# Patient Record
Sex: Male | Born: 1965 | Race: White | Hispanic: No | Marital: Single | Smoking: Current some day smoker
Health system: Southern US, Community
[De-identification: ages and names within clinical notes are randomized; demographics above are authoritative.]

## PROBLEM LIST (undated history)

## (undated) DIAGNOSIS — E785 Hyperlipidemia, unspecified: Secondary | ICD-10-CM

## (undated) DIAGNOSIS — C801 Malignant (primary) neoplasm, unspecified: Secondary | ICD-10-CM

## (undated) DIAGNOSIS — F259 Schizoaffective disorder, unspecified: Secondary | ICD-10-CM

## (undated) DIAGNOSIS — C44321 Squamous cell carcinoma of skin of nose: Secondary | ICD-10-CM

## (undated) HISTORY — PX: SKIN BIOPSY: SHX1

## (undated) HISTORY — DX: Squamous cell carcinoma of skin of nose: C44.321

## (undated) HISTORY — DX: Malignant (primary) neoplasm, unspecified: C80.1

## (undated) HISTORY — DX: Schizoaffective disorder, unspecified: F25.9

## (undated) HISTORY — DX: Hyperlipidemia, unspecified: E78.5

---

## 2008-07-14 ENCOUNTER — Ambulatory Visit: Payer: Self-pay | Admitting: Family Medicine

## 2008-07-14 DIAGNOSIS — F259 Schizoaffective disorder, unspecified: Secondary | ICD-10-CM

## 2008-07-14 HISTORY — DX: Schizoaffective disorder, unspecified: F25.9

## 2008-07-15 LAB — CONVERTED CEMR LAB
CO2: 20 meq/L (ref 19–32)
Calcium: 9.6 mg/dL (ref 8.4–10.5)
Chloride: 107 meq/L (ref 96–112)
Eosinophils Relative: 1 % (ref 0–5)
Glucose, Bld: 84 mg/dL (ref 70–99)
HCT: 44.4 % (ref 39.0–52.0)
Hemoglobin: 15.4 g/dL (ref 13.0–17.0)
Lymphocytes Relative: 19 % (ref 12–46)
Lymphs Abs: 2 10*3/uL (ref 0.7–4.0)
Monocytes Absolute: 0.7 10*3/uL (ref 0.1–1.0)
Neutro Abs: 7.3 10*3/uL (ref 1.7–7.7)
Platelets: 161 10*3/uL (ref 150–400)
Sodium: 141 meq/L (ref 135–145)
TSH: 0.835 microintl units/mL (ref 0.350–4.50)
Total Bilirubin: 0.7 mg/dL (ref 0.3–1.2)
Total Protein: 6.6 g/dL (ref 6.0–8.3)
WBC: 10.1 10*3/uL (ref 4.0–10.5)

## 2008-07-22 ENCOUNTER — Ambulatory Visit: Payer: Self-pay | Admitting: Family Medicine

## 2008-07-24 ENCOUNTER — Ambulatory Visit (HOSPITAL_COMMUNITY): Payer: Self-pay | Admitting: Psychiatry

## 2008-08-13 ENCOUNTER — Telehealth: Payer: Self-pay | Admitting: Family Medicine

## 2008-08-18 ENCOUNTER — Ambulatory Visit (HOSPITAL_COMMUNITY): Payer: Self-pay | Admitting: Psychiatry

## 2008-09-02 ENCOUNTER — Encounter: Payer: Self-pay | Admitting: Family Medicine

## 2008-09-02 ENCOUNTER — Telehealth (INDEPENDENT_AMBULATORY_CARE_PROVIDER_SITE_OTHER): Payer: Self-pay | Admitting: *Deleted

## 2008-10-20 ENCOUNTER — Ambulatory Visit (HOSPITAL_COMMUNITY): Payer: Self-pay | Admitting: Psychiatry

## 2008-10-27 ENCOUNTER — Ambulatory Visit: Payer: Self-pay | Admitting: Family Medicine

## 2008-10-27 DIAGNOSIS — R61 Generalized hyperhidrosis: Secondary | ICD-10-CM

## 2008-10-27 LAB — CONVERTED CEMR LAB: Glucose, Bld: 106 mg/dL

## 2008-10-28 ENCOUNTER — Encounter: Payer: Self-pay | Admitting: Family Medicine

## 2008-12-01 ENCOUNTER — Ambulatory Visit (HOSPITAL_COMMUNITY): Payer: Self-pay | Admitting: Psychology

## 2008-12-15 ENCOUNTER — Ambulatory Visit (HOSPITAL_COMMUNITY): Payer: Self-pay | Admitting: Psychiatry

## 2008-12-22 ENCOUNTER — Ambulatory Visit: Payer: Self-pay | Admitting: Family Medicine

## 2008-12-22 DIAGNOSIS — M546 Pain in thoracic spine: Secondary | ICD-10-CM | POA: Insufficient documentation

## 2008-12-22 DIAGNOSIS — R5381 Other malaise: Secondary | ICD-10-CM

## 2008-12-22 DIAGNOSIS — R5383 Other fatigue: Secondary | ICD-10-CM

## 2008-12-23 ENCOUNTER — Encounter: Payer: Self-pay | Admitting: Family Medicine

## 2008-12-24 LAB — CONVERTED CEMR LAB
Folate: 10.7 ng/mL
Glucose, Bld: 95 mg/dL (ref 70–99)
UIBC: 255 ug/dL
Vitamin B-12: 401 pg/mL (ref 211–911)

## 2008-12-30 ENCOUNTER — Ambulatory Visit (HOSPITAL_COMMUNITY): Payer: Self-pay | Admitting: Psychology

## 2009-02-09 ENCOUNTER — Ambulatory Visit (HOSPITAL_COMMUNITY): Payer: Self-pay | Admitting: Psychiatry

## 2009-02-25 ENCOUNTER — Ambulatory Visit (HOSPITAL_COMMUNITY): Payer: Self-pay | Admitting: Psychology

## 2009-03-22 ENCOUNTER — Ambulatory Visit (HOSPITAL_COMMUNITY): Payer: Self-pay | Admitting: Psychology

## 2009-03-25 ENCOUNTER — Ambulatory Visit (HOSPITAL_COMMUNITY): Payer: Self-pay | Admitting: Psychiatry

## 2009-04-12 ENCOUNTER — Ambulatory Visit (HOSPITAL_COMMUNITY): Payer: Self-pay | Admitting: Psychology

## 2009-04-27 ENCOUNTER — Ambulatory Visit (HOSPITAL_COMMUNITY): Payer: Self-pay | Admitting: Psychology

## 2009-05-26 ENCOUNTER — Ambulatory Visit (HOSPITAL_COMMUNITY): Payer: Self-pay | Admitting: Psychiatry

## 2009-08-19 ENCOUNTER — Ambulatory Visit (HOSPITAL_COMMUNITY): Payer: Self-pay | Admitting: Psychiatry

## 2009-11-18 ENCOUNTER — Ambulatory Visit (HOSPITAL_COMMUNITY): Payer: Self-pay | Admitting: Psychiatry

## 2010-01-20 ENCOUNTER — Ambulatory Visit (HOSPITAL_COMMUNITY): Payer: Self-pay | Admitting: Licensed Clinical Social Worker

## 2010-02-17 ENCOUNTER — Ambulatory Visit (HOSPITAL_COMMUNITY): Payer: Self-pay | Admitting: Psychiatry

## 2010-03-31 ENCOUNTER — Ambulatory Visit (HOSPITAL_COMMUNITY): Payer: Self-pay | Admitting: Psychiatry

## 2010-06-03 ENCOUNTER — Ambulatory Visit (HOSPITAL_COMMUNITY): Payer: Self-pay | Admitting: Psychiatry

## 2010-07-14 ENCOUNTER — Ambulatory Visit: Payer: Self-pay | Admitting: Family Medicine

## 2010-07-14 DIAGNOSIS — T148XXA Other injury of unspecified body region, initial encounter: Secondary | ICD-10-CM | POA: Insufficient documentation

## 2010-08-01 ENCOUNTER — Ambulatory Visit (HOSPITAL_COMMUNITY): Payer: Self-pay | Admitting: Psychiatry

## 2010-10-03 ENCOUNTER — Ambulatory Visit (HOSPITAL_COMMUNITY)
Admission: RE | Admit: 2010-10-03 | Discharge: 2010-10-03 | Payer: Self-pay | Source: Home / Self Care | Attending: Psychiatry | Admitting: Psychiatry

## 2010-10-11 NOTE — Assessment & Plan Note (Signed)
Summary: laceration, minor   Vital Signs:  Patient profile:   45 year old male Height:      70 inches Weight:      200 pounds BMI:     28.80 O2 Sat:      96 % on Room air Temp:     98.7 degrees F oral Pulse rate:   90 / minute BP sitting:   140 / 80  (left arm) Cuff size:   large  Vitals Entered By: Payton Spark CMA (July 14, 2010 11:04 AM)  O2 Flow:  Room air CC: Small laceration from hedge trimmers on ear. Update Tdap.   Primary Care Provider:  [name]  CC:  Small laceration from hedge trimmers on ear. Update Tdap.Ronald [name]  History of Present Illness: 45 yo WM presents for a small laceration on the R ear that occured yesterday.  He was at home and he moved a ladder and his electric hedge trimmer fell on his R ear.  He had bleeding for about 20 min.  Used compression and the bleeding subsided.  He denies any pain, pus or bleeding.  It has been > 10 yrs since his last tetanus shot.  Current Medications (verified): 1)  Saphris 5 Mg Subl (Asenapine Maleate) .Ronald [name].. 1 By Mouth Bid 2)  Fluoxetine Hcl 20 Mg Caps (Fluoxetine Hcl) .... Take 3 Caps By Mouth Once Daily  Allergies (verified): No Known Drug Allergies  Past History:  Past Medical History: Reviewed history from 07/14/2008 and no changes required. None  Social History: Reviewed history from 07/14/2008 and no changes required. Ronald [name] for about 1.5 years. MBA degree. Single.  Lives with his mother.  Current Smoker Alcohol use-yes Drug use-no Regular exercise-yes  Review of Systems      See HPI  Physical Exam  General:  alert, well-developed, well-nourished, and well-hydrated.   Head:  normocephalic and atraumatic.   Ears:  small well approximated laceration over the R superior anti helix.  No bleeding, edema, purulent drainage.   Impression & Recommendations:  Problem # 1:  LACERATION (ICD-879.8) R anti helix (ear) laceration, small, occured yesterday without need for suturre.  Appears  clean today. Tdap given today. Wound care instructions given (see pt h/o info) and advised to call if any sign of infection.  Complete Medication List: 1)  Saphris 5 Mg Subl (Asenapine maleate) .Ronald [name].. 1 by mouth bid 2)  Fluoxetine Hcl 20 Mg Caps (Fluoxetine hcl) .... Take 3 caps by mouth once daily  Other Orders: Tdap => 47yrs IM (47829) Admin 1st Vaccine (56213)  Patient Instructions: 1)  Tdap updated today. 2)  Keep wound clean - antibacterial soap and water 1-2 x a day. 3)  Use topical neosporin ointment 2 x a day. 4)  Call if any fevers, redness, pus, pain, swelling occurs.   Orders Added: 1)  Est. Patient Level II [08657] 2)  Tdap => 10yrs IM [90715] 3)  Admin 1st Vaccine [84696]   Immunizations Administered:  Tetanus Vaccine:    Vaccine Type: Tdap    Site: left deltoid    Dose: 0.5 ml    Route: IM    Given by: Payton Spark CMA    Exp. Date: 06/30/2012    Lot #: [id number]    VIS given: 07/29/08 version given July 14, 2010.   Immunizations Administered:  Tetanus Vaccine:    Vaccine Type: Tdap    Site: left deltoid    Dose: 0.5 ml    Route: IM  Given by: Payton Spark CMA    Exp. Date: 06/30/2012    Lot #: [id number]    VIS given: 07/29/08 version given July 14, 2010.

## 2011-01-02 ENCOUNTER — Encounter (INDEPENDENT_AMBULATORY_CARE_PROVIDER_SITE_OTHER): Payer: Self-pay | Admitting: Psychiatry

## 2011-01-02 DIAGNOSIS — F259 Schizoaffective disorder, unspecified: Secondary | ICD-10-CM

## 2011-01-10 ENCOUNTER — Ambulatory Visit (INDEPENDENT_AMBULATORY_CARE_PROVIDER_SITE_OTHER): Payer: Self-pay | Admitting: Psychology

## 2011-01-10 DIAGNOSIS — F259 Schizoaffective disorder, unspecified: Secondary | ICD-10-CM

## 2011-01-25 ENCOUNTER — Encounter (HOSPITAL_COMMUNITY): Payer: Self-pay | Admitting: Psychology

## 2011-02-01 ENCOUNTER — Encounter (HOSPITAL_COMMUNITY): Payer: Self-pay | Admitting: Psychology

## 2011-02-13 ENCOUNTER — Encounter (HOSPITAL_COMMUNITY): Payer: Self-pay | Admitting: Psychology

## 2011-04-03 ENCOUNTER — Encounter (HOSPITAL_COMMUNITY): Payer: Self-pay | Admitting: Psychiatry

## 2011-04-20 ENCOUNTER — Encounter (HOSPITAL_COMMUNITY): Payer: Self-pay | Admitting: Psychiatry

## 2011-05-03 ENCOUNTER — Encounter (INDEPENDENT_AMBULATORY_CARE_PROVIDER_SITE_OTHER): Payer: Self-pay | Admitting: Psychiatry

## 2011-05-03 DIAGNOSIS — F259 Schizoaffective disorder, unspecified: Secondary | ICD-10-CM

## 2011-06-28 ENCOUNTER — Encounter (HOSPITAL_COMMUNITY): Payer: Self-pay

## 2011-08-02 ENCOUNTER — Encounter (HOSPITAL_COMMUNITY): Payer: Self-pay | Admitting: Psychiatry

## 2011-08-02 ENCOUNTER — Ambulatory Visit (HOSPITAL_COMMUNITY): Payer: Self-pay | Admitting: Psychiatry

## 2011-08-02 DIAGNOSIS — F259 Schizoaffective disorder, unspecified: Secondary | ICD-10-CM

## 2011-08-02 MED ORDER — ASENAPINE MALEATE 5 MG SL SUBL: 5.0000 mg | SUBLINGUAL_TABLET | Freq: Two times a day (BID) | SUBLINGUAL | Status: DC

## 2011-08-02 NOTE — Progress Notes (Signed)
   [facility] Behavioral Health Follow-up Outpatient Visit  Ronald [name] 09-09-1966   Subjective: The patient is a 45 year old male who has been followed by [facility] since November of 2009. He is currently diagnosed with schizoaffective disorder. Current medications include Saphris and Prozac. The patient still continues to look for work. He applied it one bank but was rejected the next day. The patient wonders if this relates to a DUI that he got approximately 20 years ago. The patient has been working on a paper related to the [employer]. He is hoping to get this publish at some point. 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 And would like to school to do this. The patient denies any anxiety. He says that his thoughts are making sense. He is up 10 pounds since last visit. He feels like his crept up on him very quickly. He endorses good sleep and appetite. He is asking about the vaporizer cigarette substitution to get for his mother for Christmas.  Filed Vitals:   08/02/11 1026  BP: 138/84    Mental Status Examination  Appearance: Casually dressed Alert: Yes Attention: fair  Cooperative: Yes Eye Contact: Good Speech: Rather monotone Psychomotor Activity: Normal Memory/Concentration: Intact Oriented: person, place, time/date and situation Mood: Euthymic Affect: Restricted Thought Processes and Associations: Logical Fund of Knowledge: Fair Thought Content: No suicidal or homicidal thoughts Insight: Fair Judgement: Fair  Diagnosis: Schizoaffective disorder  Treatment Plan: At this point we will continue the patient on his Saphris and Prozac. I have given him a voucher for a one-month supply free. Pharmacy is to fax me when he needs more Prozac. I will see him back in 3 months.  Jamse Mead, MD

## 2011-11-02 ENCOUNTER — Encounter (HOSPITAL_COMMUNITY): Payer: Self-pay | Admitting: Psychiatry

## 2011-11-02 ENCOUNTER — Ambulatory Visit (INDEPENDENT_AMBULATORY_CARE_PROVIDER_SITE_OTHER): Payer: Self-pay | Admitting: Psychiatry

## 2011-11-02 DIAGNOSIS — F411 Generalized anxiety disorder: Secondary | ICD-10-CM

## 2011-11-02 DIAGNOSIS — F259 Schizoaffective disorder, unspecified: Secondary | ICD-10-CM

## 2011-11-02 NOTE — Progress Notes (Signed)
   [facility] Behavioral Health Follow-up Outpatient Visit  Ronald [name] 02-01-1966   Subjective: The patient is a 46 year old male who has been followed by [facility] since November of 2009. He is currently diagnosed with schizoaffective disorder. Current medications include Saphris and Prozac. The patient still continues to look for work. He feels like he is developing skills by playing the investment game that he is using online. 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 He checks on a daily. The patient reports he uses a special formula but only takes one to 2 minutes. He spends a lot of time reading online. He said he met during the gym. He is up another 2 pounds today. The patient continues to have night sweats. He will wake up in a cold sweat. He denies any bad dreams. He feels that his anxiety is doing well. He is more positive. His thoughts are making sense. He has not done much more with vocational rehabilitation, although they are still in touch. He is complaining of occasional bouts of indigestion.  Filed Vitals:   11/02/11 1047  BP: 140/90    Mental Status Examination  Appearance: Casually dressed Alert: Yes Attention: fair  Cooperative: Yes Eye Contact: Good Speech: Rather monotone Psychomotor Activity: Normal Memory/Concentration: Intact Oriented: person, place, time/date and situation Mood: Euthymic Affect: Restricted Thought Processes and Associations: Logical Fund of Knowledge: Fair Thought Content: No suicidal or homicidal thoughts Insight: Fair Judgement: Fair  Diagnosis: Schizoaffective disorder  Treatment Plan:  We will decrease his Prozac to 40 mg a day to see if this helps with the night sweats. We will continue his Saphris. I will see him back in 2 months.  Jamse Mead, MD

## 2011-12-06 ENCOUNTER — Other Ambulatory Visit (HOSPITAL_COMMUNITY): Payer: Self-pay | Admitting: Psychiatry

## 2011-12-06 MED ORDER — ASENAPINE MALEATE 5 MG SL SUBL: 5.0000 mg | SUBLINGUAL_TABLET | Freq: Two times a day (BID) | SUBLINGUAL | Status: DC

## 2011-12-28 ENCOUNTER — Telehealth (HOSPITAL_COMMUNITY): Payer: Self-pay

## 2012-01-02 ENCOUNTER — Ambulatory Visit (INDEPENDENT_AMBULATORY_CARE_PROVIDER_SITE_OTHER): Payer: Self-pay | Admitting: Psychiatry

## 2012-01-02 ENCOUNTER — Encounter (HOSPITAL_COMMUNITY): Payer: Self-pay | Admitting: Psychiatry

## 2012-01-02 VITALS — BP 121/66 | Ht 70.0 in | Wt 213.0 lb

## 2012-01-02 NOTE — Progress Notes (Signed)
   [facility] Behavioral Health Follow-up Outpatient Visit  Ronald [name] 03-12-1966   Subjective: The patient is a 46 year old male who has been followed by [facility] since November of 2009. He is currently diagnosed with schizoaffective disorder. Current medications include Saphris and Prozac. The patient still continues to look for work. 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 He is down 1 pound today. He reports his been cutting back on the calories, but doesn't seem to be losing weight. He is social with his family, but has no friends. He does walk the dog at night. He is thinking about adding morning walks to see if that helps. He was in a position he was going to apply for, but states that he is too heavy to fit in his seat. I suggested that he go to good will to look for suits. He is doing well with the decrease in Prozac. He feels that his thoughts are making sense. We further discussed his called for jury duty. I advised the patient that if this occued prior to medication, I would have definite concerns. If he starts to have doubts before May second, he is to call me. At that point I will write him a letter excusing him from jury duty. Filed Vitals:   01/02/12 1120  BP: 121/66    Mental Status Examination  Appearance: Casually dressed Alert: Yes Attention: fair  Cooperative: Yes Eye Contact: Good Speech: Rather monotone Psychomotor Activity: Normal Memory/Concentration: Intact Oriented: person, place, time/date and situation Mood: Euthymic Affect: Restricted Thought Processes and Associations: Logical Fund of Knowledge: Fair Thought Content: No suicidal or homicidal thoughts Insight: Fair Judgement: Fair  Diagnosis: Schizoaffective disorder  Treatment Plan: We'll not make any changes. I will see him back in 3 months.  Jamse Mead, MD

## 2012-01-02 NOTE — Telephone Encounter (Signed)
Patient is interested in pursuing jury duty. I believe it would get him out of the house. I don't have any concerns.

## 2012-01-25 ENCOUNTER — Telehealth (HOSPITAL_COMMUNITY): Payer: Self-pay

## 2012-01-26 ENCOUNTER — Other Ambulatory Visit (HOSPITAL_COMMUNITY): Payer: Self-pay | Admitting: Psychiatry

## 2012-01-26 MED ORDER — ASENAPINE MALEATE 5 MG SL SUBL: 5.0000 mg | SUBLINGUAL_TABLET | Freq: Two times a day (BID) | SUBLINGUAL | Status: DC

## 2012-01-26 NOTE — Telephone Encounter (Signed)
Spoke to [place] in pharmacy. She is obtaining medication. I will call patient limits available.

## 2012-04-02 ENCOUNTER — Ambulatory Visit (INDEPENDENT_AMBULATORY_CARE_PROVIDER_SITE_OTHER): Payer: Self-pay | Admitting: Psychiatry

## 2012-04-02 ENCOUNTER — Encounter (HOSPITAL_COMMUNITY): Payer: Self-pay | Admitting: Psychiatry

## 2012-04-02 VITALS — BP 138/90 | Ht 70.0 in | Wt 214.0 lb

## 2012-04-02 DIAGNOSIS — F259 Schizoaffective disorder, unspecified: Secondary | ICD-10-CM

## 2012-04-02 NOTE — Progress Notes (Signed)
   [facility] Behavioral Health Follow-up Outpatient Visit  Ronald [name] 06-Jun-1966    Subjective: The patient is a 46 year old male who has been followed by [facility] since November of 2009. He is currently diagnosed with schizoaffective disorder. Current medications include Saphris and Prozac. At last appointment, I did not make any changes. The patient presents today. He ended up being excused from jury duty, so he did not have to worry. 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 He is thinking that when it is published, he will be able to get a job in [employer]. He sees his sister on weekends. He has not had any more night sweats. He first reports he thinks his thoughts are doing well, but then reports some of his thoughts. He states that he is not sure whether or not they're fact or fiction. He feels that someone informed him that he got a substantial number of votes in the 1992 presidential election. He states he has no way to verify this. 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 He reports that his mind plays tricks and will link together into a conspiracy theory. He to fill the holes in his thoughts. He states that he has no one to talk to, which makes things more difficult. He is asking for a therapist.        Ceasar Mons Vitals:   04/02/12 1118  BP: 138/90    Mental Status Examination  Appearance: Casually dressed Alert: Yes Attention: fair  Cooperative: Yes Eye Contact: Good Speech: Rather monotone Psychomotor Activity: Normal Memory/Concentration: Intact Oriented: person, place, time/date and situation Mood: Euthymic Affect: Restricted Thought Processes and Associations: Logical Fund of Knowledge: Fair Thought Content: No suicidal or homicidal thoughts Insight: Fair Judgement: Fair  Diagnosis: Schizoaffective disorder  Treatment Plan: We'll not make any  changes. I will see him back in 3 months. Patient is reapplying for financial assistance. I will schedule him with Tasia Catchings once this was completed.  Jamse Mead, MD

## 2012-04-16 ENCOUNTER — Other Ambulatory Visit (HOSPITAL_COMMUNITY): Payer: Self-pay

## 2012-04-16 NOTE — Telephone Encounter (Signed)
Pharmacy will be sending 100 count. Will call patient when they're ready.

## 2012-04-23 ENCOUNTER — Other Ambulatory Visit (HOSPITAL_COMMUNITY): Payer: Self-pay | Admitting: Psychiatry

## 2012-04-23 MED ORDER — ASENAPINE MALEATE 5 MG SL SUBL: 5.0000 mg | SUBLINGUAL_TABLET | Freq: Two times a day (BID) | SUBLINGUAL | Status: DC

## 2012-06-06 ENCOUNTER — Other Ambulatory Visit (HOSPITAL_COMMUNITY): Payer: Self-pay

## 2012-06-07 ENCOUNTER — Telehealth (HOSPITAL_COMMUNITY): Payer: Self-pay | Admitting: Psychiatry

## 2012-06-07 NOTE — Telephone Encounter (Signed)
The patient reports he is doing well on his current combination of medications. He denies any SI/HI/AVH or medication side effects. He states he has enough medications to go past the weekend.  PLAN: Will call patient when samples of Saphris arrive.

## 2012-06-10 MED ORDER — ASENAPINE MALEATE 5 MG SL SUBL: 5.0000 mg | SUBLINGUAL_TABLET | Freq: Two times a day (BID) | SUBLINGUAL | Status: DC

## 2012-06-10 NOTE — Telephone Encounter (Signed)
Medication obtained. We'll notify patient.

## 2012-07-03 ENCOUNTER — Encounter (HOSPITAL_COMMUNITY): Payer: Self-pay | Admitting: Psychiatry

## 2012-07-03 ENCOUNTER — Ambulatory Visit (INDEPENDENT_AMBULATORY_CARE_PROVIDER_SITE_OTHER): Payer: Self-pay | Admitting: Psychiatry

## 2012-07-03 VITALS — BP 136/85 | Ht 70.0 in | Wt 215.0 lb

## 2012-07-03 DIAGNOSIS — F259 Schizoaffective disorder, unspecified: Secondary | ICD-10-CM

## 2012-07-03 MED ORDER — FLUOXETINE HCL 20 MG PO CAPS: 40.0000 mg | ORAL_CAPSULE | Freq: Every day | ORAL | Status: DC

## 2012-07-03 NOTE — Progress Notes (Signed)
   [facility] Behavioral Health Follow-up Outpatient Visit  Ronald [name] 1966/07/20    Subjective: The patient is a 46 year old male who has been followed by [facility] since November of 2009. He is currently diagnosed with schizoaffective disorder. Current medications include Saphris and Prozac. At last appointment, I did not make any changes. He presents today. He still has not completed his application for financial assistance. He continues not to work. 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 He doesn't feel he can do this, because he obtained the information he used from a website. He doesn't think he can financially profit from information provided by outside sources. 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 He wants to look at jobs. He is found a couple of positions at [employer]. 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 He denies having any bizarre thoughts. He started smoking again, but quit again soon after. He is concerned about his weight being up. He is up 1 pound today. He feels that his anxiety is under control. He's thought about going off his Prozac secondary to cost. I have discouraged this. I continue to provide samples of his antipsychotic.       Filed Vitals:   07/03/12 1204  BP: 136/85    Mental Status Examination  Appearance: Casually dressed Alert: Yes Attention: fair  Cooperative: Yes Eye Contact: Good Speech: Rather monotone Psychomotor Activity: Normal Memory/Concentration: Intact Oriented: person, place, time/date and situation Mood: Euthymic Affect: Restricted Thought Processes and Associations: Logical Fund of Knowledge: Fair Thought Content: No suicidal or homicidal thoughts Insight: Fair Judgement: Fair  Diagnosis: Schizoaffective disorder  Treatment Plan: We'll not make any  changes. I will see him back in 3 months. Patient may call with concerns.  Jamse Mead, MD

## 2012-07-30 ENCOUNTER — Other Ambulatory Visit (HOSPITAL_COMMUNITY): Payer: Self-pay | Admitting: Psychiatry

## 2012-07-30 MED ORDER — ASENAPINE MALEATE 5 MG SL SUBL: 5.0000 mg | SUBLINGUAL_TABLET | Freq: Two times a day (BID) | SUBLINGUAL | Status: DC

## 2012-09-09 ENCOUNTER — Telehealth (HOSPITAL_COMMUNITY): Payer: Self-pay

## 2012-09-10 NOTE — Telephone Encounter (Signed)
Have contacted pharmacy. Will pickup in the morning.

## 2012-09-13 ENCOUNTER — Other Ambulatory Visit (HOSPITAL_COMMUNITY): Payer: Self-pay | Admitting: Psychiatry

## 2012-09-13 MED ORDER — ASENAPINE MALEATE 5 MG SL SUBL: 5.0000 mg | SUBLINGUAL_TABLET | Freq: Two times a day (BID) | SUBLINGUAL | Status: DC

## 2012-10-03 ENCOUNTER — Ambulatory Visit (INDEPENDENT_AMBULATORY_CARE_PROVIDER_SITE_OTHER): Payer: Self-pay | Admitting: Psychiatry

## 2012-10-03 ENCOUNTER — Encounter (HOSPITAL_COMMUNITY): Payer: Self-pay | Admitting: Psychiatry

## 2012-10-03 VITALS — BP 128/82 | Ht 70.0 in | Wt 209.0 lb

## 2012-10-03 DIAGNOSIS — F259 Schizoaffective disorder, unspecified: Secondary | ICD-10-CM

## 2012-10-03 NOTE — Progress Notes (Signed)
   [facility] Behavioral Health Follow-up Outpatient Visit  ORY ELTING 1966-02-08  Subjective: The patient is a 47 year old male who has been followed by [facility] since November of 2009. He is diagnosed with cues of affective disorder. He has been stable on Saphris and Prozac. At his last appointment, I did not make any changes. He presents today. He is smoking again. He is smoking his mother cigarettes, which makes her very unhappy. He is down 6 pounds. He is up to 16 cigarettes a day. He reports his paper regarding his financial formula is almost completed. He plans on sending it to [employer]. He called someone and got Elissa Hefty e-mail address. The patient has tried for a few financial positions. He was screened over the phone and not given an interview. 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 He endorses good sleep. There has been some mild anxiety. He still has not filled out his financial paperwork. He has an area on the left side of his nose that he has been a look at. 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 It has been there since the summer. He reports that he has not had it looked at by his primary care physician. He has not seen his primary care physician in over 2 years. He still has not filled out his financial paperwork for financial assistance. The patient believes his thoughts are making sense to him. Appetite is down secondary to smoking.    Filed Vitals:   10/03/12 1122  BP: 128/82    Mental Status Examination  Appearance: Casually dressed Alert: Yes Attention: fair  Cooperative: Yes Eye Contact: Good Speech: Rather monotone Psychomotor Activity: Normal Memory/Concentration: Intact Oriented: person, place, time/date and situation Mood: Euthymic Affect: Restricted Thought Processes and Associations: Logical Fund of Knowledge: Fair Thought Content: No suicidal or homicidal thoughts Insight:  Fair Judgement: Fair  Diagnosis: Schizoaffective disorder  Treatment Plan:I will not make any changes. I will see him back in 2 months. Patient may call with concerns. Patient is to fill out paperwork for assistance. I have advised that he needs to have the area on his nose examed.  Jamse Mead, MD

## 2012-10-21 ENCOUNTER — Telehealth (HOSPITAL_COMMUNITY): Payer: Self-pay

## 2012-10-21 NOTE — Telephone Encounter (Signed)
NEEDS SAPHRIS

## 2012-10-21 NOTE — Telephone Encounter (Signed)
I have spoken to [facility] in pharmacy who will send samples. We'll notify patient when they are available.

## 2012-10-30 ENCOUNTER — Telehealth (HOSPITAL_COMMUNITY): Payer: Self-pay

## 2012-10-30 NOTE — Telephone Encounter (Signed)
Called patient. Patient will pick up tomorrow.

## 2012-10-30 NOTE — Telephone Encounter (Signed)
Spoke to pharmacy. Sydell Axon will deliver it today.

## 2012-10-31 ENCOUNTER — Other Ambulatory Visit (HOSPITAL_COMMUNITY): Payer: Self-pay | Admitting: Psychiatry

## 2012-10-31 MED ORDER — ASENAPINE MALEATE 5 MG SL SUBL: 5.0000 mg | SUBLINGUAL_TABLET | Freq: Two times a day (BID) | SUBLINGUAL | Status: DC

## 2012-12-02 ENCOUNTER — Encounter (HOSPITAL_COMMUNITY): Payer: Self-pay | Admitting: Psychiatry

## 2012-12-02 ENCOUNTER — Ambulatory Visit (INDEPENDENT_AMBULATORY_CARE_PROVIDER_SITE_OTHER): Payer: Self-pay | Admitting: Psychiatry

## 2012-12-02 VITALS — BP 125/85 | Ht 70.0 in | Wt 206.0 lb

## 2012-12-02 DIAGNOSIS — F411 Generalized anxiety disorder: Secondary | ICD-10-CM

## 2012-12-02 DIAGNOSIS — F259 Schizoaffective disorder, unspecified: Secondary | ICD-10-CM

## 2012-12-02 MED ORDER — FLUOXETINE HCL 20 MG PO CAPS: 40.0000 mg | ORAL_CAPSULE | Freq: Every day | ORAL | Status: DC

## 2012-12-02 NOTE — Progress Notes (Signed)
   [facility] Behavioral Health Follow-up Outpatient Visit  JERIC SLAGEL Jan 18, 1966   Subjective: The patient is a 47 year old male who has been followed by [facility] since November of 2009. He is currently diagnosed with schizoaffective disorder. He has been stable on Saphris and Prozac. He presents today. He continues to smoke tobacco. He is down 3 pounds. He is smoking his mother cigarettes. She is eager for him to find something to do. She is putting pressure on him. The patient never heard back regarding the position at [employer] in the [employer]. He plans on reapplying. The patient has been staying to himself. He is very concerned about vocational rehabilitation. He has not been there for over 2 years. At his last appointment with them, they wanted him to fill out paperwork so he could have a computer at his house. This would enable him to work from home. He became concerned and paranoid that charge him if he did not get a job. He is paranoid now about going back. He is afraid that they will take out charges against him because he has not returned. The majority of the appointment to spend and reassurance. The patient never heard back about his financial plan. He is still trying to contact musicians to look for prior money that is a owed. Filed Vitals:   12/02/12 1111  BP: 125/85    Mental Status Examination  Appearance: Casually dressed Alert: Yes Attention: good  Cooperative: Yes Eye Contact: Good Speech: Regular rate rhythm and volume Psychomotor Activity: Normal Memory/Concentration: Intact Oriented: person, place, time/date and situation Mood: Euthymic Affect: Constricted Thought Processes and Associations: Linear Fund of Knowledge: Fair Thought Content: No suicidal or homicidal thoughts Insight: Fair Judgement: Fair  Diagnosis: Schizoaffective disorder  Treatment Plan: I will continue his Prozac and Saphris. I will see him back in 2  months. Patient is to be in touch with vocational rehabilitation and have his nose examined prior to next appointment.  Jamse Mead, MD

## 2012-12-23 ENCOUNTER — Other Ambulatory Visit (HOSPITAL_COMMUNITY): Payer: Self-pay | Admitting: Psychiatry

## 2012-12-23 MED ORDER — ASENAPINE MALEATE 5 MG SL SUBL: 5.0000 mg | SUBLINGUAL_TABLET | Freq: Two times a day (BID) | SUBLINGUAL | Status: DC

## 2013-02-04 ENCOUNTER — Ambulatory Visit (INDEPENDENT_AMBULATORY_CARE_PROVIDER_SITE_OTHER): Payer: Self-pay | Admitting: Psychiatry

## 2013-02-04 ENCOUNTER — Encounter (HOSPITAL_COMMUNITY): Payer: Self-pay | Admitting: Psychiatry

## 2013-02-04 VITALS — BP 124/85 | Ht 70.0 in | Wt 204.0 lb

## 2013-02-04 DIAGNOSIS — F411 Generalized anxiety disorder: Secondary | ICD-10-CM

## 2013-02-04 DIAGNOSIS — F259 Schizoaffective disorder, unspecified: Secondary | ICD-10-CM

## 2013-02-04 NOTE — Progress Notes (Signed)
   [facility] Behavioral Health Follow-up Outpatient Visit  Ronald [name] 1965/10/21   Subjective: The patient is a 47 year old male who has been followed by [facility] since November of 2009. He is currently diagnosed with schizoaffective disorder. He has been stable on Saphris and Prozac. He presents today. He continues to smoke tobacco. He is down 2 more pounds. He is smoking his mother cigarettes. She is eager for him to find something to do. The patient has not been back in touch with vocational rehabilitation. 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 He may try to make $10 at each paper. He is planning on giving away copies to get people talking about it. The patient feels that he's more organized. The patient's normal he sleeping well. His had poor sleep the last few nights. His anxiety is doing well. He feels that he has better control of his peers. He has applied for assistance for his co-pay, but has not heard anything. He would like a primary care physician. I have suggested that once he qualifies, he speak to cone about primary care physician. He still needs his nose examined. Filed Vitals:   02/04/13 1052  BP: 124/85   Active Ambulatory Problems    Diagnosis Date Noted  . Schizoaffective disorder 07/14/2008  . BACK PAIN, THORACIC REGION 12/22/2008  . FATIGUE 12/22/2008  . NIGHT SWEATS 10/27/2008  . LACERATION 07/14/2010   Resolved Ambulatory Problems    Diagnosis Date Noted  . No Resolved Ambulatory Problems   No Additional Past Medical History   Current Outpatient Prescriptions on File Prior to Visit  Medication Sig Dispense Refill  . asenapine (SAPHRIS) 5 MG SUBL Place 1 tablet (5 mg total) under the tongue 2 (two) times daily. Samples provided  100 tablet  0  . FLUoxetine (PROZAC) 20 MG capsule Take 2 capsules (40 mg total) by mouth daily.  60 capsule  5   No current facility-administered medications on file  prior to visit.   Review of Systems - General ROS: negative for - sleep disturbance or weight gain Psychological ROS: negative for - anxiety, depression or hallucinations Cardiovascular ROS: no chest pain or dyspnea on exertion Musculoskeletal ROS: negative for - gait disturbance or muscular weakness Neurological ROS: negative for - dizziness, headaches or seizures   Mental Status Examination  Appearance: Casually dressed Alert: Yes Attention: good  Cooperative: Yes Eye Contact: Good Speech: Regular rate rhythm and volume Psychomotor Activity: Normal Memory/Concentration: Intact Oriented: person, place, time/date and situation Mood: Euthymic Affect: Constricted Thought Processes and Associations: Linear Fund of Knowledge: Fair Thought Content: No suicidal or homicidal thoughts Insight: Fair Judgement: Fair  Diagnosis: Schizoaffective disorder  Treatment Plan: I will continue his Prozac and Saphris. I will see him back in 3 months. Patient may call with concerns. Jamse Mead, MD

## 2013-05-07 ENCOUNTER — Encounter (HOSPITAL_COMMUNITY): Payer: Self-pay | Admitting: Psychiatry

## 2013-05-07 ENCOUNTER — Ambulatory Visit (INDEPENDENT_AMBULATORY_CARE_PROVIDER_SITE_OTHER): Payer: Self-pay | Admitting: Psychiatry

## 2013-05-07 VITALS — BP 122/80 | Ht 70.0 in | Wt 204.0 lb

## 2013-05-07 DIAGNOSIS — F259 Schizoaffective disorder, unspecified: Secondary | ICD-10-CM

## 2013-05-07 NOTE — Progress Notes (Signed)
   [facility] Follow-up Outpatient Visit  Ronald [name] 06-28-1966   Subjective: The patient is a 47 year old male who has been followed by [facility] since November of 2009. He is currently diagnosed with schizoaffective disorder. At his last appointment, I did not make any changes. He presents today. 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 Once is completed, he may recontact [employer] and see if they will reconsider his application. The patient continues to smoke his mother cigarettes. He has a dental appointment next week and plans to quit afterwards. He reports a dental office send him a gift certificate for cleaning. He still does not have a primary care physician. He continues to monitor the area on the side of his nose. He feels it may be slightly bigger. The patient reports his thoughts are making sense. He confides in me that he has been off his Prozac for approximately one year. He was feeling better and have less anxiety with the distraction of working on the paper. He feels that he's doing well and his anxiety is under control. He does continue to take the Saphris.  Weight is the same. He is sleeping well. Filed Vitals:   05/07/13 1031  BP: 122/80   Active Ambulatory Problems    Diagnosis Date Noted  . Schizoaffective disorder 07/14/2008  . BACK PAIN, THORACIC REGION 12/22/2008  . FATIGUE 12/22/2008  . NIGHT SWEATS 10/27/2008  . LACERATION 07/14/2010   Resolved Ambulatory Problems    Diagnosis Date Noted  . No Resolved Ambulatory Problems   No Additional Past Medical History   Current Outpatient Prescriptions on File Prior to Visit  Medication Sig Dispense Refill  . asenapine (SAPHRIS) 5 MG SUBL Place 1 tablet (5 mg total) under the tongue 2 (two) times daily. Samples provided  100 tablet  0  . FLUoxetine (PROZAC) 20 MG capsule Take 2 capsules (40 mg total) by mouth daily.  60 capsule  5   No current  facility-administered medications on file prior to visit.   Review of Systems - General ROS: negative for - sleep disturbance or weight gain Psychological ROS: negative for - anxiety, depression or hallucinations Cardiovascular ROS: no chest pain or dyspnea on exertion Musculoskeletal ROS: negative for - gait disturbance or muscular weakness Neurological ROS: negative for - dizziness, headaches or seizures   Mental Status Examination  Appearance: Casually dressed Alert: Yes Attention: good  Cooperative: Yes Eye Contact: Good Speech: Regular rate rhythm and volume Psychomotor Activity: Normal Memory/Concentration: Intact Oriented: person, place, time/date and situation Mood: Euthymic Affect: Constricted Thought Processes and Associations: Linear Fund of Knowledge: Fair Thought Content: No suicidal or homicidal thoughts Insight: Fair Judgement: Fair  Diagnosis: Schizoaffective disorder  Treatment Plan:  I will discontinue the Prozac since patient is been noncompliant. I will continue the Saphris. I will see the patient back in 3 months. Patient may call with concerns. Jamse Mead, MD

## 2013-06-24 ENCOUNTER — Telehealth (HOSPITAL_COMMUNITY): Payer: Self-pay

## 2013-06-24 NOTE — Telephone Encounter (Signed)
Called the pharmacy to request Saphris samples for this patient.  Spoke with the [facility] pharmacist she will send samples to the clinic.

## 2013-07-11 ENCOUNTER — Other Ambulatory Visit (HOSPITAL_COMMUNITY): Payer: Self-pay | Admitting: Psychiatry

## 2013-07-11 MED ORDER — ASENAPINE MALEATE 5 MG SL SUBL: 5.0000 mg | SUBLINGUAL_TABLET | Freq: Two times a day (BID) | SUBLINGUAL | Status: DC

## 2013-08-11 ENCOUNTER — Ambulatory Visit (HOSPITAL_COMMUNITY): Payer: Self-pay | Admitting: Psychiatry

## 2013-08-12 ENCOUNTER — Ambulatory Visit (HOSPITAL_COMMUNITY): Payer: Self-pay | Admitting: Psychiatry

## 2013-08-15 ENCOUNTER — Ambulatory Visit (INDEPENDENT_AMBULATORY_CARE_PROVIDER_SITE_OTHER): Payer: Self-pay | Admitting: Family Medicine

## 2013-08-15 VITALS — BP 159/83 | HR 124 | Temp 98.4°F | Ht 70.0 in

## 2013-08-15 DIAGNOSIS — L989 Disorder of the skin and subcutaneous tissue, unspecified: Secondary | ICD-10-CM

## 2013-08-15 NOTE — Progress Notes (Signed)
   Subjective:    Patient ID: Ronald [name], male    DOB: Dec 31, 1965, 47 y.o.   MRN: [id number]  HPI Sore on the left side of his nose since July of 2013.  Initially started as a small nodule. Eventually became raised and crusty by October of 2013. He says that otherwise doesn't really bother him. It's not painful or itchy. He says it really doesn't bleed. He has not been using any treatments on it.   Review of Systems     Objective:   Physical Exam  Constitutional: He appears well-developed and well-nourished.  HENT:  Head:    Skin:  approx  1 cm crusting lesion on the left lateral side of nose with a crater in the middle.            Assessment & Plan:  Skin lesion on the left side of the nose-very suspicious for squamous cell or basal cell. Because of the size and depth of the lesion I really think he needs to see a dermatologist and maybe even a Mohs surgeon for removal to have the best cosmetic outcome.

## 2013-08-18 ENCOUNTER — Encounter (HOSPITAL_COMMUNITY): Payer: Self-pay | Admitting: Psychiatry

## 2013-08-18 ENCOUNTER — Ambulatory Visit (INDEPENDENT_AMBULATORY_CARE_PROVIDER_SITE_OTHER): Payer: Self-pay | Admitting: Psychiatry

## 2013-08-18 VITALS — BP 129/91 | HR 98 | Ht 70.0 in | Wt 204.0 lb

## 2013-08-18 DIAGNOSIS — F259 Schizoaffective disorder, unspecified: Secondary | ICD-10-CM

## 2013-08-18 NOTE — Progress Notes (Signed)
[facility] [id number] Progress Note  Ronald [name] [id number] 47 y.o.  08/18/2013 10:12 AM  Chief Complaint:  HPI Comments: Ronald [name] is  a 47 y/o male with a past psychiatric history significant for Schizoaffective Disorder. The patient is referred for psychiatric services formedication management.    . Location: The patient reports he has been doing well with his current medications. He is currently receiving Sapharis samples through [facility]. . Quality:  The patient reports that his  main stressors are:  "unemployment"-he is hoping to find a job.  The patient reports that he first came to Dr. Christell Constant around 2011, secondary to paranoia and delusions. He endorses unemployment and financial strains along with poor diet lead to depression with poor sleep, anhedonia, and paranoid ideation.  He reports no previous episodes of psychosis prior to 2011, and believes some of the symptoms were related to his situation (being homeless, inadequate diet, and lack of sleep.)  In the area of affective symptoms, patient appears anxious. Patient denies current suicidal ideation, intent, or plan. Patient denies current homicidal ideation, intent, or plan. Patient denies auditory hallucinations. Patient denies visual hallucinations. Patient denies symptoms of paranoia, but endorses paranoia in the past about people "get him" he is not really sure why. Patient states sleep is good, with approximately 8 hours of sleep per night. Appetite is good. Energy level is fair. Patient denies symptoms of anhedonia. Patient denies hopelessness, helplessness, or guilt.   . Severity: Depression: 6-7/10 (0=Very depressed; 5=Neutral; 10=Very Happy)  Anxiety- 6/10 (0=no anxiety; 5= moderate/tolerable anxiety; 10= panic attacks)  . Duration: Lost job in 2007, mood worsened to 2011 leading to a episode of paranoia in which he called the police and accused them of hypnotizing.   . Timing: No specific time.  .  Context: None currently, in the past worrying about employment.  . Modifying factors: Working on his research paper.   . Associated signs and symptoms: As noted below.   History of Present Illness: Suicidal Ideation: Negative Plan Formed: Negative Patient has means to carry out plan: Negative  Homicidal Ideation: Negative Plan Formed: Negative Patient has means to carry out plan: Negative  Review of Systems: Psychiatric: Agitation: Negative Hallucination: Negative Depressed Mood: Negative Insomnia: Negative Hypersomnia: Negative Altered Concentration: Negative Feels Worthless: Negative Grandiose Ideas: Negative Belief In Special Powers: Negative New/Increased Substance Abuse: Negative Compulsions: Negative  Neurologic: Headache: Negative Seizure: Negative Paresthesias: Negative  Past Medical Family, Social History:  Past Medical History  Diagnosis Date  . Cancer     Skin   Family History  Problem Relation Age of Onset  . Diabetes Mellitus II Brother   . Schizophrenia Maternal Uncle   . Alcohol abuse Neg Hx   . Anxiety disorder Neg Hx   . Bipolar disorder Neg Hx   . Dementia Neg Hx   . Depression Neg Hx   . Drug abuse Neg Hx   . Coronary artery disease Neg Hx    History   Social History  . Marital Status: Single    Spouse Name: N/A    Number of Children: N/A  . Years of Education: N/A   Social History Main Topics  . Smoking status: Current Every Day Smoker -- 1.00 packs/day    Types: Cigarettes    Last Attempt to Quit: 12/10/2008  . Smokeless tobacco: Never Used  . Alcohol Use: 3.0 oz/week    6 drink(s) per week  . Drug Use: No  . Sexual Activity: No   Other  Topics Concern  . None   Social History Narrative  . None    Outpatient Encounter Prescriptions as of 08/18/2013  Medication Sig  . asenapine (SAPHRIS) 5 MG SUBL 24 hr tablet Place 1 tablet (5 mg total) under the tongue 2 (two) times daily. Samples provided    Past Psychiatric  History/Hospitalization(s): Anxiety: Negative Bipolar Disorder: Negative Depression: Negative Mania: Negative Psychosis: Negative Schizophrenia: Negative Personality Disorder: Negative Hospitalization for psychiatric illness: No History of Electroconvulsive Shock Therapy: Negative Prior Suicide Attempts: Negative  .vital Physical Exam: Constitutional: General Appearance: alert, oriented, no acute distress and well nourished  Musculoskeletal: Strength & Muscle Tone: within normal limits Gait & Station: normal Patient leans: N/A  Psychiatric Specialty Exam: General Appearance: Casual and Well Groomed  Patent attorney::  Fair  Speech:  Clear and Coherent and Normal Rate  Volume:  Normal  Mood:  "Anxious" Depression: 6-7/10 (0=Very depressed; 5=Neutral; 10=Very Happy)  Anxiety- 6/10 (0=no anxiety; 5= moderate/tolerable anxiety; 10= panic attacks)    Affect:  Appropriate, Congruent and Full Range  Thought Process:  Coherent, Linear and Logical  Orientation:  Full (Time, Place, and Person)  Thought Content:  WDL  Suicidal Thoughts:  No  Homicidal Thoughts:  No  Memory:  Immediate;   Good Recent;   Good Remote;   Good  Judgement:  Fair  Insight:  Fair  Psychomotor Activity:  Normal  Concentration:  Good  Recall:  Negative  Akathisia:  Negative  Handed:  Right  AIMS (if indicated):   As noted in chart.  Assets:  Communication Skills Desire for Improvement Financial Resources/Insurance Intimacy Leisure Time Physical Health Resilience Social Support Talents/Skills  Sleep:  Number of Hours:    Medical Decision Making (Choose Three): Established Problem, Stable/Improving (1), Review of Psycho-Social Stressors (1), Order AIMS Test (2) and Review of Medication Regimen & Side Effects (2)  Assessment: Axis I: Schizoaffective Disorder; Rule out Delusional Disorder vs major Depressive Disorder, severe with psychotic features.   Plan:  Plan of Care:  PLAN:  1. Affirm  with the patient that the medications are taken as ordered. Patient  expressed understanding of how their medications were to be used.    Laboratory:  No labs warranted at this time.    Psychotherapy: Therapy: brief supportive therapy provided.  Discussed psychosocial stressors in detail.    Medications:  Continue the following psychiatric medications as written prior to this appointment with the following changes::  a) asenapine (SAPHRIS) 5 MG SUBL 24 hr tablet-Place 1 tablet (5 mg total) under the tongue 2 (two) times daily. Will consider tapering of this medication. -Risks and benefits, side effects and alternatives discussed with patient, he was given an opportunity to ask questions about his medication, illness, and treatment. All current psychiatric medications have been reviewed and discussed with the patient and adjusted as clinically appropriate. The patient has been provided an accurate and updated list of the medications being now prescribed.   Routine PRN Medications:  Negative  Consultations: The patient was encouraged to keep all PCP and specialty clinic appointments.   Safety Concerns:   Patient told to call clinic if any problems occur. Patient advised to go to  ER  if he should develop SI/HI, side effects, or if symptoms worsen. Has crisis numbers to call if needed.    Other:   8. Patient was instructed to return to clinic in 1 months.  9. The patient was advised to call and cancel their mental health appointment within 24 hours of  the appointment, if they are unable to keep the appointment, as well as the three no show and termination from clinic policy. 10. The patient expressed understanding of the plan and agrees with the above.   Jacqulyn Cane, M.D.  08/18/2013 10:12 AM

## 2013-08-20 ENCOUNTER — Telehealth (HOSPITAL_COMMUNITY): Payer: Self-pay | Admitting: Psychiatry

## 2013-08-20 NOTE — Telephone Encounter (Signed)
[facility] pharmacy has provided Sapharis 5 mg tablet- 80 tablets samples. Will inform patient of the same.

## 2013-10-01 ENCOUNTER — Encounter (INDEPENDENT_AMBULATORY_CARE_PROVIDER_SITE_OTHER): Payer: Self-pay

## 2013-10-01 ENCOUNTER — Ambulatory Visit (INDEPENDENT_AMBULATORY_CARE_PROVIDER_SITE_OTHER): Payer: Self-pay | Admitting: Psychiatry

## 2013-10-01 ENCOUNTER — Encounter (HOSPITAL_COMMUNITY): Payer: Self-pay | Admitting: Psychiatry

## 2013-10-01 VITALS — BP 135/90 | HR 152 | Wt 199.5 lb

## 2013-10-01 DIAGNOSIS — F259 Schizoaffective disorder, unspecified: Secondary | ICD-10-CM

## 2013-10-01 NOTE — Progress Notes (Signed)
Ronald [name]  Ronald [name] [birth year] ABLE MALLOY [id number] 48 y.o.  10/01/2013 10:32 AM  Chief Complaint:  HPI Comments: Mr. Messmer is  a 48 y/o male with a past psychiatric history significant for Schizoaffective Disorder. The patient is referred for psychiatric services formedication management.    . Location: The patient reports he has been doing well with the decrease in Asenapine. He is currently receiving Sapharis samples through [facility]. . Quality:  The patient reports that his  main stressors are:  "unemployment"-he is hoping to find a job.  The patient reports that he first came to Dr. Laurance Flatten around 2011, secondary to paranoia and delusions. He endorses unemployment and financial strains along with poor diet lead to depression with poor sleep, anhedonia, and paranoid ideation.  He reports no previous episodes of psychosis prior to 2011, and believes some of the symptoms were related to his situation (being homeless, inadequate diet, and lack of sleep.)  In the area of affective symptoms, patient appears anxious. Patient denies current suicidal ideation, intent, or plan. Patient denies current homicidal ideation, intent, or plan. Patient denies auditory hallucinations. Patient denies visual hallucinations. Patient denies symptoms of paranoia, but endorses paranoia in the past about people "get him" he is not really sure why. Patient states sleep is good, with approximately 7-8 hours of sleep per night. Appetite is good. Energy level is fair. Patient denies symptoms of anhedonia. Patient denies hopelessness, helplessness, or guilt.   . Severity: Depression: 6/10 (0=Very depressed; 5=Neutral; 10=Very Happy)  Anxiety- 7-8/10 (0=no anxiety; 5= moderate/tolerable anxiety; 10= panic attacks)  . Duration: Lost job in 2007, mood worsened to 2011 leading to a episode of paranoia in which he called the police and accused them of hypnotizing.    . Timing: No specific time.  . Context: None currently, in the past worrying about employment. Interactions with his sister.  . Modifying factors: Working on his research paper. Looking forward to getting it published  . Associated signs and symptoms: As noted below.  Called his mother for collateral information.  She reports no change or worsening of mood or problems with delusions or hallucinations wotj decrease in dose of Sapharis.  She reports that the patient has been more closed to her.  History of Present Illness: Suicidal Ideation: Negative Plan Formed: Negative Patient has means to carry out plan: Negative  Homicidal Ideation: Negative Plan Formed: Negative Patient has means to carry out plan: Negative  Review of Systems: Psychiatric: Agitation: Yes-Minimal with interactions with his sister. Hallucination: Negative Depressed Mood: Negative Insomnia: Negative Hypersomnia: Negative Altered Concentration: Negative Feels Worthless: Negative Grandiose Ideas: Negative Belief In Special Powers: Negative New/Increased Substance Abuse: Negative Compulsions: Negative  Neurologic: Headache: Negative Seizure: Negative Paresthesias: Negative  Past Medical Family, Social History:  Past Medical History  Diagnosis Date  . Cancer     Skin   Family History  Problem Relation Age of Onset  . Diabetes Mellitus II Brother   . Schizophrenia Maternal Uncle   . Alcohol abuse Neg Hx   . Anxiety disorder Neg Hx   . Bipolar disorder Neg Hx   . Dementia Neg Hx   . Depression Neg Hx   . Drug abuse Neg Hx   . Coronary artery disease Neg Hx    History   Social History  . Marital Status: Single    Spouse Name: N/A    Number of Children: N/A  . Years of Education: N/A   Social  History Main Topics  . Smoking status: Current Every Day Smoker -- 1.00 packs/day    Types: Cigarettes    Last Attempt to Quit: 12/10/2008  . Smokeless tobacco: Never Used  . Alcohol Use: 3.0  oz/week    6 drink(s) per week  . Drug Use: No  . Sexual Activity: No   Other Topics Concern  . Not on file   Social History Narrative  . No narrative on file    Outpatient Encounter Prescriptions as of 10/01/2013  Medication Sig  . asenapine (SAPHRIS) 5 MG SUBL 24 hr tablet Place 1 tablet (5 mg total) under the tongue 2 (two) times daily. Samples provided    Past Psychiatric History/Hospitalization(s): Anxiety: Negative Bipolar Disorder: Negative Depression: Negative Mania: Negative Psychosis: Negative Schizophrenia: Negative Personality Disorder: Negative Hospitalization for psychiatric illness: No History of Electroconvulsive Shock Therapy: Negative Prior Suicide Attempts: Negative   Review of Systems  Constitutional: Negative for fever, chills, weight loss and malaise/fatigue.  HENT: Positive for tinnitus (Left ear and sometime in his right ear.).   Eyes: Negative for blurred vision, double vision and photophobia.  Respiratory: Negative for cough, hemoptysis, sputum production and shortness of breath.   Cardiovascular: Negative for chest pain, palpitations and leg swelling.  Gastrointestinal: Negative for heartburn, nausea, vomiting, abdominal pain, diarrhea and constipation.  Genitourinary: Negative for dysuria and urgency.  Skin: Negative for itching and rash.  Neurological: Negative for dizziness, tingling, focal weakness, seizures, loss of consciousness and headaches.   Filed Vitals:   10/01/13 1046  BP: 135/90  Pulse: 152  Weight: 199 lb 8 oz (90.493 kg)   Physical Exam: Constitutional: General Appearance: alert, oriented, no acute distress and well nourished Musculoskeletal: Strength & Muscle Tone: within normal limits Gait & Station: normal Patient leans: N/A  Psychiatric Specialty Exam: General Appearance: Casual and Well Groomed  Engineer, water::  Fair  Speech:  Clear and Coherent and Normal Rate  Volume:  Normal  Mood:  "okay"  Affect:   Appropriate, Congruent and Full Range  Thought Process:  Coherent, Linear and Logical  Orientation:  Full (Time, Place, and Person)  Thought Content:  WDL  Suicidal Thoughts:  No  Homicidal Thoughts:  No  Memory:  Immediate;   Good Recent;   Good Remote;   Good  Judgement:  Fair  Insight:  Fair  Psychomotor Activity:  Normal  Concentration:  Good  Recall:  Negative  Akathisia:  Negative  Handed:  Right  AIMS (if indicated):   As noted in chart.  Assets:  Communication Skills Desire for Improvement Financial Resources/Insurance Intimacy Leisure Time Physical Health Resilience Social Support Talents/Skills   Medical Decision Making (Choose Three): Established Problem, Stable/Improving (1), Review of Psycho-Social Stressors (1), Order AIMS Test (2) and Review of Medication Regimen & Side Effects (2)  Assessment: Schizoaffective Disorder-stable  Axis I: Schizoaffective Disorder; Rule out Delusional Disorder vs major Depressive Disorder, severe with psychotic features.  Plan:  Plan of Care:  PLAN:  1. Affirm with the patient that the medications are taken as ordered. Patient  expressed understanding of how their medications were to be used.    Laboratory:  No labs warranted at this time. Will need to order FBS, Hba1C, FLP   Psychotherapy: Therapy: brief supportive therapy provided.  Discussed psychosocial stressors in detail. More than 50% of the [name] was spent on individual therapy.   Medications:  Continue the following psychiatric medications as written prior to this appointment with the following changes::  a) Decreased asenapine-Take  5 mg in the morning and 2.5 mg at night.-No further decrease at this time   -Risks and benefits, side effects and alternatives discussed with patient, he was given an opportunity to ask questions about his medication, illness, and treatment. All current psychiatric medications have been reviewed and discussed with the patient and adjusted  as clinically appropriate. The patient has been provided an accurate and updated list of the medications being now prescribed.   Routine PRN Medications:  Negative  Consultations: The patient was encouraged to keep all PCP and specialty clinic appointments.   Safety Concerns:   Patient told to call clinic if any problems occur. Patient advised to go to  ER  if he should develop SI/HI, side effects, or if symptoms worsen. Has crisis numbers to call if needed.    Other:   8. Patient was instructed to return to clinic in  months.  9. The patient was advised to call and cancel their mental health appointment within 24 hours of the appointment, if they are unable to keep the appointment, as well as the three no show and termination from clinic policy. 10. The patient expressed understanding of the plan and agrees with the above.  Time Spent: 30 minutes Coralyn Helling, M.D.  10/01/2013 10:41 AM

## 2013-10-03 MED ORDER — ASENAPINE MALEATE 5 MG SL SUBL: SUBLINGUAL_TABLET | SUBLINGUAL | Status: DC

## 2013-10-20 ENCOUNTER — Telehealth (HOSPITAL_COMMUNITY): Payer: Self-pay

## 2013-10-24 NOTE — Telephone Encounter (Signed)
Will call [facility] for Sapharis

## 2013-10-31 ENCOUNTER — Telehealth (HOSPITAL_COMMUNITY): Payer: Self-pay | Admitting: Psychiatry

## 2013-10-31 ENCOUNTER — Encounter (HOSPITAL_COMMUNITY): Payer: Self-pay | Admitting: Psychiatry

## 2013-10-31 ENCOUNTER — Ambulatory Visit (INDEPENDENT_AMBULATORY_CARE_PROVIDER_SITE_OTHER): Payer: Self-pay | Admitting: Psychiatry

## 2013-10-31 VITALS — BP 113/95 | HR 126 | Wt 192.0 lb

## 2013-10-31 DIAGNOSIS — F259 Schizoaffective disorder, unspecified: Secondary | ICD-10-CM

## 2013-10-31 NOTE — Progress Notes (Signed)
[facility] Follow-up Outpatient Visit  Ronald [name] 13-May-1966 [id number] 48 y.o.  10/31/2013 2:46 PM  Chief Complaint:  HPI Comments: Mr. Frith is  a 48 y/o male with a past psychiatric history significant for Schizoaffective Disorder. The patient is referred for psychiatric services formedication management.    . Location: The patient reports that he has had suicidal thoughts over the past week.  . Quality:  The patient reports that his  main stressors are:  "unemployment"-he is hoping to find a job.  In the area of affective symptoms, patient appears anxious and depressed. Patient denies current suicidal ideation, intent, or plan. Patient denies current homicidal ideation, intent, or plan. Patient denies auditory hallucinations. Patient denies visual hallucinations. Patient denies symptoms of paranoia, but endorses paranoia in the past about people "get him" he is not really sure why. Patient states sleep is poor. Appetite is good. Energy level is fair. Patient denies symptoms of anhedonia. Patient denies hopelessness, helplessness, or guilt.   . Severity: Severe  . Duration:Over the past week.  . Timing: Throughout the day  . Context: Employment  The patient mother reports that   History of Present Illness: Suicidal Ideation: Yes Plan Formed: Yes Patient has means to carry out plan: Yes  Homicidal Ideation: Negative Plan Formed: Negative Patient has means to carry out plan: Negative  Review of Systems: Psychiatric: Agitation: Yes-Minimal with interactions with his sister. Hallucination: Negative Depressed Mood: Yes Insomnia: Negative Hypersomnia: Negative Altered Concentration: Negative Feels Worthless: Negative Grandiose Ideas: Negative Belief In Special Powers: Negative New/Increased Substance Abuse: Negative Compulsions: Negative  Neurologic: Headache: Negative Seizure: Negative Paresthesias: Negative  Past Medical Family, Social  History:  Past Medical History  Diagnosis Date  . Cancer     Skin  . Squamous cell skin cancer, ala nasi    Family History  Problem Relation Age of Onset  . Diabetes Mellitus II Brother   . Schizophrenia Maternal Uncle   . Alcohol abuse Neg Hx   . Anxiety disorder Neg Hx   . Bipolar disorder Neg Hx   . Dementia Neg Hx   . Depression Neg Hx   . Drug abuse Neg Hx   . Coronary artery disease Neg Hx    History   Social History  . Marital Status: Single    Spouse Name: N/A    Number of Children: N/A  . Years of Education: N/A   Social History Main Topics  . Smoking status: Current Every Day Smoker -- 1.00 packs/day    Types: Cigarettes    Last Attempt to Quit: 12/10/2008  . Smokeless tobacco: Never Used  . Alcohol Use: 3.0 oz/week    6 drink(s) per week  . Drug Use: No  . Sexual Activity: No   Other Topics Concern  . Not on file   Social History Narrative  . No narrative on file    Outpatient Encounter Prescriptions as of 10/31/2013  Medication Sig  . asenapine (SAPHRIS) 5 MG SUBL 24 hr tablet Take 5 mg in the morning and 2.5 mg at night.    Past Psychiatric History/Hospitalization(s): Anxiety: Negative Bipolar Disorder: Negative Depression: Negative Mania: Negative Psychosis: Negative Schizophrenia: Negative Personality Disorder: Negative Hospitalization for psychiatric illness: No History of Electroconvulsive Shock Therapy: Negative Prior Suicide Attempts: Negative   Review of Systems  Constitutional: Negative for fever, chills, weight loss and malaise/fatigue.  HENT: Positive for tinnitus (Left ear and sometime in his right ear.).   Eyes: Negative for blurred vision, double  vision and photophobia.  Respiratory: Negative for cough, hemoptysis, sputum production and shortness of breath.   Cardiovascular: Negative for chest pain, palpitations and leg swelling.  Gastrointestinal: Negative for heartburn, nausea, vomiting, abdominal pain, diarrhea and  constipation.  Genitourinary: Negative for dysuria and urgency.  Skin: Negative for itching and rash.  Neurological: Negative for dizziness, tingling, focal weakness, seizures, loss of consciousness and headaches.   Filed Vitals:   10/31/13 1503  BP: 113/95  Pulse: 126  Weight: 192 lb (87.091 kg)    Physical Exam: Constitutional: General Appearance: alert, oriented, no acute distress and well nourished Musculoskeletal: Gait & Station: normal Patient leans: N/A  Psychiatric Specialty Exam: General Appearance: Casual and Well Groomed  Engineer, water::  Fair  Speech:  Clear and Coherent and Normal Rate  Volume:  Normal  Mood:  "okay"  Affect:  Appropriate, Congruent and Full Range  Thought Process:  Coherent, Linear and Logical  Orientation:  Full (Time, Place, and Person)  Thought Content:  WDL  Suicidal Thoughts:  Yes.  with intent/plan  Homicidal Thoughts:  No  Memory:  Immediate;   Good Recent;   Good Remote;   Good  Judgement:  Fair  Insight:  Fair  Psychomotor Activity:  Normal  Concentration:  Good  Recall:  Negative  Akathisia:  Negative  Handed:  Right  AIMS (if indicated):   As noted in chart.  Assets:  Communication Skills Desire for Improvement Financial Resources/Insurance Intimacy Leisure Time Physical Health Resilience Social Support Talents/Skills   Medical Decision Making (Choose Three): Review of Psycho-Social Stressors (1), Order AIMS Test (2), Established Problem, Worsening (2) and Review of Medication Regimen & Side Effects (2)  Assessment: Schizoaffective Disorder-worsening  Axis I: Schizoaffective Disorder; Rule out Delusional Disorder vs major Depressive Disorder, severe with psychotic features.  Plan: Will send patient to ER with his mother. Called patient's mother confirmed that patient has been admitted to [facility]. More than 50% of the visit was spent on individual therapy/counseling.  Time Spent: 30 minutes Coralyn Helling, M.D.   10/31/2013 2:46 PM

## 2013-10-31 NOTE — Telephone Encounter (Signed)
Saw patient today at the clinic. Advised them to go to the ED.

## 2013-11-01 DIAGNOSIS — F329 Major depressive disorder, single episode, unspecified: Secondary | ICD-10-CM | POA: Insufficient documentation

## 2013-11-05 NOTE — Telephone Encounter (Signed)
Called patient's mother back. Left message.

## 2013-11-05 NOTE — Telephone Encounter (Signed)
Called patient's mother. Spoke to her.

## 2013-11-06 ENCOUNTER — Ambulatory Visit (HOSPITAL_COMMUNITY): Payer: Self-pay | Admitting: Psychiatry

## 2013-11-07 ENCOUNTER — Telehealth (HOSPITAL_COMMUNITY): Payer: Self-pay

## 2013-11-07 NOTE — Telephone Encounter (Signed)
Called patient's. She reports she was told by the inpatient treatment team that  support staff (Ms. Olivia Canter) stated Saphris samples would be available indefinitely for the patient. She reports that based on the statement that the patient could receive medications indefinitely, the patient was released from the hospital. Will confirm this statement and make saphris samples available.

## 2013-11-10 NOTE — Telephone Encounter (Signed)
Called pharmacy. Will get samples of medicaiton of Sapharis at this point but will try another.

## 2013-11-11 ENCOUNTER — Ambulatory Visit (INDEPENDENT_AMBULATORY_CARE_PROVIDER_SITE_OTHER): Payer: Self-pay | Admitting: Psychiatry

## 2013-11-11 ENCOUNTER — Encounter (INDEPENDENT_AMBULATORY_CARE_PROVIDER_SITE_OTHER): Payer: Self-pay

## 2013-11-11 DIAGNOSIS — F259 Schizoaffective disorder, unspecified: Secondary | ICD-10-CM

## 2013-11-12 ENCOUNTER — Encounter (HOSPITAL_COMMUNITY): Payer: Self-pay | Admitting: Psychiatry

## 2013-11-12 ENCOUNTER — Ambulatory Visit (INDEPENDENT_AMBULATORY_CARE_PROVIDER_SITE_OTHER): Payer: Self-pay | Admitting: Psychiatry

## 2013-11-12 VITALS — BP 128/85 | HR 100 | Wt 192.0 lb

## 2013-11-12 DIAGNOSIS — F259 Schizoaffective disorder, unspecified: Secondary | ICD-10-CM

## 2013-11-12 MED ORDER — FLUOXETINE HCL 20 MG PO CAPS: 60.0000 mg | ORAL_CAPSULE | Freq: Every day | ORAL | Status: DC

## 2013-11-12 MED ORDER — ASENAPINE MALEATE 10 MG SL SUBL: 10.0000 mg | SUBLINGUAL_TABLET | Freq: Two times a day (BID) | SUBLINGUAL | Status: DC

## 2013-11-12 NOTE — Progress Notes (Signed)
[place] Follow-up Outpatient Visit  Ronald [name] 1966-04-20 [id number] 48 y.o.  11/12/2013 4:39 PM  Chief Complaint:  HPI Comments: Ronald [name] is  a 48 y/o male with a past psychiatric history significant for Schizoaffective Disorder. The patient is referred for psychiatric services formedication management.    . Location: The patient he continues to have anxiety.  He has less paranoia with Saphris. . Quality:  The patient reports that his  main stressors are:  "unemployment"-he is hoping to find a job. 'lack of insurance"-he is currently applying.  In the area of affective symptoms, patient appears anxious and depressed. Patient denies current suicidal ideation, intent, or plan. Patient denies current homicidal ideation, intent, or plan. Patient denies auditory hallucinations. Patient denies visual hallucinations. Patient denies symptoms of paranoia, but endorses paranoia in the past about people "get him" he is not really sure why. Patient states sleep is poor. Appetite is good. Energy level is fair. Patient denies symptoms of anhedonia. Patient denies hopelessness, helplessness, or guilt.   . Severity: Severe  . Duration: Over the past week.  . Timing: Throughout the day  . Context: Employment  The patient mother reports that   History of Present Illness: Suicidal Ideation: Yes Plan Formed: Yes Patient has means to carry out plan: Yes  Homicidal Ideation: Negative Plan Formed: Negative Patient has means to carry out plan: Negative  Review of Systems: Psychiatric: Agitation: Yes-Minimal with interactions with his sister. Hallucination: Negative Depressed Mood: Yes Insomnia: Negative Hypersomnia: Negative Altered Concentration: Negative Feels Worthless: Negative Grandiose Ideas: Negative Belief In Special Powers: Negative New/Increased Substance Abuse: Negative Compulsions: Negative  Neurologic: Headache: Negative Seizure:  Negative Paresthesias: Negative  Past Medical Family, Social History:  Past Medical History  Diagnosis Date  . Cancer     Skin  . Squamous cell skin cancer, ala nasi    Family History  Problem Relation Age of Onset  . Diabetes Mellitus II Brother   . Schizophrenia Maternal Uncle   . Alcohol abuse Neg Hx   . Anxiety disorder Neg Hx   . Bipolar disorder Neg Hx   . Dementia Neg Hx   . Depression Neg Hx   . Drug abuse Neg Hx   . Coronary artery disease Neg Hx    History   Social History  . Marital Status: Single    Spouse Name: N/A    Number of Children: N/A  . Years of Education: N/A   Social History Main Topics  . Smoking status: Current Every Day Smoker -- 1.00 packs/day    Types: Cigarettes    Last Attempt to Quit: 12/10/2008  . Smokeless tobacco: Never Used  . Alcohol Use: 3.0 oz/week    6 drink(s) per week  . Drug Use: No  . Sexual Activity: No   Other Topics Concern  . Not on file   Social History Narrative  . No narrative on file    Outpatient Encounter Prescriptions as of 11/12/2013  Medication Sig  . Asenapine Maleate (SAPHRIS) 10 MG SUBL Place 10 mg under the tongue.  Marland Kitchen FLUoxetine (PROZAC) 20 MG capsule Take 60 mg by mouth.  . traZODone (DESYREL) 100 MG tablet Take 100 mg by mouth at bedtime.  . [DISCONTINUED] asenapine (SAPHRIS) 5 MG SUBL 24 hr tablet Take 5 mg in the morning and 2.5 mg at night.    Past Psychiatric History/Hospitalization(s): Anxiety: Negative Bipolar Disorder: Negative Depression: Negative Mania: Negative Psychosis: Negative Schizophrenia: Negative Personality Disorder: Negative Hospitalization for psychiatric  illness: No History of Electroconvulsive Shock Therapy: Negative Prior Suicide Attempts: Negative   Review of Systems  Constitutional: Negative for fever, chills, weight loss and malaise/fatigue.  HENT: Positive for tinnitus (Left ear and sometime in his right ear.).   Eyes: Negative for blurred vision, double  vision and photophobia.  Respiratory: Negative for cough, hemoptysis, sputum production and shortness of breath.   Cardiovascular: Negative for chest pain, palpitations and leg swelling.  Gastrointestinal: Negative for heartburn, nausea, vomiting, abdominal pain, diarrhea and constipation.  Genitourinary: Negative for dysuria and urgency.  Skin: Negative for itching and rash.  Neurological: Negative for dizziness, tingling, focal weakness, seizures, loss of consciousness and headaches.   Filed Vitals:   11/12/13 1640  BP: 128/85  Pulse: 100  Weight: 192 lb (87.091 kg)    Physical Exam: Constitutional: General Appearance: alert, oriented, no acute distress and well nourished Musculoskeletal: Gait & Station: normal Patient leans: N/A  Psychiatric Specialty Exam: General Appearance: Casual and Well Groomed  Engineer, water::  Fair  Speech:  Clear and Coherent and Normal Rate  Volume:  Normal  Mood:  "anxious about medications."-referring to whether they will be available and affordable  Affect:  Appropriate, Congruent and Full Range  Thought Process:  Coherent, Linear and Logical  Orientation:  Full (Time, Place, and Person)  Thought Content:  WDL  Suicidal Thoughts:  No  Homicidal Thoughts:  No  Memory:  Immediate;   Good Recent;   Good Remote;   Good  Judgement:  Fair  Insight:  Fair  Psychomotor Activity:  Normal  Concentration:  Good  Recall:  Negative  Akathisia:  Negative  Handed:  Right  AIMS (if indicated):   As noted in chart.  Assets:  Communication Skills Desire for Improvement Financial Resources/Insurance Intimacy Leisure Time Physical Health Resilience Social Support Talents/Skills   Medical Decision Making (Choose Three): Review of Psycho-Social Stressors (1), Order AIMS Test (2), Established Problem, Worsening (2) and Review of Medication Regimen & Side Effects (2)  Assessment: Schizoaffective Disorder-improving  Axis I: Schizoaffective Disorder;  Rule out Delusional Disorder vs major Depressive Disorder, severe with psychotic features.  Plan:  Plan of Care:  PLAN:  1. Affirm with the patient that the medications are taken as ordered. Patient  expressed understanding of how their medications were to be used.    Laboratory:  No labs warranted at this time. Will order random drug screens as long as patient is on a scheduled medication.  Psychotherapy: Therapy: brief supportive therapy provided.  Discussed psychosocial stressors in detail.  More than 50% of the visit was spent on individual therapy/counseling.   Medications:  Continue the following psychiatric medications as written prior to this appointment with the following changes::  a) Saphris 10 mg BID-Patient was informed Saphris may not always be available as samples and that e must find a way to get insurance to cover this. b) Prozac 60 mg daily C)  Increase trazodone 150 mg -Risks and benefits, side effects and alternatives discussed with patient, he was given an opportunity to ask questions about his medication, illness, and treatment. All current psychiatric medications have been reviewed and discussed with the patient and adjusted as clinically appropriate. The patient has been provided an accurate and updated list of the medications being now prescribed.   Routine PRN Medications:  Negative  Consultations: The patient was encouraged to keep all PCP and specialty clinic appointments.   Safety Concerns:   Patient told to call clinic if any problems occur. Patient  advised to go to  ER  if he should develop SI/HI, side effects, or if symptoms worsen. Has crisis numbers to call if needed.    Other:   8. Patient was instructed to return to clinic in 1 months.  9. The patient was advised to call and cancel their mental health appointment within 24 hours of the appointment, if they are unable to keep the appointment, as well as the three no show and termination from clinic  policy. 10. The patient expressed understanding of the plan and agrees with the above. 11 Advised patient I would no longer be at this practice after December 24, 2013.  Time Spent: 25 minutes  Coralyn Helling, M.D.  11/12/2013 4:39 PM

## 2013-11-12 NOTE — Progress Notes (Signed)
[facility] Assessment  Ronald [name] 48 y.o. 11/12/2013   Referred by: Dr. Josephine Igo and [facility] following most recent inpatient psychiatric hospitalization  Chief Complaint: Mr. Ronald [name] is a 48 y/o, single Caucasian, unemployed, male referred by [facility] near [place], [place] following a recent inpatient psychiatric hospitalizationwith for the treatment Schizoaffective Disorder with paranoid delusions. Pts most recent Assessment remains current and accurate and writer is providing a brief addendum.  Pt presented with his mother Ronald [name]) for the session. Pt appeared cautious and anxious. Pt provided some information but was a more accurate historian with his mother present. Pt said he was just released this past Friday from the inpatient hospitalization for depression with paranoia and a suicide attempt. Pt said the reason he is seeking treatment is the hospital wanted him to have a counselor prior to being discharged. Pt said he was in counseling before with this practice with Tammi Sou, but did not find it beneficial because he did not feel comfortable sharing. Pt denies any other suicide attempts or hospitalizations. Pt is current seeing Dr. Josephine Igo for psychiatry services within this practice. Pt said his primary stressors include: 1) Paranoia. Pt feels that when he leaves his house people will kidnap him and try to interrogate him for information. He also worries that [employer] is going to prosecute him for not obtaining employment and for making the decision to stop using their employment services. 2) Financial Stress. Pt owes $19,000 in past due credit card debt and currently does not have any income to pay the minimal payment. He wants assistance with debt consolidation or assistance. 3) Depression. Pt feels depressed because he is not able to leave his house due to the paranoia. He would like to be stable enough to obtain  employment. Pt holds an MBA and used to be employed back in 2002 for a [employer] until he was fired for having a disagreement with his Librarian, academic. 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 Pt currently lives with his mother and she is the primary caretaker and financial provider for Pt. Pt is currently unable to function independently due to his mental health condition. Pts only supports are his mother and his sister, who resides in [place], who provides help with paying Pts bills. Pt said they have a strained relationship. Pt is hoping to learn ways to manage his symptoms and be more independent.   HPI Comments:   . Location: The patient reports he has been doing well with his current medications. He is currently receiving Sapharis samples through [facility]  . Quality:  The patient reports that his main stressors are:  "unemployment"-he is hoping to find a job.  The patient reports that he first came to Dr. Laurance Flatten around 2011, secondary to paranoia and delusions. He endorses unemployment and financial strains along with poor diet lead to depression with poor sleep, anhedonia, and paranoid ideation. He reports no previous episodes of psychosis prior to 2011, and believes some of the symptoms were related to his situation (being homeless, inadequate diet, and lack of sleep.)  In the area of affective symptoms, patient appears anxious. Patient denies current suicidal ideation, intent, or plan. Patient denies current homicidal ideation, intent, or plan. Patient denies auditory hallucinations. Patient denies visual hallucinations. Patient denies symptoms of paranoia, but endorses paranoia in the past about people "get him" he is not  really sure why. Patient states sleep is good, with approximately 8 hours of sleep per night. Appetite is good. Energy level is fair. Patient denies symptoms of anhedonia.  Patient denies hopelessness, helplessness, or guilt.  . Severity:  Depression: 6-7/10 (0=Very depressed; 5=Neutral; 10=Very Happy)  Anxiety- 6/10 (0=no anxiety; 5= moderate/tolerable anxiety; 10= panic attacks)  . Duration: Lost job in 2007, mood worsened to 2011 leading to a episode of paranoia in which he called the police and accused them of hypnotizing.  . Timing: No specific time.  . Context: None currently, in the past worrying about employment.  . Modifying factors: Working on his research paper.  . Associated signs and symptoms:  As noted below.  History of Present Illness:  Suicidal Ideation: Negative Plan Formed: Negative Patient has means to carry out plan: Negative  Homicidal Ideation: Negative Plan Formed: Negative Patient has means to carry out plan: Negative  Review of Systems:  Psychiatric:  Agitation: Negative  Hallucination: Negative  Depressed Mood: Negative  Insomnia: Negative  Hypersomnia: Negative  Altered Concentration: Negative  Feels Worthless: Negative  Grandiose Ideas: Negative  Belief In Special Powers: Negative  New/Increased Substance Abuse: Negative  Compulsions: Negative  Neurologic:  Headache: Negative  Seizure: Negative  Paresthesias: Negative  Past Medical Family, Social History:  Past Medical History   Diagnosis  Date   .  Cancer      Skin    Family History   Problem  Relation  Age of Onset   .  Diabetes Mellitus II  Brother    .  Schizophrenia  Maternal Uncle    .  Alcohol abuse  Neg Hx    .  Anxiety disorder  Neg Hx    .  Bipolar disorder  Neg Hx    .  Dementia  Neg Hx    .  Depression  Neg Hx    .  Drug abuse  Neg Hx    .  Coronary artery disease  Neg Hx     History    Social History   .  Marital Status:  Single     Spouse Name:  N/A     Number of Children:  N/A   .  Years of Education:  N/A    Social History Main Topics   .  Smoking status:  Current Every Day Smoker -- 1.00 packs/day     Types:  Cigarettes     Last  Attempt to Quit:  12/10/2008   .  Smokeless tobacco:  Never Used   .  Alcohol Use:  3.0 oz/week     6 drink(s) per week   .  Drug Use:  No   .  Sexual Activity:  No    Other Topics  Concern   .  None    Social History Narrative   .  None    Outpatient Encounter Prescriptions as of 08/18/2013   Medication  Sig   .  asenapine (SAPHRIS) 5 MG SUBL 24 hr tablet  Place 1 tablet (5 mg total) under the tongue 2 (two) times daily. Samples provided   Past Psychiatric History/Hospitalization(s):  Anxiety: Negative  Bipolar Disorder: Negative  Depression: Negative  Mania: Negative  Psychosis: Negative  Schizophrenia: Negative  Personality Disorder: Negative  Hospitalization for psychiatric illness: No  History of Electroconvulsive Shock Therapy: Negative  Prior Suicide Attempts: Negative  .vital  Physical Exam:  Constitutional:  General Appearance: alert, oriented, no acute distress and well nourished  Musculoskeletal:  Strength &  Muscle Tone: within normal limits  Gait & Station: normal  Patient leans: N/A  Psychiatric Specialty Exam:  General Appearance: Casual and Well Groomed   Engineer, water:: Fair   Speech: Clear and Coherent and Normal Rate   Volume: Normal   Mood: "Anxious"  Depression: 6-7/10 (0=Very depressed; 5=Neutral; 10=Very Happy)  Anxiety- 6/10 (0=no anxiety; 5= moderate/tolerable anxiety; 10= panic attacks)   Affect: Appropriate, Congruent and Full Range   Thought Process: Coherent, Linear and Logical   Orientation: Full (Time, Place, and Person)   Thought Content: WDL   Suicidal Thoughts: No   Homicidal Thoughts: No   Memory: Immediate; Good  Recent; Good  Remote; Good   Judgement: Fair   Insight: Fair   Psychomotor Activity: Normal   Concentration: Good   Recall: Negative   Akathisia: Negative   Handed: Right   AIMS (if indicated): As noted in chart.   Assets: Communication Skills  Desire for Improvement  Financial Resources/Insurance  Intimacy   Leisure Time  Physical Health  Resilience  Social Support  Talents/Skills   Sleep: Number of Hours:    DIAGNOSIS  AXIS I  Schizoaffective Disorder   PLAN Oriented Pt to the therapeutic process and discussed therapeutic expectations. Pt agrees to weekly counseling to address symptoms of paranoia and depression and learn ways to be more independent. Begin teaching pt WRAP skills to manage symptoms and refer Pt to the [facility] for additional mental health support. Pts mother wants information on the local [place] chapter.    Tresa Res, LCSW 11/12/2013

## 2013-11-14 ENCOUNTER — Telehealth (HOSPITAL_COMMUNITY): Payer: Self-pay

## 2013-11-14 NOTE — Telephone Encounter (Signed)
Called patient's mother. Addressed her concerns.

## 2013-11-17 ENCOUNTER — Ambulatory Visit (INDEPENDENT_AMBULATORY_CARE_PROVIDER_SITE_OTHER): Payer: Self-pay | Admitting: Psychiatry

## 2013-11-17 DIAGNOSIS — F259 Schizoaffective disorder, unspecified: Secondary | ICD-10-CM

## 2013-11-17 NOTE — Progress Notes (Signed)
THERAPIST PROGRESS NOTE  Session Time: 3:00-3:50 pm  Participation Level: Active  Behavioral Response: Neat and Well GroomedAlertAnxious and Paranoid  Type of Therapy: Individual Therapy  Treatment Goals addressed: Created treatment plan goals and Biofeedback  Interventions: Biofeedback  Summary: Ronald [name] is a 48 y.o. male who presents with paranoid and anxious mood and affect. Pt reports feeling intense fear over the possibility of going to jail due to letting his credit card debt default due to not being able to pay any of his monthly payment. Pt processed fears with this and said his anxiety was a 10 at the start of session but went down to a 6/7 after learning he won't go to jail over credit card debt. Pt also learned the baisc neutral breathing technique from biofeedback and said at the end his anxiety was a 5/10. Pt created treatment plan goals see below.     INDIVIDUAL TREATMENT PLAN       Date of Admission:  11-11-13 Date of Treatment Plan: 11-17-13     Service: [x]  Group  [x]  Individual [x]  Comprehensive []  Revised due to: []  Change in Diagnosis     []  Change in Service     []  Expiration of Previous Treatment Plan  Diagnosis:  Schizoaffective Disorder   Recommended Treatment:  [x]  Individual Therapy  []  Family Therapy  [x]  Couples Therapy  [x]  Chemical Dependency (CD-IOP) group therapy 3x weekly and 1:1 individual therapy as required  []  Mental Health (MH-IOP) group therapy 5x weekly and 1:1 individual therapy as required   Possible Negative Outcomes of Treatment: Symptoms of mental illness may increase and changes in relationship can occur during the course of treatment.  Client's Strengths (What are the strengths and resources that will help clients move towards their goals):  Supportive mother, college educated, committed to wellness, open to learning   Personal Recovery Goal(s)/Client's Goals for Treatment (use client's words):  To be able to manage  feelings of depression, paranoia and anxiety more effectively.     Objective Behavioral Criteria for Discharge: Client will maintain stability in the community as demonstrated by the following:   No suicidal behaviors for 3 months.   No hospitalizations or emergency room visits for psychological reasons for 3 months.   No SIB behaviors requiring medical attention for 3 months.   Emotional regulation by reporting distress at an average of 5/10 or below for 3 months.   Demonstration of healthy community supports by initiating peer contact at least twice weekly separate from the treatment environment.   Agreed-upon transition plan to a less restrictive setting.  INFORMED CONSENT TO TREATMENT. I acknowledge that I have been informed of and am able to understand my diagnosis, the nature and purpose of the proposed treatment, the risks and benefits of the proposed treatment, the possible negative outcomes of and possible alternatives to the proposed treatment, the probability that the proposed treatment will be successful, and the prognosis if I choose not to receive the treatment. Further, I have been informed of the extent and limits of confidentiality of treatment information. I understand the risks, benefits, possible negative outcomes of and alternatives to this proposed treatment, and my signature below verifies that I have actively participated in the development of my treatment plan and that I am willingly and voluntarily agreeing to the treatment outlined in this plan. Assessed Needs (Problem) Client's Goals (what client will do)   Interventions (what staff will do)   1. Depression    o Have improved  mood and return to a healthier level of functioning.   o Identify the causes for depressed mood and learn ways to cope with depression.  o Use a crisis plan if suicidal thoughts occur.  o Explore how depression is experienced in day-to-day living; encourage sharing of feelings of depression to  gain insight into causes and create a coping plan to manage depression symptoms. o Provide education about depression to help patient identify causes, symptoms and triggers. o Teach and encourage the use of coping skills for management of depression symptoms.  o Teach WRAP skills for depression management.  o Encourage Pt attend the support groups through [place] to create a positive support network. o Make a crisis plan and assess ongoing level of safety.    2. Anxiety     o Improve ability to manage anxiety symptoms and better handle stress.  o Identify causes for anxiety and explore ways to lower it  o Manage thoughts and worrisome thinking that contribute to feelings of anxiety. o Provide education about anxiety to help patient identify causes, symptoms and triggers. o Facilitate problem solution skills to help patient identify and implement options to resolve stressors.  o Teach coping skills to manage anxiety symptoms such as; biofeedback, guided imagery, meditation and aromatherapy. o Use CBT to identify and change anxiety provoking thought patterns. o Teach Biofeedback skills to increase Pts ability to manage anxiety symptoms.    3. Paranoia    o Manage fears associated with paranoid thinking and feel less worried.  o Use CBT to identify and change anxiety provoking thought patterns and delusional thinking and challenge distorted thoughts. o Provide education to challenge delusional thoughts.  o Teach calming strategies to manage anxiety associated with paranoia.    I have [] received [] declined a copy of this treatment plan.  Client: Date: Guardian/Parent: Date:   Therapist: Date: Licensed Provider (if applicable): Date:    Six month review:   I have reviewed this treatment plan and consider it still valid. Client: Date: Guardian/Parent: Date:      Suicidal/Homicidal: No  Therapist Response: Assessed overall level of functioning per Pt self report, gave Pt a list of  support groups through [facility] his mother requested to take home to her, provided education to reduce paranoia and introduced Pt to a breathing technique to reduce anxiety, assigned homework of five minutes of breathing daily until next session.   Plan: Return again in 1 week. Begin working on treatment plan goals. Use Biofeedback for stress management and follow up with homework.   Diagnosis: Axis I: Schizoaffective Disorder       Tresa Res, LCSW 11/17/2013

## 2013-11-24 ENCOUNTER — Ambulatory Visit (INDEPENDENT_AMBULATORY_CARE_PROVIDER_SITE_OTHER): Payer: Self-pay | Admitting: Psychiatry

## 2013-11-24 DIAGNOSIS — F259 Schizoaffective disorder, unspecified: Secondary | ICD-10-CM

## 2013-11-24 NOTE — Progress Notes (Signed)
   THERAPIST PROGRESS NOTE  Session Time: 1:00-1:50 pm  Participation Level: Active  Behavioral Response: Neat and Well GroomedAlertDepressed and Delusional thinking  Type of Therapy: Individual Therapy  Treatment Goals addressed: Depression and paranoia  Interventions: CBT and Supportive  Summary: Ronald [name] is a 48 y.o. male who presents with mild depressed mood and some delusional thought processes. Pt said his mood has remained stable and he denied any active depression symptoms. Pt said his main stressor continues to be worrying about the $17,000 he owes in past credit card debt that he is not able to pay on. Pt said he decided not to contact the credit counselor. Pt said he has been using the biofeedback neutral technique he was taught in the last session and feels it is helping him reduce his anxiety. Pt denied wanting to pursue this further in this session. 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 Pt talked through the process of applying and sounded like it would be simply to apply despite not working any job for the past eight years. Pt minimized the components of the job search. 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 Pt is aware he has not experience in financial counseling and no degree to allow him to function in this capacity. Pt said the thought of this makes him feel he has a purpose. 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 Pt said he did this all for free and was wondering if he could pursue collecting past due money for this work from back in the 1980's and 1990's. Pt said he would consider looking into the [employer] for continued support with his mental  illness and let writer know this week. Pt also requested writer be able to communicate with is mother, per her request. Writer gave Pt a Release of Information and instructed Pt to complete it prior to writer speaking with her.   Suicidal/Homicidal: No  Therapist Response: Assessed overall level of functioning and delusional thinking per Pt report and behaviors and thoughts during session, reviewed coping skills to manage depression and triggers from the past week, problem solved with Pt the process for applying for jobs,  reviewed use of the biofeedback breathing technique for stress management, referred Pt to [facility] for additional mental health support.   Plan: Return again in 1 week. Monitor delusional thought process and depression level.   Diagnosis: Axis I: Schizoaffective Disorder      Tresa Res, LCSW 11/24/2013

## 2013-11-25 ENCOUNTER — Telehealth (HOSPITAL_COMMUNITY): Payer: Self-pay

## 2013-11-25 NOTE — Telephone Encounter (Signed)
Error

## 2013-11-28 NOTE — Telephone Encounter (Signed)
Called patient

## 2013-12-01 ENCOUNTER — Telehealth (HOSPITAL_COMMUNITY): Payer: Self-pay

## 2013-12-01 ENCOUNTER — Ambulatory Visit (INDEPENDENT_AMBULATORY_CARE_PROVIDER_SITE_OTHER): Payer: Self-pay | Admitting: Psychiatry

## 2013-12-01 DIAGNOSIS — F259 Schizoaffective disorder, unspecified: Secondary | ICD-10-CM

## 2013-12-01 NOTE — Progress Notes (Signed)
   THERAPIST PROGRESS NOTE  Session Time: 1:00-1:50 pm  Participation Level: Active  Behavioral Response: Neat and Well GroomedAlertAnxious  Type of Therapy: Individual Therapy  Treatment Goals addressed: Anxiety and Paranoia  Interventions: CBT, DBT and Supportive  Summary: Ronald [name] is a 48 y.o. male who presents with moderate anxious mood and affect, but appears to be at baseline level of functioning per Pt and his mothers self report. Pt said he continues to have high anxiety associated with his credit card debt and said he applied for the janitorial job but was discouraged because it has been two days and he has not heard anything. Pt said he is thinking about applying to another janitorial job in [place]. Pt said he followed up with a brokrage firms in the area to propose his idea of managing "rich peoples investments" and they were not interested. Pt said his mother meets with him every morning to ask him how he is sleeping, how he feels and what his goals for the day are. Pt said this is working well for him. She also administers his mental health medication twice daily to ensure he is taking it as prescribed. Pt said he would like to be more independent with this skill, but required assistance identifying a plan for how he would take them on his own. Pt appears to have ideas of things he would like to accomplish, but lacks the ability to problem solve and organize the follow through of steps that would go into implementation. Pt said this is also evident in him applying for Medicaid and other financial assistance programs. Pt said this task has been on he daily goal list for several days, but he gets overwhelmed with the process and does not always know the steps to take to make progress on completing it. Pt said overall this past week his anxiety has been moderate and he has joined a gym and has attended daily since getting the membership.  Pt agreed to discuss process steps at  the next session if he continues to feel stuck in these areas.   Suicidal/Homicidal: No  Therapist Response: Assessed overall level of functioning per Pt self report. Reviewed daily routine and schedule for mental health wellness and the support he is getting from his mother and sister, reviewed anxiety coping skills using the DBT distress tolerance model Pt was introduced to in the hospital, used solution focused therapy to identify steps necessary for Pt to make progress with his goals.   Plan: Return again in 1 week.  Diagnosis: Axis I: Schizoaffective Disorder      Tresa Res, LCSW 12/01/2013

## 2013-12-02 ENCOUNTER — Telehealth (HOSPITAL_COMMUNITY): Payer: Self-pay | Admitting: Psychiatry

## 2013-12-02 MED ORDER — TRAZODONE HCL 100 MG PO TABS: 150.0000 mg | ORAL_TABLET | Freq: Every day | ORAL | Status: DC

## 2013-12-02 NOTE — Telephone Encounter (Signed)
[facility] requesting refills, left message request Asenapine 10 mg BID samples. He has a prescription if needed.

## 2013-12-02 NOTE — Telephone Encounter (Signed)
I have already called the patient back.

## 2013-12-04 ENCOUNTER — Telehealth (HOSPITAL_COMMUNITY): Payer: Self-pay | Admitting: Psychiatry

## 2013-12-04 MED ORDER — FLUOXETINE HCL 20 MG PO CAPS: 60.0000 mg | ORAL_CAPSULE | Freq: Every day | ORAL | Status: DC

## 2013-12-04 MED ORDER — TRAZODONE HCL 100 MG PO TABS: 150.0000 mg | ORAL_TABLET | Freq: Every day | ORAL | Status: DC

## 2013-12-04 MED ORDER — ASENAPINE MALEATE 10 MG SL SUBL: 10.0000 mg | SUBLINGUAL_TABLET | Freq: Two times a day (BID) | SUBLINGUAL | Status: DC

## 2013-12-04 NOTE — Telephone Encounter (Signed)
Will provide refills of his medication.

## 2013-12-08 ENCOUNTER — Ambulatory Visit (INDEPENDENT_AMBULATORY_CARE_PROVIDER_SITE_OTHER): Payer: Self-pay | Admitting: Psychiatry

## 2013-12-08 DIAGNOSIS — F259 Schizoaffective disorder, unspecified: Secondary | ICD-10-CM

## 2013-12-08 NOTE — Progress Notes (Signed)
   THERAPIST PROGRESS NOTE  Session Time: 1:00-1:560 pm  Participation Level: Active  Behavioral Response: Neat and Well GroomedAlertAnxious  Type of Therapy: Individual Therapy  Treatment Goals addressed: Depression and Anxiety   Interventions: CBT, Solution Focused and Supportive  Summary: Ronald [name] is a 48 y.o. male who presents with mild anxiety and reports his anxiety has dropped significantly to a 3/10 from a 8/10 from last session. Pt said he believes the regular working out at the gym along with his medications are helping. Pt said his main stressors continue to be worrying about how he will pay his credit card bill which will be past due ninety days in a few weeks. Pt processed fears associated with going to collections but said he is learning to manage the thoughts more effectively to reduce feelings of anxiety. 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 Pt was receptive to discussing this in session. 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 Pt struggled to see the steps and barriers to working in this profession without a degree or any previous experience. Pt said he used to work with the vocational rehabilitation center to have them help with the steps of gaining employment, but he became overwhelmed when they were pushing him too fast. Pt struggles to see the steps on how to gain employment and can only focus on his big picture goals. Pt left session in the middle to use the restroom for the second time in a row. Pt reports stable mood and said he feels "very good". He denied any active paranoia, depression and stated minimal anxiety symptoms.   Suicidal/Homicidal: No  Therapist Response: Assessed overall level of functioning per Pt self report. Challenged grandiose thought patterns through using  problem solution skills, demonstrated effective communication to use when applying for jobs, encouraged follow up with the [place] job he applied for, processed feelings regarding credit card debt and and explored coping skills being used to manage anxiety. Encouraged continued use of gym and medication management.   Plan: Return again in 1 week. Follow up with communication to help with job searching and anxiety management skills.   Diagnosis: Axis I: Schizoaffective Disorder       Tresa Res, LCSW 12/08/2013

## 2013-12-15 ENCOUNTER — Encounter (INDEPENDENT_AMBULATORY_CARE_PROVIDER_SITE_OTHER): Payer: Self-pay

## 2013-12-15 ENCOUNTER — Encounter (HOSPITAL_COMMUNITY): Payer: Self-pay | Admitting: Psychiatry

## 2013-12-15 ENCOUNTER — Ambulatory Visit (INDEPENDENT_AMBULATORY_CARE_PROVIDER_SITE_OTHER): Payer: Self-pay | Admitting: Psychiatry

## 2013-12-15 VITALS — BP 121/85 | HR 89 | Wt 200.0 lb

## 2013-12-15 DIAGNOSIS — F259 Schizoaffective disorder, unspecified: Secondary | ICD-10-CM

## 2013-12-15 MED ORDER — ASENAPINE MALEATE 10 MG SL SUBL: 10.0000 mg | SUBLINGUAL_TABLET | Freq: Two times a day (BID) | SUBLINGUAL | Status: DC

## 2013-12-15 NOTE — Progress Notes (Signed)
[facility] Follow-up Outpatient Visit  Ronald [name] 29-May-1966 [id number] 48 y.o. 12/15/2013 2:04 PM  Chief Complaint:  HPI Comments: Ronald [name] is  a 48 y/o male with a past psychiatric history significant for Schizoaffective Disorder. The patient is referred for psychiatric services for medication management.    . Location: The patient he continues anxiety related to his insurance, medications, and finances. He has not had and further episodes of psychosis or suicidal thoughts. . Quality:  The patient is still concerned about the process of applying for medicaid. He also reports concerns about getting his medications but is getting medications through [facility]. He reports he is taking his medications and denies any side effects.  In the area of affective symptoms, patient appears anxious and depressed. Patient denies current suicidal ideation, intent, or plan. Patient denies current homicidal ideation, intent, or plan. Patient denies auditory hallucinations. Patient denies visual hallucinations. Patient denies symptoms of paranoia, but endorses paranoia in the past about people "get him" he is not really sure why. Patient states sleep still poor, with regards to staying asleep but he will nap 1-2 hours in the afternoon. Appetite is fair but is eating 3 meals a day. Energy level is good. Patient denies symptoms of anhedonia. Patient denies hopelessness, helplessness, or guilt.    . Severity: Mild to moderate  . Duration: Patient has improved over the past month.  . Timing: No specific timimng  . Context: Finances and insurance  Collateral information from his mother reveal that the patient has been doing well. She would like them to meet twice daily to plans their day.   History of Present Illness: Suicidal Ideation: No Plan Formed: No Patient has means to carry out plan: No  Homicidal Ideation: Negative Plan Formed: Negative Patient has means to carry out plan:  Negative  Review of Systems: Psychiatric: Agitation: No-Minimal with interactions with his sister. Hallucination: Negative Depressed Mood: No Insomnia: Negative Hypersomnia: Negative Altered Concentration: Negative Feels Worthless: Negative Grandiose Ideas: Negative Belief In Special Powers: Negative New/Increased Substance Abuse: Negative Compulsions: Negative  Neurologic: Headache: Negative Seizure: Negative Paresthesias: Negative  Past Medical Family, Social History:  Past Medical History  Diagnosis Date  . Cancer     Skin  . Squamous cell skin cancer, ala nasi    Family History  Problem Relation Age of Onset  . Diabetes Mellitus II Brother   . Schizophrenia Maternal Uncle   . Alcohol abuse Neg Hx   . Anxiety disorder Neg Hx   . Bipolar disorder Neg Hx   . Dementia Neg Hx   . Depression Neg Hx   . Drug abuse Neg Hx   . Coronary artery disease Neg Hx    History   Social History  . Marital Status: Single    Spouse Name: N/A    Number of Children: N/A  . Years of Education: N/A   Social History Main Topics  . Smoking status: Former Smoker -- 1.00 packs/day    Types: Cigarettes    Quit date: 10/30/2008  . Smokeless tobacco: Never Used     Comment: ni  . Alcohol Use: No  . Drug Use: No  . Sexual Activity: No   Other Topics Concern  . None   Social History Narrative  . None    Outpatient Encounter Prescriptions as of 12/15/2013  Medication Sig  . Asenapine Maleate (SAPHRIS) 10 MG SUBL Place 1 tablet (10 mg total) under the tongue 2 (two) times daily.  Marland Kitchen  FLUoxetine (PROZAC) 20 MG capsule Take 3 capsules (60 mg total) by mouth daily.  . traZODone (DESYREL) 100 MG tablet Take 1.5 tablets (150 mg total) by mouth at bedtime.    Past Psychiatric History/Hospitalization(s): Anxiety: Negative Bipolar Disorder: Negative Depression: Negative Mania: Negative Psychosis: Negative Schizophrenia: Negative Personality Disorder: Negative Hospitalization for  psychiatric illness: No History of Electroconvulsive Shock Therapy: Negative Prior Suicide Attempts: Negative   Review of Systems  Constitutional: Negative for fever, chills, weight loss and malaise/fatigue.  HENT: Positive for tinnitus (Left ear and sometime in his right ear.).   Eyes: Negative for blurred vision, double vision and photophobia.  Respiratory: Negative for cough, hemoptysis, sputum production and shortness of breath.   Cardiovascular: Negative for chest pain, palpitations and leg swelling.  Gastrointestinal: Negative for heartburn, nausea, vomiting, abdominal pain, diarrhea and constipation.  Genitourinary: Negative for dysuria and urgency.  Skin: Negative for itching and rash.  Neurological: Negative for dizziness, tingling, focal weakness, seizures, loss of consciousness and headaches.   Filed Vitals:   12/15/13 1407  BP: 121/85  Pulse: 89  Weight: 200 lb (90.719 kg)    Physical Exam: Constitutional: General Appearance: alert, oriented, no acute distress and well nourished Musculoskeletal: Gait & Station: normal Patient leans: N/A  Psychiatric Specialty Exam: General Appearance: Casual and Well Groomed  Engineer, water::  Fair  Speech:  Clear and Coherent and Normal Rate  Volume:  Normal  Mood:  "better"  Affect:  Appropriate, Congruent and Full Range  Thought Process:  Coherent, Linear and Logical  Orientation:  Full (Time, Place, and Person)  Thought Content:  WDL  Suicidal Thoughts:  No  Homicidal Thoughts:  No  Memory:  Immediate;   Good Recent;   Good Remote;   Good  Judgement:  Fair  Insight:  Fair  Psychomotor Activity:  Normal  Concentration:  Good  Recall:  Negative  Akathisia:  Negative  Language-Intact  Fund of Knowledge-Average  Handed:  Right  AIMS (if indicated):   As noted in chart.  Assets:  Communication Skills Desire for Improvement Financial Resources/Insurance Intimacy Leisure Time Physical Health Resilience Social  Support Talents/Skills   Medical Decision Making (Choose Three): Review of Psycho-Social Stressors (1), Order AIMS Test (2), Established Problem, Worsening (2) and Review of Medication Regimen & Side Effects (2)  Assessment: Schizoaffective Disorder-improving  Axis I: Schizoaffective Disorder.  Plan:  Plan of Care:  PLAN:  1. Affirm with the patient that the medications are taken as ordered. Patient  expressed understanding of how their medications were to be used.    Laboratory:  No labs warranted at this time.   Psychotherapy: Therapy: brief supportive therapy provided.  Discussed psychosocial stressors in detail.  More than 50% of the visit was spent on individual therapy/counseling.   Medications:  Continue the following psychiatric medications as written prior to this appointment with the following changes::  a) Saphris 10 mg BID-Patient was informed Saphris may not always be available as samples and that e must find a way to get insurance to cover this. b) Prozac 60 mg daily C)  Continue trazodone 150 mg D) Mother will provide one week supply of medications at time. -Risks and benefits, side effects and alternatives discussed with patient, he was given an opportunity to ask questions about his medication, illness, and treatment. All current psychiatric medications have been reviewed and discussed with the patient and adjusted as clinically appropriate. The patient has been provided an accurate and updated list of the medications being now  prescribed.   Routine PRN Medications:  Negative  Consultations: The patient was encouraged to keep all PCP and specialty clinic appointments.   Safety Concerns:   Patient told to call clinic if any problems occur. Patient advised to go to  ER  if he should develop SI/HI, side effects, or if symptoms worsen. Has crisis numbers to call if needed.    Other:   8. Patient was instructed to return to clinic in 1 month.  9. The patient was advised  to call and cancel their mental health appointment within 24 hours of the appointment, if they are unable to keep the appointment, as well as the three no show and termination from clinic policy. 10. The patient expressed understanding of the plan and agrees with the above. 11 Advised patient I would no longer be at this practice after December 24, 2013.  Time Spent: 25 minutes  Coralyn Helling, M.D.  12/15/2013 2:04 PM

## 2013-12-18 ENCOUNTER — Telehealth (HOSPITAL_COMMUNITY): Payer: Self-pay

## 2013-12-18 DIAGNOSIS — F259 Schizoaffective disorder, unspecified: Secondary | ICD-10-CM

## 2013-12-18 MED ORDER — ASENAPINE MALEATE 10 MG SL SUBL: 10.0000 mg | SUBLINGUAL_TABLET | Freq: Two times a day (BID) | SUBLINGUAL | Status: DC

## 2013-12-18 NOTE — Telephone Encounter (Signed)
Called patient. He will need to stay at this clinic due to financial reasons and is his follow up appointment is unsure will Give 90 days of sapharis with 1 refill.

## 2013-12-19 ENCOUNTER — Telehealth (HOSPITAL_COMMUNITY): Payer: Self-pay | Admitting: Psychiatry

## 2013-12-19 DIAGNOSIS — F259 Schizoaffective disorder, unspecified: Secondary | ICD-10-CM

## 2013-12-19 MED ORDER — ASENAPINE MALEATE 10 MG SL SUBL: 10.0000 mg | SUBLINGUAL_TABLET | Freq: Two times a day (BID) | SUBLINGUAL | Status: DC

## 2013-12-20 NOTE — Telephone Encounter (Signed)
Called patient's mother. Answered her concerns.

## 2013-12-22 ENCOUNTER — Ambulatory Visit (INDEPENDENT_AMBULATORY_CARE_PROVIDER_SITE_OTHER): Payer: Self-pay | Admitting: Psychiatry

## 2013-12-22 ENCOUNTER — Encounter (INDEPENDENT_AMBULATORY_CARE_PROVIDER_SITE_OTHER): Payer: Self-pay

## 2013-12-22 DIAGNOSIS — F259 Schizoaffective disorder, unspecified: Secondary | ICD-10-CM

## 2013-12-22 NOTE — Progress Notes (Signed)
   THERAPIST PROGRESS NOTE  Session Time: 1:00-1:50 pm  Participation Level: Active  Behavioral Response: Casual, Neat and Well GroomedAlertAnxious  Type of Therapy: Individual Therapy  Treatment Goals addressed: Depression, Anxiety and Paranoia   Interventions: CBT, Solution Focused and Supportive  Summary: Ronald [name] is a 48 y.o. male who presents with mild anxious mood at a 4/10 (with 10 being high). Pt said he feels stable and the same level of functioning as last week. Pt said he notices an extreme reduction in his anxiety from his first session with wrier. Pts main stressor continues to be his anxiety around his credit card debt. He said he has not followed through with contacting his credit card company, which was his goal for himself from last week because he is not looking forward to the call and fears the outcome. Pt said he has to call by tomorrow because as of April 15 his credit card will be ninety days overdue. Pt talked about wanting a job quickly and thinking he would have had one by now to help him make the minium monthly payments. 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  Pt appeared to struggle with the job search and application process, but also expressed concerns about not wanting to work with [facility] again because "they moved too quickly for him at finding him employment". Pt appears to get overwhelmed with the process of things and said he likes to work at "his own pace". Pt said it has been eight years since he has had a job. Pt used part of the session to prepare for his phone call he plans to make to the credit card company tomorrow to help him manage his anxiety symptoms more effectively.  Pt denied active depression or paranoia and indicated minimal anxiety symptoms.    Suicidal/Homicidal: No  Therapist Response: Assessed overall level of functioning and paranoia, explored barriers to meeting his goals  he set for himself for last week, discussed goals for this week and explored ways for Pt to be successful in completing them, role modeled and practiced communication skills and reviewed progress of therapy sessions in helping Pt increase overall wellness.   Plan: Return again in 1 weeks. Follow up on calling credit card company, mailing letter for skin center and job hunting.   Diagnosis: Axis I: Scizoaffective Disorder      Tresa Res, LCSW 12/22/2013

## 2013-12-29 ENCOUNTER — Ambulatory Visit (HOSPITAL_COMMUNITY): Payer: Self-pay | Admitting: Psychiatry

## 2013-12-31 ENCOUNTER — Telehealth (HOSPITAL_COMMUNITY): Payer: Self-pay

## 2013-12-31 NOTE — Telephone Encounter (Signed)
Writer spoke with Ronald [name] and completed a PHQ-9 depression assessment to determine the severity of his depression symptoms. Pt scored a 6/27 indicating mild depression symptoms. Pt said he does not feel depressed, but is more anxious after receiving a phone call from vocational rehabilitation yesterday. Pt wants to use the session tomorrow to follow up with them to see what they want. Pt denied any thought of self-harm and said he feels stabilized. Writer informed Pt to tell his mom, who Pt said was out of the house currently, that this call was made.

## 2014-01-01 ENCOUNTER — Encounter (INDEPENDENT_AMBULATORY_CARE_PROVIDER_SITE_OTHER): Payer: Self-pay

## 2014-01-01 ENCOUNTER — Ambulatory Visit (INDEPENDENT_AMBULATORY_CARE_PROVIDER_SITE_OTHER): Payer: Self-pay | Admitting: Psychiatry

## 2014-01-01 DIAGNOSIS — F259 Schizoaffective disorder, unspecified: Secondary | ICD-10-CM

## 2014-01-01 NOTE — Progress Notes (Signed)
   THERAPIST PROGRESS NOTE  Session Time: 3:00-3:50 pm  Participation Level: Active  Behavioral Response: Neat and Well GroomedAlertAnxious  Type of Therapy: Individual Therapy  Treatment Goals addressed: Anxiety  Interventions: CBT, Solution Focused and Supportive  Summary: Ronald [name] is a 48 y.o. male who presents with moderate anxious mood and affect. Pt said he did not follow through on calling his credit card company last week due to high anxiety and paranoia. Pt was also paranoid about a letter he received from [facility]. Pt said he wanted to use the counseling session to contact both. Pt practiced his communication skills before each call to determine what he should say. Pt struggled with confidence and knowing how to communicate. Pt was very anxious during each call. Pt processed concerns that he will be prosecuted by [facility] for noncompliance in the past. Pt still had those fears after hearing from them that they do no prosecute. Pt said he still wants to work with them. Pt called [employer] card to identify options for paying down his $18,000 debt. Pt became overwhelmed on the phone and asked for support of Probation officer. Pt is going to talk to his mom about the options and call them back with one he feels is doable for him. Pt said he wants employment, but struggles with basic socialization skills and paranoid and grandiose thinking.   Suicidal/Homicidal: No  Therapist Response: Assessed overall level of functioning, processed stressors, problem solved effective communication techniques, helped Pt advocate for himself and identified barriers to following through on personal goals for self.   Plan: Return again in 1 weeks.  Diagnosis: Axis I: schizoaffective disorder        Tresa Res, LCSW 01/01/2014

## 2014-01-05 ENCOUNTER — Ambulatory Visit (INDEPENDENT_AMBULATORY_CARE_PROVIDER_SITE_OTHER): Payer: Self-pay | Admitting: Psychiatry

## 2014-01-05 ENCOUNTER — Encounter (INDEPENDENT_AMBULATORY_CARE_PROVIDER_SITE_OTHER): Payer: Self-pay

## 2014-01-05 DIAGNOSIS — F259 Schizoaffective disorder, unspecified: Secondary | ICD-10-CM

## 2014-01-05 NOTE — Progress Notes (Signed)
   THERAPIST PROGRESS NOTE  Session Time: 1:00-1:50 pm  Participation Level: Active  Behavioral Response: CasualAlertAnxious  Type of Therapy: Individual Therapy  Treatment Goals addressed: Anxiety and Paranoia  Interventions: CBT and Solution Focused  Summary: Ronald [name] is a 48 y.o. male who presents with mild anxious mood and paranoid thought process. Pt said he followed through on talking with his mother and the credit card company following last weeks session and was happy to report he worked out a deal so he is on a five year payment plan with the credit card company to avoid him going to collections and about $7,500 of debt will be written off. Pt said his mother agreed to pay the monthly payments. Pt said he also talked with [facility] and no longer feels they are going to prosecute him for not following through with finding a job two years ago. Pt said he knows he procrastinates and avoids confronting things that scare him but he said he is not sure he would do things differently if another stressful situation presented itself. Pt said he is currently stressed about an upcoming skin surgery he has to remove facial cancer on his noise. Pt said he has several worries and questions about the surgery including will he lose his entire nose and looked deformed. Pt struggled to apply the tool of calling them and asking questions to reduce his anxiety. Pt was open to brainstorming questions to ask the surgical center and Pt agreed to call them today to have them answer all his questions as a way to reduce some of his anxious and paranoid thought processes's about the surgery. Pt said he is still trying to find a job and he has resumed his relationship with vocational rehabilitation to have them assist him with this process.   Suicidal/Homicidal:No  Therapist Response: Assessed overall level of functioning and paranoia, reviewed status since last session with credit card and  vocational rehabilitation, focused on helping Pt gain insight into his tendency to procrastinate and how this increases paranoia and anxiety, problem solved ways to fix anxiety provoking situations as they occur and challenge his tendency to procrastinate, assigned homework of calling surgical center to have his questions answered before next session.   Plan: Return again in 1 weeks. Review homework.   Diagnosis: Axis I: Schizoaffective Disorder        Tresa Res, LCSW 01/05/2014

## 2014-01-12 ENCOUNTER — Encounter (INDEPENDENT_AMBULATORY_CARE_PROVIDER_SITE_OTHER): Payer: Self-pay

## 2014-01-12 ENCOUNTER — Ambulatory Visit (INDEPENDENT_AMBULATORY_CARE_PROVIDER_SITE_OTHER): Payer: Self-pay | Admitting: Psychiatry

## 2014-01-12 DIAGNOSIS — F259 Schizoaffective disorder, unspecified: Secondary | ICD-10-CM

## 2014-01-12 NOTE — Progress Notes (Signed)
   THERAPIST PROGRESS NOTE  Session Time: 1:00-1:50 pm  Participation Level: Active  Behavioral Response: Neat and Well GroomedAlertEuthymic  Type of Therapy: Individual Therapy  Treatment Goals addressed: Paranoia and anxiety  Interventions: CBT and Solution Focused  Summary: Ronald [name] is a 48 y.o. male who presents with elevated mood and affect with minimal paranoia symptoms. Pt reports feeling very good and rated his wellness an 8/10 with 10 being optimal wellness. Pt said he followed up with his homework of contacting his surgical center where is scheduled to have surgery tomorrow on his nose to remove skin cancer cells to reduce his anxiety through asking questions regarding the procedure. Pt said his nerves were mostly put at ease after learning more about the procedure. Pt acknowledges that his thoughts turn to scare, worst case scenarios often when confronted with confusing or stressful situations and he is aware that he avoids seeking clarification due to fearing the answers are worse than what he anticipates might happen. Pt reviewed the times recently where he confronted stressful situations and confronting it worked to reduce his anxiety. Pt is still working on this skills. Pt said he was very excited to report that he met with [facility] this past week and they found him a part time job working for [employer] in [employer]. Pt smiled and said he was looking forward to it. Pt processed stressors associated with returning to work for the first time in over seven years and brainstormed ways to make this transition a successful one. Pt agreed to discuss any stressors that may present during counseling session instead of avoiding them.    Suicidal/Homicidal: No   Therapist Response: Assessed overall level of functioning and active anxiety and paranoia symptoms. reviewed progress with using the new skill of seeking clarification to anxiety provoking situations  and explored how this is reducing his anxiety level, processed feelings associated with upcoming surgery, discussed Pts new job and ways to be successful returning to the work force.   Plan: Return again in 1 week. Follow up with surgery and continue discussing ways to prepare for new job.   Diagnosis: Axis I: Schizoaffective Disorder        Tresa Res, LCSW 01/12/2014

## 2014-01-16 ENCOUNTER — Encounter: Payer: Self-pay | Admitting: Family Medicine

## 2014-01-16 DIAGNOSIS — Z85828 Personal history of other malignant neoplasm of skin: Secondary | ICD-10-CM | POA: Insufficient documentation

## 2014-01-19 ENCOUNTER — Ambulatory Visit (INDEPENDENT_AMBULATORY_CARE_PROVIDER_SITE_OTHER): Payer: Self-pay | Admitting: Psychiatry

## 2014-01-19 DIAGNOSIS — F259 Schizoaffective disorder, unspecified: Secondary | ICD-10-CM

## 2014-01-19 NOTE — Progress Notes (Signed)
   THERAPIST PROGRESS NOTE  Session Time: 1:00-1:50 pm  Participation Level: Active  Behavioral Response: Casual, Neat and Well GroomedAlertEuthymic  Type of Therapy: Individual Therapy  Treatment Goals addressed: Anxiety and Paranoia  Interventions: CBT, Solution Focused and Supportive  Summary: Ronald [name] is a 48 y.o. male who presents with euthymic mood and affect. Pt reports no active paranoia symptoms and stable mood with minimal anxiety. Pt said his nose surgery went well and he has mild anxiety about the healing process with regards to scaring. Pt said he is looking forward to starting his job on Wednesday and is planning on doing a practice run tomorrow to see how it goes and also has been practicing getting up earlier to get into the new scheduling routine. Pt processed feelings regarding starting the job and explored behaviors that will help this transition be a more successful one. Pt said he is looking forward to having his own income.    Suicidal/Homicidal: No   Therapist Response: Assessed overall level of anxiety and paranoia, processed feelings regarding recent surgery and upcoming job, explored ways for pt to have this new job be a successful transition.   Plan: Return again in 1 weeks.  Diagnosis: Axis I: Schizoaffective Disorder        Tresa Res, LCSW 01/19/2014

## 2014-01-26 ENCOUNTER — Telehealth (HOSPITAL_COMMUNITY): Payer: Self-pay

## 2014-01-27 ENCOUNTER — Telehealth (HOSPITAL_COMMUNITY): Payer: Self-pay | Admitting: Psychiatry

## 2014-01-27 NOTE — Telephone Encounter (Signed)
See note

## 2014-01-27 NOTE — Telephone Encounter (Signed)
Writer informed Pt of the previous discussed plan to contact [facility] in [place] for ongoing counseling sessions or Pt can schedule with the new therapist coming on board in June to the [place] office. Pt was reminded of the crisis contact numbers and his crisis plan in case of emergency and instructed to call the [facility] office if he needs further assistance.

## 2014-01-30 ENCOUNTER — Telehealth (HOSPITAL_COMMUNITY): Payer: Self-pay

## 2014-01-30 ENCOUNTER — Ambulatory Visit (INDEPENDENT_AMBULATORY_CARE_PROVIDER_SITE_OTHER): Payer: Self-pay | Admitting: Psychiatry

## 2014-01-30 DIAGNOSIS — F259 Schizoaffective disorder, unspecified: Secondary | ICD-10-CM

## 2014-01-30 MED ORDER — FLUOXETINE HCL 20 MG PO CAPS
60.0000 mg | ORAL_CAPSULE | Freq: Every day | ORAL | Status: DC
Start: 2014-01-30 — End: 2014-08-03

## 2014-01-30 MED ORDER — ASENAPINE MALEATE 10 MG SL SUBL: 10.0000 mg | SUBLINGUAL_TABLET | Freq: Two times a day (BID) | SUBLINGUAL | Status: DC

## 2014-01-30 MED ORDER — TRAZODONE HCL 100 MG PO TABS: 150.0000 mg | ORAL_TABLET | Freq: Every day | ORAL | Status: DC

## 2014-01-30 NOTE — Progress Notes (Signed)
Patient ID: [name], male   DOB: 08-10-1966, 48 y.o.   MRN: [id number]   [name] Follow-up Outpatient Visit  Ronald [name] 1966-04-03 [id number] 48 y.o. 01/30/2014 12:57 PM  Chief Complaint:  HPI Comments: Ronald [name] is  a 48 y/o male with a past psychiatric history significant for Schizoaffective Disorder. The patient is referred for psychiatric services for medication management.  Patient started having paranoid delusions in 2009. He was feeling [name] the [place] along with aliens are out against his country. He he started getting help into trouble His medications in the beginning with Risperdal the did not help and then it was changed to assess the status it helpful.  Trazodone helps him sleep he has to make up a little early in the morning so he goes to bed around 8 PM  He understands saphenous can cause sedation but despite he takes it twice a day and it does not cause sedation during the day continues to take Prozac for the depression program regarding depression stating that her husband was hopelessness helplessness he was to continue current medication regimen as it has been helpful no involuntary movements noticeable.  . Location: The patient is doing better regarding paranoia and delusions. Less depressed and no suicidal thoughts. . Quality:  The patient is still concerned about the process of applying for medicaid. He also reports concerns about getting his medications but is getting medications through [facility]. He reports he is taking his medications and denies any side effects.  In the area of affective symptoms, patient appears anxious and depressed. Patient denies current suicidal ideation, intent, or plan. Patient denies current homicidal ideation, intent, or plan. Patient denies auditory hallucinations. Patient denies visual hallucinations. Patient denies symptoms of paranoia, but endorses paranoia in the past about people "get him" he is not really  sure why. Patient states sleep still poor, with regards to staying asleep but he will nap 1-2 hours in the afternoon. Appetite is fair but is eating 3 meals a day. Energy level is good. Patient denies symptoms of anhedonia. Patient denies hopelessness, helplessness, or guilt.    . Severity: Mild to moderate  . Duration: Patient has improved over the past month.  . Timing: No specific timimng  . Context: Finances and insurance  Collateral information from his mother reveal that the patient has been doing well. She would like them to meet twice daily to plans their day.   History of Present Illness: Suicidal Ideation: No Plan Formed: No Patient has means to carry out plan: No  Homicidal Ideation: Negative Plan Formed: Negative Patient has means to carry out plan: Negative  Review of Systems: Psychiatric: Agitation: No-Minimal with interactions with his sister. Hallucination: Negative Depressed Mood: No Insomnia: Negative Hypersomnia: Negative Altered Concentration: Negative Feels Worthless: Negative Grandiose Ideas: Negative Belief In Special Powers: Negative New/Increased Substance Abuse: Negative Compulsions: Negative  Neurologic: Headache: Negative Seizure: Negative Paresthesias: Negative  Past Medical Family, Social History:  Past Medical History  Diagnosis Date  . Cancer     Skin  . Squamous cell skin cancer, ala nasi    Family History  Problem Relation Age of Onset  . Diabetes Mellitus II Brother   . Schizophrenia Maternal Uncle   . Alcohol abuse Neg Hx   . Anxiety disorder Neg Hx   . Bipolar disorder Neg Hx   . Dementia Neg Hx   . Depression Neg Hx   . Drug abuse Neg Hx   . Coronary artery disease  Neg Hx    History   Social History  . Marital Status: Single    Spouse Name: N/A    Number of Children: N/A  . Years of Education: N/A   Social History Main Topics  . Smoking status: Former Smoker -- 1.00 packs/day    Types: Cigarettes    Quit date:  10/30/2008  . Smokeless tobacco: Never Used     Comment: ni  . Alcohol Use: No  . Drug Use: No  . Sexual Activity: No   Other Topics Concern  . Not on file   Social History Narrative  . No narrative on file    Outpatient Encounter Prescriptions as of 01/30/2014  Medication Sig  . [START ON 03/19/2014] Asenapine Maleate (SAPHRIS) 10 MG SUBL Place 1 tablet (10 mg total) under the tongue 2 (two) times daily.  Marland Kitchen FLUoxetine (PROZAC) 20 MG capsule Take 3 capsules (60 mg total) by mouth daily.  . traZODone (DESYREL) 100 MG tablet Take 1.5 tablets (150 mg total) by mouth at bedtime.  . [DISCONTINUED] Asenapine Maleate (SAPHRIS) 10 MG SUBL Place 1 tablet (10 mg total) under the tongue 2 (two) times daily.  . [DISCONTINUED] FLUoxetine (PROZAC) 20 MG capsule Take 3 capsules (60 mg total) by mouth daily.  . [DISCONTINUED] traZODone (DESYREL) 100 MG tablet Take 1.5 tablets (150 mg total) by mouth at bedtime.    Past Psychiatric History/Hospitalization(s): Anxiety: Negative Bipolar Disorder: Negative Depression: Negative Mania: Negative Psychosis: Negative Schizophrenia: Negative Personality Disorder: Negative Hospitalization for psychiatric illness: No History of Electroconvulsive Shock Therapy: Negative Prior Suicide Attempts: Negative   Review of Systems  Constitutional: Negative for fever, chills, weight loss and malaise/fatigue.  HENT: Positive for tinnitus (Left ear and sometime in his right ear.).   Eyes: Negative for blurred vision, double vision and photophobia.  Respiratory: Negative for cough, hemoptysis, sputum production and shortness of breath.   Cardiovascular: Negative for chest pain, palpitations and leg swelling.  Gastrointestinal: Negative for heartburn, nausea, vomiting, abdominal pain, diarrhea and constipation.  Genitourinary: Negative for dysuria and urgency.  Skin: Negative for itching and rash.  Neurological: Negative for dizziness, tingling, focal weakness,  seizures, loss of consciousness and headaches.   There were no vitals filed for this visit.  Physical Exam: Constitutional: General Appearance: alert, oriented, no acute distress and well nourished Musculoskeletal: Gait & Station: normal Patient leans: N/A  Psychiatric Specialty Exam: General Appearance: Casual and Well Groomed  Engineer, water::  Fair  Speech:  Clear and Coherent and Normal Rate  Volume:  Normal  Mood:  "better"  Affect:  Appropriate, Congruent and Full Range  Thought Process:  Coherent, Linear and Logical  Orientation:  Full (Time, Place, and Person)  Thought Content:  WDL  Suicidal Thoughts:  No  Homicidal Thoughts:  No  Memory:  Immediate;   Good Recent;   Good Remote;   Good  Judgement:  Fair  Insight:  Fair  Psychomotor Activity:  Normal  Concentration:  Good  Recall:  Negative  Akathisia:  Negative  Language-Intact  Fund of Knowledge-Average  Handed:  Right  AIMS (if indicated):   As noted in chart.  Assets:  Communication Skills Desire for Improvement Financial Resources/Insurance Intimacy Leisure Time Physical Health Resilience Social Support Talents/Skills   Medical Decision Making (Choose Three): Review of Psycho-Social Stressors (1), Order AIMS Test (2), Established Problem, Worsening (2) and Review of Medication Regimen & Side Effects (2)  Assessment: Schizoaffective Disorder-improving  Axis I: Schizoaffective Disorder.  Plan:  Plan of  Care:  PLAN:  1. Affirm with the patient that the medications are taken as ordered. Patient  expressed understanding of how their medications were to be used.    Laboratory:  No labs warranted at this time.   Psychotherapy: Therapy: brief supportive therapy provided.  Discussed psychosocial stressors in detail.  More than 50% of the visit was spent on individual therapy/counseling.   Medications:  Continue the following psychiatric medications as written prior to this appointment with the  following changes::  a) Saphris 10 mg BID-Patient was informed Saphris may not always be available as samples and that e must find a way to get insurance to cover this. b) Prozac 60 mg daily C)  Continue trazodone 150 mg D) Mother will provide one week supply of medications at time. He may get 90 day supply of medications from his pharmacy but his mother supplies limited amount. He understands the risks and will not keep medications with him. -Risks and benefits, side effects and alternatives discussed with patient, he was given an opportunity to ask questions about his medication, illness, and treatment. All current psychiatric medications have been reviewed and discussed with the patient and adjusted as clinically appropriate. The patient has been provided an accurate and updated list of the medications being now prescribed.   Routine PRN Medications:  Negative  Consultations: The patient was encouraged to keep all PCP and specialty clinic appointments.   Safety Concerns:   Patient told to call clinic if any problems occur. Patient advised to go to  ER  if he should develop SI/HI, side effects, or if symptoms worsen. Has crisis numbers to call if needed.    Other:   I will write down for Fasting lipid profile and basic metabolic labs. He understands the risk of medications and side effects . None as of now. 8. Patient was instructed to return to clinic in 3 months.  9. The patient was advised to call and cancel their mental health appointment within 24 hours of the appointment, if they are unable to keep the appointment, as well as the three no show and termination from clinic policy. 10. The patient expressed understanding of the plan and agrees with the above. 11 Advised patient I would no longer be at this practice after December 24, 2013.  Time Spent: 25 minutes   01/30/2014 12:57 PM

## 2014-02-01 NOTE — Telephone Encounter (Signed)
Ok will call him May 29th. Patient states its non emergent and can wait till my next visit to clinic.

## 2014-02-06 NOTE — Progress Notes (Signed)
Advise patient to get labs done. 

## 2014-02-06 NOTE — Progress Notes (Signed)
Advise patient to get labs done.

## 2014-02-06 NOTE — Telephone Encounter (Signed)
Patient had a question if he can have a  Drink or two on weekends while he is taking his medications. I have responded and informed not to drink with these medications. Trazadone and saphris are itself sedating and is not adviseable to combine alcohol and medications.

## 2014-04-01 ENCOUNTER — Ambulatory Visit (INDEPENDENT_AMBULATORY_CARE_PROVIDER_SITE_OTHER): Payer: Self-pay | Admitting: Professional Counselor

## 2014-04-01 ENCOUNTER — Encounter (INDEPENDENT_AMBULATORY_CARE_PROVIDER_SITE_OTHER): Payer: Self-pay

## 2014-04-01 DIAGNOSIS — F259 Schizoaffective disorder, unspecified: Secondary | ICD-10-CM

## 2014-04-01 DIAGNOSIS — F258 Other schizoaffective disorders: Principal | ICD-10-CM

## 2014-04-01 NOTE — Progress Notes (Signed)
   THERAPIST PROGRESS NOTE  Session Time: 2:00-2:30pm  Participation Level: Active  Behavioral Response: Well GroomedAlertEuthymic  Type of Therapy: Individual Therapy  Treatment Goals addressed: Diagnosis: Schizoaffective: Review of past treatment plan.  Interventions: Motivational Interviewing and Supportive  Summary: Ronald [name] is a 48 y.o. male who presents with rigid thinking and flat affect.  Ronald [name] is very well groomed and polite during session as review of his treatment plan was addressed. Ronald [name] reports he has met all his goals with previous therapist and not sure what direction therapy should continue. He discussed obtaining his new part time job at [employer] and plans for applying full time along with getting another part time job at [employer]. He discussed how he is able to pay his bills, continues to live with his mother and receive support. His main goals are to continue his employment and meet his goals for retirement.    Suicidal/Homicidal: Nowithout intent/plan  Therapist Response: Ronald [name] was very guarded and polarized in his thinking unable to discuss alternative outcomes and issues.  LCSW assessed paranoia, anxiety, and depression in which patient reports all problems solved.  Paranoia was not present, however patient was observed having grandiose thinking with wanting to obtain multiple jobs and invest in different activities. Reframing of questions and alterative outcomes were projected at patient who deflected and ruminated his thoughts regarding the same outcomes with no changes.  Patient does not appear to have needs for therapy per his report and not reporting any issues or stressors. Reports sleep has improved and no report of AVH.  LCSW will updated treatment plan for patient to maintain stability with job and self care.  Plan: Return again in 1 month  Diagnosis: Axis I: Schizoaffective Disorder    Axis II: Deferred    Lilly [place], LCSW 04/01/2014

## 2014-04-10 ENCOUNTER — Encounter (HOSPITAL_COMMUNITY): Payer: Self-pay

## 2014-05-04 ENCOUNTER — Ambulatory Visit (HOSPITAL_COMMUNITY): Payer: Self-pay | Admitting: Psychiatry

## 2014-05-05 ENCOUNTER — Ambulatory Visit (INDEPENDENT_AMBULATORY_CARE_PROVIDER_SITE_OTHER): Payer: Self-pay | Admitting: Professional Counselor

## 2014-05-05 ENCOUNTER — Encounter (INDEPENDENT_AMBULATORY_CARE_PROVIDER_SITE_OTHER): Payer: Self-pay

## 2014-05-05 ENCOUNTER — Encounter (HOSPITAL_COMMUNITY): Payer: Self-pay | Admitting: Professional Counselor

## 2014-05-05 DIAGNOSIS — F259 Schizoaffective disorder, unspecified: Secondary | ICD-10-CM

## 2014-05-05 DIAGNOSIS — F258 Other schizoaffective disorders: Principal | ICD-10-CM

## 2014-05-05 NOTE — Progress Notes (Signed)
   THERAPIST PROGRESS NOTE  Session Time: 1:00-1:30pm  Participation Level: Active  Behavioral Response: CasualAlertEuthymic  Type of Therapy: Individual Therapy  Treatment Goals addressed: Anxiety  Interventions: Solution Focused  Summary: Ronald [name] is a 48 y.o. male who presents with euthymic mood will little to no anxiety noted.  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  [name] reports this would be a great opportunity to increase his income with his goal remaining to stay financially stable and get himself out of credit card debit.  He reports his car has been having some problems, but worry is low at a 2/10 with no panic attacks or decrease in daily functioning.  Overall he has improved with his mood, compliance with medication, he has been going to the gym every other day, and still looking for a part time job.  He reports no new stressors, no sleep problems, no AVH, nor suicidal ideation.  He smiles during session and is less flat as he presented in the previous session.  He reports he is ready to take a break from therapy and LCSW discussed the termination process, safety planned with patient, and discussed opportunities for referrals.  He is agreeable to follow up after listing his triggers or reporting he is regressing.  At this time he is very stable and doing well AEB self report, observation, and increased activities/future oriented thinking.   No safety concerns at this time. Patient meets criteria for discharge from outpatient services as he has met his treatment goals of: Depression, Anxiety, and Paranoia.   Suicidal/Homicidal: Nowithout intent/plan  Plan: Patient to be discharged at this time from outpatient therapy due to goal completion and patient reporting stability and safety. He is reporting no new goals for therapy nor stressors at this time.  He was given  information for referral sources due to current therapist going out on maternity leave.  He is agreeable to call if in crisis. He will continue to follow up with current psychiatrist at [facility].  Diagnosis: Axis I: Schizoaffective Disorder    Axis II: Deferred    [place], LCSW 05/05/2014

## 2014-05-08 ENCOUNTER — Encounter (HOSPITAL_COMMUNITY): Payer: Self-pay | Admitting: Psychiatry

## 2014-05-08 ENCOUNTER — Encounter (INDEPENDENT_AMBULATORY_CARE_PROVIDER_SITE_OTHER): Payer: Self-pay

## 2014-05-08 ENCOUNTER — Ambulatory Visit (INDEPENDENT_AMBULATORY_CARE_PROVIDER_SITE_OTHER): Payer: Self-pay | Admitting: Psychiatry

## 2014-05-08 VITALS — BP 145/78 | HR 92 | Ht 71.0 in | Wt 208.0 lb

## 2014-05-08 DIAGNOSIS — F251 Schizoaffective disorder, depressive type: Secondary | ICD-10-CM

## 2014-05-08 DIAGNOSIS — F258 Other schizoaffective disorders: Principal | ICD-10-CM

## 2014-05-08 DIAGNOSIS — F259 Schizoaffective disorder, unspecified: Secondary | ICD-10-CM

## 2014-05-08 MED ORDER — ASENAPINE MALEATE 5 MG SL SUBL: SUBLINGUAL_TABLET | SUBLINGUAL | Status: DC

## 2014-05-08 NOTE — Progress Notes (Signed)
Patient ID: Ronald [name], male   DOB: 1965/12/02, 48 y.o.   MRN: [id number]   [place] Follow-up Outpatient Visit  Ronald [name] 1965-10-13 [id number] 48 y.o. 05/08/2014 11:45 AM  Chief Complaint:  HPI Comments: [name] is  a 48 y/o male with a past psychiatric history significant for Schizoaffective Disorder. The patient is referred for psychiatric services for medication management.  Patient started having paranoid delusions in 2009. He was feeling [name] the [place] along with aliens are out against his country. He he started getting help into trouble His medications in the beginning with Risperdal  did not help and then it was changed to [place].  Trazodone helps him sleep he has to make up a little early in the morning so he goes to bed around 8 PM. He is not taking it on regular basis.   He continues to take Saphris at a dose of 10 mg during the day. And Prozac for depression. Does not endorse any hopelessness or recurrence of delusions. He's not having any involuntary movements. AI MS test is 0. He wants to cut down his Saphris.  He is working part-time does not endorse any ongoing paranoia or significant depressive symptoms. Says that he can keep an eye on his symptoms if he cut down the dose lowered there is any recurrence or paranoia he will call us.  . Location: The patient is doing better regarding paranoia and delusions. Less depressed and no suicidal thoughts. . Quality:  The patient is still concerned about the process of applying for medicaid. He also reports concerns about getting his medications but is getting medications through [facility]. He reports he is taking his medications and denies any side effects.  In the area of affective symptoms, patient appears anxious and not depressed. Patient denies current suicidal ideation, intent, or plan. Patient denies current homicidal ideation, intent, or plan. Patient denies auditory hallucinations. Patient  denies visual hallucinations. Patient denies symptoms of paranoia, but endorses paranoia in the past about people "get him" he is not really sure why.   . Severity: Mild to moderate  . Duration: Patient has improved over the past month.  . Timing: No specific timimng  . Context: Finances and insurance  Collateral information from his mother reveal that the patient has been doing well. She would like them to meet twice daily to plans their day.   History of Present Illness: Suicidal Ideation: No Plan Formed: No Patient has means to carry out plan: No  Homicidal Ideation: Negative Plan Formed: Negative Patient has means to carry out plan: Negative   Headache: Negative Seizure: Negative Paresthesias: Negative  Past Medical Family, Social History:  Past Medical History  Diagnosis Date  . Cancer     Skin  . Squamous cell skin cancer, ala nasi    Family History  Problem Relation Age of Onset  . Diabetes Mellitus II Brother   . Schizophrenia Maternal Uncle   . Alcohol abuse Neg Hx   . Anxiety disorder Neg Hx   . Bipolar disorder Neg Hx   . Dementia Neg Hx   . Depression Neg Hx   . Drug abuse Neg Hx   . Coronary artery disease Neg Hx    History   Social History  . Marital Status: Single    Spouse Name: N/A    Number of Children: N/A  . Years of Education: N/A   Social History Main Topics  . Smoking status: Former Smoker -- 1.00 packs/day  Types: Cigarettes    Quit date: 10/30/2008  . Smokeless tobacco: Never Used     Comment: ni  . Alcohol Use: No  . Drug Use: No  . Sexual Activity: No   Other Topics Concern  . None   Social History Narrative  . None    Outpatient Encounter Prescriptions as of 05/08/2014  Medication Sig  . asenapine (SAPHRIS) 5 MG SUBL 24 hr tablet One tablet during the day. 2 at night.  Marland Kitchen FLUoxetine (PROZAC) 20 MG capsule Take 3 capsules (60 mg total) by mouth daily.  . traZODone (DESYREL) 100 MG tablet Take 1.5 tablets (150 mg total)  by mouth at bedtime.  . [DISCONTINUED] Asenapine Maleate (SAPHRIS) 10 MG SUBL Place 1 tablet (10 mg total) under the tongue 2 (two) times daily.     Review of Systems  Constitutional: Negative for weight loss and malaise/fatigue.  HENT: Positive for tinnitus (Left ear and sometime in his right ear.).   Eyes: Negative for blurred vision, double vision and photophobia.  Respiratory: Negative for cough and hemoptysis.   Cardiovascular: Negative for chest pain, palpitations and leg swelling.  Gastrointestinal: Negative.   Genitourinary: Negative for dysuria and urgency.  Skin: Negative for itching and rash.  Neurological: Negative for dizziness, tingling, focal weakness, seizures, loss of consciousness and headaches.  Psychiatric/Behavioral: Negative for depression, suicidal ideas, hallucinations and substance abuse. The patient is nervous/anxious.    Filed Vitals:   05/08/14 1130  BP: 145/78  Pulse: 92  Height: 5\' 11"  (1.803 m)  Weight: 208 lb (94.348 kg)    Physical Exam: Constitutional: General Appearance: alert, oriented, no acute distress and well nourished Musculoskeletal: Gait & Station: normal Patient leans: N/A  Psychiatric Specialty Exam: General Appearance: Casual and Well Groomed  Engineer, water::  Fair  Speech:  Clear and Coherent and Normal Rate  Volume:  Normal  Mood:  "better"  Affect:  Appropriate, Congruent and Full Range  Thought Process:  Coherent, Linear and Logical  Orientation:  Full (Time, Place, and Person)  Thought Content:  WDL  Suicidal Thoughts:  No  Homicidal Thoughts:  No  Memory:  Immediate;   Good Recent;   Good Remote;   Good  Judgement:  Fair  Insight:  Fair  Psychomotor Activity:  Normal  Concentration:  Good  Recall:  Negative  Akathisia:  Negative  Language-Intact  Fund of Knowledge-Average  Handed:  Right  AIMS (if indicated):   As noted in chart.  Assets:  Communication Skills Desire for Improvement Financial  Resources/Insurance Intimacy Leisure Time Physical Health Resilience Social Support Talents/Skills   Medical Decision Making (Choose Three): Review of Psycho-Social Stressors (1), Order AIMS Test (2), Established Problem, Worsening (2) and Review of Medication Regimen & Side Effects (2)  Assessment: Schizoaffective Disorder-improving  Axis I: Schizoaffective Disorder.  Plan:  Plan of Care:  PLAN:  1. Affirm with the patient that the medications are taken as ordered. Patient  expressed understanding of how their medications were to be used.    Laboratory:  No labs warranted at this time.   Psychotherapy: Therapy: brief supportive therapy provided.  Discussed psychosocial stressors in detail.  More than 50% of the visit was spent on individual therapy/counseling.   Medications:  Continue the following psychiatric medications as written prior to this appointment with the following changes::  Decrease his Saphris 5 mg sublingual during the day and 10 mg at night. Continue Prozac.Hold  Off trazodone discussed sleep .  Advised at last but he states  that he cannot afford it. He understands the risk of having high cholesterol or elevated blood glucose considering he is on psychotropic medications. -Risks and benefits, side effects and alternatives discussed with patient, he was given an opportunity to ask questions about his medication, illness, and treatment. All current psychiatric medications have been reviewed and discussed with the patient and adjusted as clinically appropriate. The patient has been provided an accurate and updated list of the medications being now prescribed.   Routine PRN Medications:  Negative  Consultations: The patient was encouraged to keep all PCP and specialty clinic appointments.   Safety Concerns:   Patient told to call clinic if any problems occur. Patient advised to go to  ER  if he should develop SI/HI, side effects, or if symptoms worsen. Has crisis  numbers to call if needed.    Other:   I will write down for Fasting lipid profile and basic metabolic labs. He understands the risk of medications and side effects . None as of now. 8. Patient was instructed to return to clinic in 3 months.  9. The patient was advised to call and cancel their mental health appointment within 24 hours of the appointment, if they are unable to keep the appointment, as well as the three no show and termination from clinic policy. 10. The patient expressed understanding of the plan and agrees with the above. 11 Advised patient I would no longer be at this practice after December 24, 2013.  Time Spent: 25 minutes   05/08/2014 11:45 AM

## 2014-08-03 ENCOUNTER — Encounter (INDEPENDENT_AMBULATORY_CARE_PROVIDER_SITE_OTHER): Payer: Self-pay

## 2014-08-03 ENCOUNTER — Encounter (HOSPITAL_COMMUNITY): Payer: Self-pay | Admitting: Psychiatry

## 2014-08-03 ENCOUNTER — Ambulatory Visit (INDEPENDENT_AMBULATORY_CARE_PROVIDER_SITE_OTHER): Payer: Self-pay | Admitting: Psychiatry

## 2014-08-03 VITALS — BP 139/86 | HR 96 | Ht 71.0 in | Wt 210.0 lb

## 2014-08-03 DIAGNOSIS — F258 Other schizoaffective disorders: Principal | ICD-10-CM

## 2014-08-03 DIAGNOSIS — F259 Schizoaffective disorder, unspecified: Secondary | ICD-10-CM

## 2014-08-03 DIAGNOSIS — F411 Generalized anxiety disorder: Secondary | ICD-10-CM

## 2014-08-03 MED ORDER — ASENAPINE MALEATE 5 MG SL SUBL: SUBLINGUAL_TABLET | SUBLINGUAL | Status: DC

## 2014-08-03 MED ORDER — FLUOXETINE HCL 20 MG PO CAPS: 60.0000 mg | ORAL_CAPSULE | Freq: Every day | ORAL | Status: DC

## 2014-08-03 NOTE — Progress Notes (Signed)
Patient ID: Ronald [name], male   DOB: 1965/11/10, 48 y.o.   MRN: [id number]   [facility] Follow-up Outpatient Visit  Ronald [name] 06/30/1966 [id number] 48 y.o. 08/03/2014 1:08 PM  Chief Complaint:  HPI Comments: Ronald [name] is  a 48 y/o male with a past psychiatric history significant for Schizoaffective Disorder. The patient is referred for psychiatric services for medication management.  Patient started having paranoid delusions in 2009. He was feeling [name] the [place] along with aliens are out against his country. He he started getting help into trouble His medications in the beginning with Risperdal  did not help and then it was changed to [place].  He is seldom taking trazodone for sleep anymore.  Last visit he wanted to cut down his Saphris and he cut down to 75 mg daily the day and continue 10 mg at night. He has not noticed any change. His mom is mention that he gets a little bit agitated but he does not recall that. Michela Pitcher that it is sometimes difficult to deal with his mom especially when a sister comes visits.  He is working part-time does not endorse any ongoing paranoia or significant depressive symptoms. He is looking forward to changing his part-time job to full-time job. There is no recurrence of paranoia or delusions. Aims or involuntary movements was negative  . Location: The patient is doing better regarding paranoia and delusions. Less depressed and no suicidal thoughts. . Quality:  The patient is still concerned about the process of applying for medicaid. He also reports concerns about getting his medications but is getting medications through [facility]. He reports he is taking his medications and denies any side effects.  In the area of affective symptoms, patient appears anxious and not depressed. Patient denies current suicidal ideation, intent, or plan. Patient denies current homicidal ideation, intent, or plan. Patient denies auditory hallucinations.  Patient denies visual hallucinations. Patient denies symptoms of paranoia, but endorses paranoia in the past about people "get him" he is not really sure why.   . Severity: Mild to moderate  . Duration: Patient has improved over the past month.  . Timing: No specific timimng  . Context: Finances and insurance  Collateral information from his mother reveal that the patient has been doing well. She would like them to meet twice daily to plans their day.     Headache: Negative Seizure: Negative Paresthesias: Negative  Past Medical Family, Social History:  Past Medical History  Diagnosis Date  . Cancer     Skin  . Squamous cell skin cancer, ala nasi    Family History  Problem Relation Age of Onset  . Diabetes Mellitus II Brother   . Schizophrenia Maternal Uncle   . Alcohol abuse Neg Hx   . Anxiety disorder Neg Hx   . Bipolar disorder Neg Hx   . Dementia Neg Hx   . Depression Neg Hx   . Drug abuse Neg Hx   . Coronary artery disease Neg Hx    History   Social History  . Marital Status: Single    Spouse Name: N/A    Number of Children: N/A  . Years of Education: N/A   Social History Main Topics  . Smoking status: Former Smoker -- 1.00 packs/day    Types: Cigarettes    Quit date: 10/30/2008  . Smokeless tobacco: Never Used     Comment: ni  . Alcohol Use: No  . Drug Use: No  . Sexual Activity: No  Other Topics Concern  . None   Social History Narrative    Outpatient Encounter Prescriptions as of 08/03/2014  Medication Sig  . asenapine (SAPHRIS) 5 MG SUBL 24 hr tablet One tablet during the day. 2 at night.  Marland Kitchen FLUoxetine (PROZAC) 20 MG capsule Take 3 capsules (60 mg total) by mouth daily.  . [DISCONTINUED] asenapine (SAPHRIS) 5 MG SUBL 24 hr tablet One tablet during the day. 2 at night.  . [DISCONTINUED] FLUoxetine (PROZAC) 20 MG capsule Take 3 capsules (60 mg total) by mouth daily.  . traZODone (DESYREL) 100 MG tablet Take 1.5 tablets (150 mg total) by mouth  at bedtime.     Review of Systems  Constitutional: Negative for weight loss and malaise/fatigue.  HENT: Tinnitus: Left ear and sometime in his right ear.   Eyes: Negative for blurred vision.  Respiratory: Negative for cough.   Cardiovascular: Negative for chest pain and leg swelling.  Gastrointestinal: Negative for nausea.  Musculoskeletal: Negative for myalgias.  Skin: Negative for itching and rash.  Neurological: Negative for tremors and headaches.  Psychiatric/Behavioral: Negative for depression, suicidal ideas, hallucinations and substance abuse. The patient is nervous/anxious.    Filed Vitals:   08/03/14 1255  BP: 139/86  Pulse: 96  Height: 5\' 11"  (1.803 m)  Weight: 210 lb (95.255 kg)    Physical Exam: Constitutional: General Appearance: alert, oriented, no acute distress and well nourished Musculoskeletal: Gait & Station: normal Patient leans: N/A  Psychiatric Specialty Exam: General Appearance: Casual and Well Groomed  Engineer, water::  Fair  Speech:  Clear and Coherent and Normal Rate  Volume:  Normal  Mood:  "better"  Affect:  Appropriate, Congruent and Full Range  Thought Process:  Coherent, Linear and Logical  Orientation:  Full (Time, Place, and Person)  Thought Content:  WDL  Suicidal Thoughts:  No  Homicidal Thoughts:  No  Memory:  Immediate;   Good Recent;   Good Remote;   Good  Judgement:  Fair  Insight:  Fair  Psychomotor Activity:  Normal  Concentration:  Good  Recall:  Negative  Akathisia:  Negative  Language-Intact  Fund of Knowledge-Average  Handed:  Right  AIMS (if indicated):   As noted in chart.  Assets:  Communication Skills Desire for Improvement Financial Resources/Insurance Intimacy Leisure Time Physical Health Resilience Social Support Talents/Skills   Medical Decision Making (Choose Three): Review of Psycho-Social Stressors (1), Order AIMS Test (2), Established Problem, Worsening (2) and Review of Medication Regimen & Side  Effects (2)  Assessment: Schizoaffective Disorder-improving  Axis I: Schizoaffective Disorder.  Plan:  Plan of Care:  PLAN:  1. Affirm with the patient that the medications are taken as ordered. Patient  expressed understanding of how their medications were to be used.    Laboratory:  No labs warranted at this time.   Psychotherapy: Therapy: brief supportive therapy provided.  Discussed psychosocial stressors in detail.  More than 50% of the visit was spent on individual therapy/counseling.   Medications:  Continue the following psychiatric medications as written prior to this appointment with the following changes::  continue Saphris 5 mg sublingual during the day and 10 mg at night. Continue Prozac.Hold  Off trazodone discussed sleep hygiene.  Advised at last but he states that he cannot afford it. He understands the risk of having high cholesterol or elevated blood glucose considering he is on psychotropic medications. -Risks and benefits, side effects and alternatives discussed with patient, he was given an opportunity to ask questions about his medication,  illness, and treatment. All current psychiatric medications have been reviewed and discussed with the patient and adjusted as clinically appropriate. The patient has been provided an accurate and updated list of the medications being now prescribed.   Routine PRN Medications:  Negative  Consultations: The patient was encouraged to keep all PCP and specialty clinic appointments.   Safety Concerns:   Patient told to call clinic if any problems occur. Patient advised to go to  ER  if he should develop SI/HI, side effects, or if symptoms worsen. Has crisis numbers to call if needed.    Other:   I will write down for Fasting lipid profile and basic metabolic labs. He understands the risk of medications and side effects . None as of now. 8. Patient was instructed to return to clinic in 3 months.  9. The patient was advised to call  and cancel their mental health appointment within 24 hours of the appointment, if they are unable to keep the appointment, as well as the three no show and termination from clinic policy. 10. The patient expressed understanding of the plan and agrees with the above. 11 Advised patient I would no longer be at this practice after December 24, 2013.  Time Spent: 25 minutes   08/03/2014 1:08 PM

## 2014-08-04 ENCOUNTER — Ambulatory Visit (HOSPITAL_COMMUNITY): Payer: Self-pay | Admitting: Psychiatry

## 2014-10-05 ENCOUNTER — Ambulatory Visit (INDEPENDENT_AMBULATORY_CARE_PROVIDER_SITE_OTHER): Payer: BLUE CROSS/BLUE SHIELD | Admitting: Psychiatry

## 2014-10-05 ENCOUNTER — Encounter (INDEPENDENT_AMBULATORY_CARE_PROVIDER_SITE_OTHER): Payer: Self-pay

## 2014-10-05 ENCOUNTER — Encounter (HOSPITAL_COMMUNITY): Payer: Self-pay | Admitting: Psychiatry

## 2014-10-05 VITALS — BP 137/91 | HR 103 | Ht 71.0 in | Wt 207.0 lb

## 2014-10-05 DIAGNOSIS — F259 Schizoaffective disorder, unspecified: Secondary | ICD-10-CM

## 2014-10-05 DIAGNOSIS — F411 Generalized anxiety disorder: Secondary | ICD-10-CM

## 2014-10-05 DIAGNOSIS — F258 Other schizoaffective disorders: Principal | ICD-10-CM

## 2014-10-05 MED ORDER — FLUOXETINE HCL 20 MG PO CAPS: 60.0000 mg | ORAL_CAPSULE | Freq: Every day | ORAL | Status: DC

## 2014-10-05 MED ORDER — ASENAPINE MALEATE 5 MG SL SUBL: SUBLINGUAL_TABLET | SUBLINGUAL | Status: DC

## 2014-10-05 NOTE — Progress Notes (Signed)
Patient ID: Ronald [name], male   DOB: 09-11-1966, 49 y.o.   MRN: [id number]   [facility] Follow-up Outpatient Visit  SLOANE JUNKIN [birth year] [id number] 49 y.o. 10/05/2014 2:41 PM  Chief Complaint:  HPI Comments: Ronald [name] is  a 49 y/o male with a past psychiatric history significant for Schizoaffective Disorder. The patient is referred for psychiatric services for medication management.  Patient started having paranoid delusions in 2009. He was feeling [name] the [place] along with aliens are out against his country. He he started getting help into trouble His medications in the beginning with Risperdal  did not help and then it was changed to Angelica.  He is seldom taking trazodone for sleep anymore. Discontinued now.   Last visit he wanted to cut down his Saphris and he cut down to 75 mg daily the day and continue 10 mg at night. He has not noticed any change. Michela Pitcher that it is sometimes difficult to deal with his mom especially sister comes visits.  He is working full time and denies  depressive symptoms. . There is no recurrence of paranoia or delusions. Aims or involuntary movements was negative  . Location: The patient is doing better regarding paranoia and delusions. Less depressed and no suicidal thoughts. . Quality:  The patient is still concerned about the process of applying for medicaid. He also reports concerns about getting his medications but is getting medications through [facility]. He reports he is taking his medications and denies any side effects.  In the area of affective symptoms, patient appears anxious and not depressed. Patient denies current suicidal ideation, intent, or plan. Patient denies current homicidal ideation, intent, or plan. Patient denies auditory hallucinations. Patient denies visual hallucinations. Patient denies symptoms of paranoia, but endorses paranoia in the past about people "get him" he is not really sure why.   .  Severity: Mild to moderate  . Duration: Patient has improved over the past month.  . Timing: No specific timimng  . Context: Finances and insurance  Collateral information from his mother reveal that the patient has been doing well. She would like them to meet twice daily to plans their day.     Headache: Negative Seizure: Negative Paresthesias: Negative  Past Medical Family, Social History:  Past Medical History  Diagnosis Date  . Cancer     Skin  . Squamous cell skin cancer, ala nasi    Family History  Problem Relation Age of Onset  . Diabetes Mellitus II Brother   . Schizophrenia Maternal Uncle   . Alcohol abuse Neg Hx   . Anxiety disorder Neg Hx   . Bipolar disorder Neg Hx   . Dementia Neg Hx   . Depression Neg Hx   . Drug abuse Neg Hx   . Coronary artery disease Neg Hx    History   Social History  . Marital Status: Single    Spouse Name: N/A    Number of Children: N/A  . Years of Education: N/A   Social History Main Topics  . Smoking status: Former Smoker -- 1.00 packs/day    Types: Cigarettes    Quit date: 10/30/2008  . Smokeless tobacco: Never Used     Comment: ni  . Alcohol Use: No  . Drug Use: No  . Sexual Activity: No   Other Topics Concern  . None   Social History Narrative    Outpatient Encounter Prescriptions as of 10/05/2014  Medication Sig  . asenapine (SAPHRIS) 5  MG SUBL 24 hr tablet One tablet during the day. 2 at night.  Marland Kitchen FLUoxetine (PROZAC) 20 MG capsule Take 3 capsules (60 mg total) by mouth daily.  . [DISCONTINUED] asenapine (SAPHRIS) 5 MG SUBL 24 hr tablet One tablet during the day. 2 at night.  . [DISCONTINUED] FLUoxetine (PROZAC) 20 MG capsule Take 3 capsules (60 mg total) by mouth daily.  . [DISCONTINUED] traZODone (DESYREL) 100 MG tablet Take 1.5 tablets (150 mg total) by mouth at bedtime.     Review of Systems  Constitutional: Negative.   Cardiovascular: Negative for chest pain.  Neurological: Negative for tremors.   Psychiatric/Behavioral: Negative for depression. The patient is not nervous/anxious.    Filed Vitals:   10/05/14 1427  BP: 137/91  Pulse: 103  Height: 5\' 11"  (1.803 m)  Weight: 207 lb (93.895 kg)    Physical Exam: Constitutional: General Appearance: alert, oriented, no acute distress and well nourished Musculoskeletal: Gait & Station: normal Patient leans: N/A  Psychiatric Specialty Exam: General Appearance: Casual and Well Groomed  Engineer, water::  Fair  Speech:  Clear and Coherent and Normal Rate  Volume:  Normal  Mood:  "better"  Affect:  Appropriate, Congruent and Full Range  Thought Process:  Coherent, Linear and Logical  Orientation:  Full (Time, Place, and Person)  Thought Content:  WDL  Suicidal Thoughts:  No  Homicidal Thoughts:  No  Memory:  Immediate;   Good Recent;   Good Remote;   Good  Judgement:  Fair  Insight:  Fair  Psychomotor Activity:  Normal  Concentration:  Good  Recall:  Negative  Akathisia:  Negative  Language-Intact  Fund of Knowledge-Average  Handed:  Right  AIMS (if indicated):   As noted in chart.  Assets:  Communication Skills Desire for Improvement Financial Resources/Insurance Intimacy Leisure Time Physical Health Resilience Social Support Talents/Skills   Medical Decision Making (Choose Three): Review of Psycho-Social Stressors (1), Order AIMS Test (2), Established Problem, Worsening (2) and Review of Medication Regimen & Side Effects (2)  Assessment: Schizoaffective Disorder-improving  Axis I: Schizoaffective Disorder.  Plan:  Plan of Care:  PLAN:  1. Affirm with the patient that the medications are taken as ordered. Patient  expressed understanding of how their medications were to be used.    Laboratory:  No labs warranted at this time.   Psychotherapy: Therapy: brief supportive therapy provided.  Discussed psychosocial stressors in detail.  More than 50% of the visit was spent on individual therapy/counseling.    Medications:  Continue the following psychiatric medications as written prior to this appointment with the following changes::  continue Saphris 5 mg sublingual during the day and 10 mg at night. Continue Prozac 60mg  per day.  Advised at last but he states that he cannot afford it. He understands the risk of having high cholesterol or elevated blood glucose considering he is on psychotropic medications. -Risks and benefits, side effects and alternatives discussed with patient, he was given an opportunity to ask questions about his medication, illness, and treatment. All current psychiatric medications have been reviewed and discussed with the patient and adjusted as clinically appropriate. The patient has been provided an accurate and updated list of the medications being now prescribed.   Routine PRN Medications:  Negative  Consultations: The patient was encouraged to keep all PCP and specialty clinic appointments.   Safety Concerns:   Patient told to call clinic if any problems occur. Patient advised to go to  ER  if he should develop SI/HI,  side effects, or if symptoms worsen. Has crisis numbers to call if needed.    Other:   I will write down for Fasting lipid profile and basic metabolic labs. He understands the risk of medications and side effects . None as of now. 8. Patient was instructed to return to clinic in 3 months.  9. The patient was advised to call and cancel their mental health appointment within 24 hours of the appointment, if they are unable to keep the appointment, as well as the three no show and termination from clinic policy. 10. The patient expressed understanding of the plan and agrees with the above.      10/05/2014 2:41 PM

## 2014-11-17 ENCOUNTER — Telehealth (HOSPITAL_COMMUNITY): Payer: Self-pay | Admitting: *Deleted

## 2014-11-17 NOTE — Telephone Encounter (Signed)
Pt called for a prior authorization for asenapine (SAPHRIS) 5 MG SUBL 24 hr tablet.  Pt was given [phone] to call for PA. Pharmacy: [facility]  Pt id #: [id number]  Please call pharmacy once medication is approved.

## 2014-11-20 MED ORDER — FLUOXETINE HCL 20 MG PO CAPS: 60.0000 mg | ORAL_CAPSULE | Freq: Every day | ORAL | Status: DC

## 2014-11-20 NOTE — Telephone Encounter (Signed)
PT needs a script for FLUoxetine (PROZAC) 20 MG capsule  sent to [facility] in [place]

## 2014-11-20 NOTE — Telephone Encounter (Signed)
prozac sent to [facility] as per requested .

## 2014-11-24 ENCOUNTER — Telehealth (HOSPITAL_COMMUNITY): Payer: Self-pay | Admitting: *Deleted

## 2014-11-24 NOTE — Telephone Encounter (Signed)
Prior authorization received for Saphris 5mg , called [phone] spoke with Vira Agar who gave approval from 11/03/14-09/10/98. Case ID #[id number]. Pt will be notified by letter per Vira Agar.

## 2014-11-30 ENCOUNTER — Telehealth (HOSPITAL_COMMUNITY): Payer: Self-pay

## 2014-11-30 DIAGNOSIS — F259 Schizoaffective disorder, unspecified: Secondary | ICD-10-CM

## 2014-11-30 DIAGNOSIS — F258 Other schizoaffective disorders: Principal | ICD-10-CM

## 2014-11-30 DIAGNOSIS — F411 Generalized anxiety disorder: Secondary | ICD-10-CM

## 2014-11-30 NOTE — Telephone Encounter (Signed)
asenapine (SAPHRIS) 5 MG SUBL 24 hr tablet  Please call in [facility] [address] [phone]

## 2014-12-01 ENCOUNTER — Telehealth (HOSPITAL_COMMUNITY): Payer: Self-pay | Admitting: *Deleted

## 2014-12-01 DIAGNOSIS — F259 Schizoaffective disorder, unspecified: Secondary | ICD-10-CM

## 2014-12-01 DIAGNOSIS — F411 Generalized anxiety disorder: Secondary | ICD-10-CM

## 2014-12-01 DIAGNOSIS — F258 Other schizoaffective disorders: Principal | ICD-10-CM

## 2014-12-01 MED ORDER — ASENAPINE MALEATE 5 MG SL SUBL: SUBLINGUAL_TABLET | SUBLINGUAL | Status: DC

## 2014-12-01 NOTE — Telephone Encounter (Signed)
Pt called for a refill for Saphris 5mg . Per Dr. De Nurse, pt is authorized for a refill for Saphris 5mg , Qty 90. Pres[facility] on [address] [facility]) Called and informed pt of prescription status.  Pt has f/u appt on 4/26. Pt verbally states and shows understanding.

## 2014-12-01 NOTE — Telephone Encounter (Signed)
   Pt called for a refill for Saphris 5mg . Per Dr. De Nurse, Pt is authorized for a refill for Saphris 5mg , qty 90. [facility] on [address] [facility])  Called and informed pt of prescription status. Pt has f/u appt on 4/26. Pt verbally states and shows understanding.

## 2014-12-22 MED ORDER — FLUOXETINE HCL 20 MG PO CAPS: 60.0000 mg | ORAL_CAPSULE | Freq: Every day | ORAL | Status: DC

## 2014-12-22 NOTE — Telephone Encounter (Signed)
FLUoxetine (PROZAC) 20 MG capsule  PT needs this called in to [facility] [place], [place] [phone] (Phone) [phone] (Fax)

## 2014-12-22 NOTE — Telephone Encounter (Signed)
prozac sent to pharmacy for refill request.

## 2014-12-27 ENCOUNTER — Other Ambulatory Visit (HOSPITAL_COMMUNITY): Payer: Self-pay | Admitting: Psychiatry

## 2014-12-28 ENCOUNTER — Telehealth (HOSPITAL_COMMUNITY): Payer: Self-pay | Admitting: *Deleted

## 2014-12-28 DIAGNOSIS — F411 Generalized anxiety disorder: Secondary | ICD-10-CM

## 2014-12-28 DIAGNOSIS — F259 Schizoaffective disorder, unspecified: Secondary | ICD-10-CM

## 2014-12-28 DIAGNOSIS — F258 Other schizoaffective disorders: Principal | ICD-10-CM

## 2014-12-28 MED ORDER — ASENAPINE MALEATE 5 MG SL SUBL: SUBLINGUAL_TABLET | SUBLINGUAL | Status: DC

## 2014-12-28 NOTE — Telephone Encounter (Signed)
Pt called for a refill for asenapine (SAPHRIS) 5 MG SUBL 24 hr tablet. Per Dr. Adonis Housekeeper, pt is authorized for a refill for asenapine (SAPHRIS) 5 MG SUBL 24 hr tablet, Qty 90. Prescription was sent to pharmacy. Pt has a f/u appt on 4/26. Pt states and shows understanding.

## 2014-12-29 ENCOUNTER — Other Ambulatory Visit (HOSPITAL_COMMUNITY): Payer: Self-pay | Admitting: Psychiatry

## 2014-12-29 DIAGNOSIS — F3130 Bipolar disorder, current episode depressed, mild or moderate severity, unspecified: Secondary | ICD-10-CM

## 2015-01-01 LAB — CBC
HCT: 45.9 % (ref 39.0–52.0)
Hemoglobin: 15.7 g/dL (ref 13.0–17.0)
MCH: 29.5 pg (ref 26.0–34.0)
MCHC: 34.2 g/dL (ref 30.0–36.0)
MCV: 86.3 fL (ref 78.0–100.0)
MPV: 8.8 fL (ref 8.6–12.4)
Platelets: 199 10*3/uL (ref 150–400)
RBC: 5.32 MIL/uL (ref 4.22–5.81)
RDW: 13.9 % (ref 11.5–15.5)
WBC: 8.1 10*3/uL (ref 4.0–10.5)

## 2015-01-01 LAB — LIPID PANEL W/DIRECT LDL/HDL RATIO
CHOL/HDL RATIO: 5.6 ratio
CHOLESTEROL - DIR LDL/HDL RATIO: 209 mg/dL — AB (ref 0–200)
Direct LDL: 141 mg/dL — ABNORMAL HIGH
HDL: 37 mg/dL — AB (ref >=40)
LDL:HDL Ratio: 3.8 Ratio
Triglycerides: 170 mg/dL — ABNORMAL HIGH (ref <150)

## 2015-01-01 LAB — HEMOGLOBIN A1C
HEMOGLOBIN A1C: 5.6 % (ref <5.7)
Mean Plasma Glucose: 114 mg/dL (ref <117)

## 2015-01-05 ENCOUNTER — Ambulatory Visit (INDEPENDENT_AMBULATORY_CARE_PROVIDER_SITE_OTHER): Payer: BLUE CROSS/BLUE SHIELD | Admitting: Psychiatry

## 2015-01-05 ENCOUNTER — Encounter (HOSPITAL_COMMUNITY): Payer: Self-pay | Admitting: Psychiatry

## 2015-01-05 VITALS — BP 139/87 | HR 84 | Ht 71.0 in | Wt 211.0 lb

## 2015-01-05 DIAGNOSIS — F411 Generalized anxiety disorder: Secondary | ICD-10-CM

## 2015-01-05 DIAGNOSIS — F258 Other schizoaffective disorders: Secondary | ICD-10-CM

## 2015-01-05 DIAGNOSIS — F259 Schizoaffective disorder, unspecified: Secondary | ICD-10-CM

## 2015-01-05 NOTE — Progress Notes (Signed)
Patient ID: Ronald [name], male   DOB: January 04, 1966, 49 y.o.   MRN: [id number]   [name] Follow-up Outpatient Visit  Ronald [name] Dec 29, 1965 [id number] 49 y.o. 01/05/2015 3:12 PM  Chief Complaint:  HPI Comments: Ronald [name] is  a 49 y/o male with a past psychiatric history significant for Schizoaffective Disorder and Generalized anxiety disorder. The patient is referred for psychiatric services for medication management.  Patient started having paranoid delusions in 2009. He was feeling as the Ronald [name] are along with aliens and out against his country.  His medications in the beginning with Risperdal  did not help and then it was changed to [place].  He continues to do reasonable with no recurrence of any significant delusion. He is taking Saphris 5 mg one in the morning and 2 at night.  He is working full time and denies  depressive symptoms. Aims or involuntary movements was negative  . Location: The patient is doing better regarding paranoia and delusions. Less depressed and no suicidal thoughts. . Quality:  Denies ongoing hallucinations, hopelessness or feeling confused or delusional. . Severity: Mild   . Duration: Patient has improved over the past few months  . Timing: No specific timimng  . Context: Finances and insurance can cause worries at times excessive  Labs reviewed slightly elevated Cholesterol. Patient will make appointment with primary care    Headache: Negative Seizure: Negative Paresthesias: Negative  Past Medical Family, Social History:  Past Medical History  Diagnosis Date  . Cancer     Skin  . Squamous cell skin cancer, ala nasi    Family History  Problem Relation Age of Onset  . Diabetes Mellitus II Brother   . Schizophrenia Maternal Uncle   . Alcohol abuse Neg Hx   . Anxiety disorder Neg Hx   . Bipolar disorder Neg Hx   . Dementia Neg Hx   . Depression Neg Hx   . Drug abuse Neg Hx   . Coronary artery disease Neg Hx     History   Social History  . Marital Status: Single    Spouse Name: N/A  . Number of Children: N/A  . Years of Education: N/A   Social History Main Topics  . Smoking status: Former Smoker -- 1.00 packs/day    Types: Cigarettes    Quit date: 10/30/2008  . Smokeless tobacco: Never Used     Comment: ni  . Alcohol Use: No  . Drug Use: No  . Sexual Activity: No   Other Topics Concern  . None   Social History Narrative    Outpatient Encounter Prescriptions as of 01/05/2015  Medication Sig  . asenapine (SAPHRIS) 5 MG SUBL 24 hr tablet One tablet during the day. 2 at night.  Marland Kitchen FLUoxetine (PROZAC) 20 MG capsule Take 3 capsules (60 mg total) by mouth daily.     Review of Systems  Constitutional: Negative.   Skin: Negative for rash.  Neurological: Negative for tremors.  Psychiatric/Behavioral: Negative for depression, hallucinations and substance abuse.   Filed Vitals:   01/05/15 1458  BP: 139/87  Pulse: 84  Height: 5\' 11"  (1.803 m)  Weight: 211 lb (95.709 kg)    Physical Exam: Constitutional: General Appearance: alert, oriented, no acute distress and well nourished Musculoskeletal: Gait & Station: normal Patient leans: N/A  Psychiatric Specialty Exam: General Appearance: Casual and Well Groomed  Engineer, water::  Fair  Speech:  Clear and Coherent and Normal Rate  Volume:  Normal  Mood:  Euthymic to  mildly guarded  Affect:  Appropriate, Congruent and Full Range  Thought Process:  Coherent, Linear and Logical  Orientation:  Full (Time, Place, and Person)  Thought Content:  WDL  Suicidal Thoughts:  No  Homicidal Thoughts:  No  Memory:  Immediate;   Good Recent;   Good Remote;   Good  Judgement:  Fair  Insight:  Fair  Psychomotor Activity:  Normal  Concentration:  Good  Recall:  Negative  Akathisia:  Negative  Murray:  Right  AIMS (if indicated):   As noted in chart.  Assets:  Communication Skills Desire for  Improvement Financial Resources/Insurance Intimacy Leisure Time Physical Health Resilience Social Support Talents/Skills   Medical Decision Making (Choose Three): Review of Psycho-Social Stressors (1), Order AIMS Test (2), Established Problem, Worsening (2) and Review of Medication Regimen & Side Effects (2)  Assessment: Schizoaffective Disorder  Axis I: Schizoaffective Disorder. GAD  Plan:  Plan of Care:  PLAN:  1. Affirm with the patient that the medications are taken as ordered. Patient  expressed understanding of how their medications were to be used.    Laboratory:  No labs warranted at this time.   Psychotherapy: Therapy: brief supportive therapy provided.  Discussed psychosocial stressors in detail.  More than 50% of the visit was spent on individual therapy/counseling.   Medications:  Continue the following psychiatric medications as written prior to this appointment with the following changes::  continue Saphris 5 mg sublingual during the day and 10 mg at night. Continue Prozac 60mg  per day. Patient will call for refills again.  Advised at last but he states that he cannot afford it. He understands the risk of having high cholesterol or elevated blood glucose considering he is on psychotropic medications. -Risks and benefits, side effects and alternatives discussed with patient, he was given an opportunity to ask questions about his medication, illness, and treatment. All current psychiatric medications have been reviewed and discussed with the patient and adjusted as clinically appropriate. The patient has been provided an accurate and updated list of the medications being now prescribed.   Routine PRN Medications:  Negative  Consultations: The patient was encouraged to keep all PCP and specialty clinic appointments.   Safety Concerns:   Patient told to call clinic if any problems occur. Patient advised to go to  ER  if he should develop SI/HI, side effects, or if  symptoms worsen. Has crisis numbers to call if needed.    Other:    Refer to primary care to review labs. 8. Patient was instructed to return to clinic in 2 months.  9. The patient was advised to call and cancel their mental health appointment within 24 hours of the appointment, if they are unable to keep the appointment, as well as the three no show and termination from clinic policy. 10. The patient expressed understanding of the plan and agrees with the above.      01/05/2015 3:12 PM

## 2015-01-20 ENCOUNTER — Encounter: Payer: Self-pay | Admitting: Family Medicine

## 2015-01-20 DIAGNOSIS — E785 Hyperlipidemia, unspecified: Secondary | ICD-10-CM

## 2015-01-20 HISTORY — DX: Hyperlipidemia, unspecified: E78.5

## 2015-01-21 ENCOUNTER — Ambulatory Visit (INDEPENDENT_AMBULATORY_CARE_PROVIDER_SITE_OTHER): Payer: BLUE CROSS/BLUE SHIELD | Admitting: Family Medicine

## 2015-01-21 ENCOUNTER — Encounter: Payer: Self-pay | Admitting: Family Medicine

## 2015-01-21 VITALS — BP 122/80 | HR 89 | Ht 71.0 in | Wt 210.0 lb

## 2015-01-21 DIAGNOSIS — E785 Hyperlipidemia, unspecified: Secondary | ICD-10-CM

## 2015-01-21 DIAGNOSIS — R7309 Other abnormal glucose: Secondary | ICD-10-CM | POA: Diagnosis not present

## 2015-01-21 NOTE — Progress Notes (Signed)
   Subjective:    Patient ID: Ronald [name], male    DOB: 07/01/1966, 49 y.o.   MRN: [id number]  HPI here to follow-up on abnormal cholesterol results. His psychiatrist check some blood work and they added a cholesterol level. His total cluster was 209 and direct LDL was 141. Trig list rides were mildly elevated at 170. He also had a white blood cell count which was normal and a hemoglobin A1c which was borderline at 5.6. He does get some exercise on the weekends. He eats more beef and he does chicken. He says he mostly avoids fried foods. Drinking 2% mild. Used to drink skim.  Says not family history of heart dz.  + fam hx of DM.    Review of Systems     Objective:   Physical Exam  Constitutional: He is oriented to person, place, and time. He appears well-developed and well-nourished.  HENT:  Head: Normocephalic and atraumatic.  Cardiovascular: Normal rate, regular rhythm and normal heart sounds.   Pulmonary/Chest: Effort normal and breath sounds normal.  Neurological: He is alert and oriented to person, place, and time.  Skin: Skin is warm and dry.  Psychiatric: He has a normal mood and affect. His behavior is normal.          Assessment & Plan:  Hyper lipidemia-calculated cardiovascular risk. 10 year risk is 6.7%. Current guidelines recommend treatment with medication if risk is greater than 7.5%. We discussed really focusing in on diet exercise and weight loss. Blood pressures well controlled and he is not a smoker which is great. Also no family history of heart disease. We discussed small changes such as switching to more lean meats and soda beef, switching to skim milk instead of 2%, and watching portion sizes on carbohydrates and sweetened beverages and foods. Given handout with some additional information. Also recommended about a 15 pound weight loss. If he can lose about 30 pounds he would get down to a BMI 25 which is normal. Follow-up in 3 months to recheck lipids.  Next  Elevated glucose - A1c which is 5.6 was a little bit borderline. I just encouraged him to watch his sweets intake and cut back on carbohydrate portion sizes and recheck in one year to make sure that he is not starting to drift towards prediabetes.

## 2015-01-21 NOTE — Patient Instructions (Signed)

## 2015-01-28 ENCOUNTER — Telehealth (HOSPITAL_COMMUNITY): Payer: Self-pay | Admitting: *Deleted

## 2015-01-28 DIAGNOSIS — F258 Other schizoaffective disorders: Principal | ICD-10-CM

## 2015-01-28 DIAGNOSIS — F259 Schizoaffective disorder, unspecified: Secondary | ICD-10-CM

## 2015-01-28 DIAGNOSIS — F411 Generalized anxiety disorder: Secondary | ICD-10-CM

## 2015-01-28 MED ORDER — FLUOXETINE HCL 20 MG PO CAPS: 60.0000 mg | ORAL_CAPSULE | Freq: Every day | ORAL | Status: DC

## 2015-01-28 MED ORDER — ASENAPINE MALEATE 5 MG SL SUBL
SUBLINGUAL_TABLET | SUBLINGUAL | Status: DC
Start: 2015-01-28 — End: 2015-03-02

## 2015-01-28 NOTE — Telephone Encounter (Signed)
Pt called for a refill for Saphris 5mg . Per Dr. De Nurse, pt is authorized for a refill for Saphris 5mg , qty 90. Pres[facility] ([place] Pt has a f/u appt on 7/26. Called and informed pt of prescription status. Pt states and shows understanding.

## 2015-01-28 NOTE — Telephone Encounter (Signed)
Pt called for a refill for Prozac 20mg . Per Dr. De Nurse, pt is authorized for a refill for Prozac 20mg , qty 90. Prescription was sent to [facility] ([place]) Pt has a f/u appt on 7/26. Called and informed pt of prescription status. Pt states and shows understanding.

## 2015-03-02 ENCOUNTER — Telehealth (HOSPITAL_COMMUNITY): Payer: Self-pay | Admitting: *Deleted

## 2015-03-02 DIAGNOSIS — F258 Other schizoaffective disorders: Principal | ICD-10-CM

## 2015-03-02 DIAGNOSIS — F411 Generalized anxiety disorder: Secondary | ICD-10-CM

## 2015-03-02 DIAGNOSIS — F259 Schizoaffective disorder, unspecified: Secondary | ICD-10-CM

## 2015-03-02 MED ORDER — FLUOXETINE HCL 20 MG PO CAPS: 60.0000 mg | ORAL_CAPSULE | Freq: Every day | ORAL | Status: DC

## 2015-03-02 MED ORDER — ASENAPINE MALEATE 5 MG SL SUBL: SUBLINGUAL_TABLET | SUBLINGUAL | Status: DC

## 2015-03-02 NOTE — Telephone Encounter (Signed)
Pt called for a refill for Saphris 5mg  and Prozac 20mg . Per Dr. De Nurse, pt is authorized for a refill for Saphris 5mg , Qty 90 and Prozac 20mg , Qty 90. Prescriptions [facility] to [facility]. Pt has a f/u appt on 7/26. Called and informed pt if prescription status. Pt verbalizes understanding.

## 2015-03-30 ENCOUNTER — Other Ambulatory Visit (HOSPITAL_COMMUNITY): Payer: Self-pay | Admitting: Psychiatry

## 2015-03-30 NOTE — Telephone Encounter (Signed)
PT called for a refill for Prozac 20mg  and Saphris 5mg . Per Dr. De Nurse, pt is authorized for a refill Prozac 20mg , Qty 90 and Saphris 5mg , Qty 90. Prescriptions [facility] to [facility]. PT has a f/u appt on 7/26. Called and informed pt of prescription status. PT states and shows understanding.

## 2015-04-06 ENCOUNTER — Ambulatory Visit (INDEPENDENT_AMBULATORY_CARE_PROVIDER_SITE_OTHER): Payer: BLUE CROSS/BLUE SHIELD | Admitting: Psychiatry

## 2015-04-06 ENCOUNTER — Encounter (HOSPITAL_COMMUNITY): Payer: Self-pay | Admitting: Psychiatry

## 2015-04-06 VITALS — BP 130/80 | HR 86 | Ht 71.0 in | Wt 211.8 lb

## 2015-04-06 DIAGNOSIS — F411 Generalized anxiety disorder: Secondary | ICD-10-CM

## 2015-04-06 DIAGNOSIS — F258 Other schizoaffective disorders: Principal | ICD-10-CM

## 2015-04-06 DIAGNOSIS — F259 Schizoaffective disorder, unspecified: Secondary | ICD-10-CM | POA: Diagnosis not present

## 2015-04-06 MED ORDER — ASENAPINE MALEATE 5 MG SL SUBL: SUBLINGUAL_TABLET | SUBLINGUAL | Status: DC

## 2015-04-06 MED ORDER — FLUOXETINE HCL 20 MG PO CAPS: ORAL_CAPSULE | ORAL | Status: DC

## 2015-04-06 NOTE — Progress Notes (Signed)
Patient ID: AHMIR [name], male   DOB: May 21, 1966, 49 y.o.   MRN: [id number]   [place] Follow-up Outpatient Visit  Ronald [name] [birth year] [id number] 49 y.o. 04/06/2015 4:02 PM  Chief Complaint:  HPI Comments: Ronald [name] is  a 49 y/o male with a past psychiatric history significant for Schizoaffective Disorder and Generalized anxiety disorder. The patient is referred for psychiatric services for medication management.  Patient started having paranoid delusions in 2009. He was feeling as the [place] are along with aliens and out against his country.   His medications in the beginning with Risperdal  did not help and then it was changed to [place].  He continues to do reasonable with no recurrence of any significant delusion. He is taking Saphris 5 mg one in the morning and 2 at night.  He is working full time and denies  depressive symptoms. Aims or involuntary movements was negative  . Location: The patient is doing better regarding paranoia and delusions. Less depressed and no suicidal thoughts. . Quality:  Denies ongoing hallucinations, hopelessness or feeling confused or delusional. . Severity: Mild  In past he has cut down saphris and when it reached 7.5mg  he got depressed and was admitted. He did reasonable till dose of 10mg . Medical/ he has high cholesterol and is working on weight loss and increase activities.   . Duration: Patient has improved over the past few months  . Timing: No specific timimng  . Context: Finances and insurance can cause worries at times excessive  Labs reviewed slightly elevated Cholesterol. Patient will make appointment with primary care    Headache: Negative Seizure: Negative Paresthesias: Negative  Past Medical Family, Social History:  Past Medical History  Diagnosis Date  . Cancer     Skin  . Squamous cell skin cancer, ala nasi    Family History  Problem Relation Age of Onset  . Diabetes Mellitus II Brother    . Schizophrenia Maternal Uncle   . Alcohol abuse Neg Hx   . Anxiety disorder Neg Hx   . Bipolar disorder Neg Hx   . Dementia Neg Hx   . Depression Neg Hx   . Drug abuse Neg Hx   . Coronary artery disease Neg Hx    History   Social History  . Marital Status: Single    Spouse Name: N/A  . Number of Children: N/A  . Years of Education: N/A   Social History Main Topics  . Smoking status: Former Smoker -- 1.00 packs/day    Types: Cigarettes    Quit date: 10/30/2008  . Smokeless tobacco: Never Used     Comment: ni  . Alcohol Use: No  . Drug Use: No  . Sexual Activity: No   Other Topics Concern  . Not on file   Social History Narrative    Outpatient Encounter Prescriptions as of 04/06/2015  Medication Sig  . asenapine (SAPHRIS) 5 MG SUBL 24 hr tablet ONE TABLET DURING THE DAY. 2 AT NIGHT.  Marland Kitchen FLUoxetine (PROZAC) 20 MG capsule TAKE 3 CAPSULES (60 MG TOTAL) BY MOUTH DAILY.  . [DISCONTINUED] FLUoxetine (PROZAC) 20 MG capsule TAKE 3 CAPSULES (60 MG TOTAL) BY MOUTH DAILY.  . [DISCONTINUED] SAPHRIS 5 MG SUBL 24 hr tablet ONE TABLET DURING THE DAY. 2 AT NIGHT.   No facility-administered encounter medications on file as of 04/06/2015.     Review of Systems  Constitutional: Negative for fever and weight loss.  Cardiovascular: Negative for chest pain.  Skin: Negative  for rash.  Neurological: Negative for tremors.  Psychiatric/Behavioral: Negative for depression and hallucinations.   Filed Vitals:   04/06/15 1551  BP: 130/80  Pulse: 86  Height: 5\' 11"  (1.803 m)  Weight: 211 lb 12.8 oz (96.072 kg)  SpO2: 94%    Physical Exam: Constitutional: General Appearance: alert, oriented, no acute distress and well nourished Musculoskeletal: Gait & Station: normal Patient leans: N/A  Psychiatric Specialty Exam: General Appearance: Casual and Well Groomed  Engineer, water::  Fair  Speech:  Clear and Coherent and Normal Rate  Volume:  Normal  Mood:  Euthymic to mildly guarded   Affect:  Appropriate, Congruent and Full Range  Thought Process:  Coherent, Linear and Logical  Orientation:  Full (Time, Place, and Person)  Thought Content:  WDL  Suicidal Thoughts:  No  Homicidal Thoughts:  No  Memory:  Immediate;   Good Recent;   Good Remote;   Good  Judgement:  Fair  Insight:  Fair  Psychomotor Activity:  Normal  Concentration:  Good  Recall:  Negative  Akathisia:  Negative  Language-Intact  Fund of Knowledge-Average  Handed:  Right  AIMS (if indicated):   As noted in chart.  Assets:  Communication Skills Desire for Improvement Financial Resources/Insurance Intimacy Leisure Time Physical Health Resilience Social Support Talents/Skills   Medical Decision Making (Choose Three): Review of Psycho-Social Stressors (1), Order AIMS Test (2), Established Problem, Worsening (2) and Review of Medication Regimen & Side Effects (2)  Assessment: Schizoaffective Disorder  Axis I: Schizoaffective Disorder. GAD  Plan:  Plan of Care:  PLAN:  1. Affirm with the patient that the medications are taken as ordered. Patient  expressed understanding of how their medications were to be used.    Laboratory:  No labs warranted at this time.   Psychotherapy: Therapy: brief supportive therapy provided.  Discussed psychosocial stressors in detail.  More than 50% of the visit was spent on individual therapy/counseling.   Medications:  Continue the following psychiatric medications as written prior to this appointment with the following changes::  continue Saphris 5 mg sublingual during the day and 10 mg at night. Continue Prozac 60mg  per day. Patient will call for refills again.  He understands the risk of having high cholesterol or elevated blood glucose considering he is on psychotropic medications. He follows with [facility].   -Risks and benefits, side effects and alternatives discussed with patient, he was given an opportunity to ask questions about his  medication, illness, and treatment. All current psychiatric medications have been reviewed and discussed with the patient and adjusted as clinically appropriate. The patient has been provided an accurate and updated list of the medications being now prescribed.   Routine PRN Medications:  Negative  Consultations: The patient was encouraged to keep all PCP and specialty clinic appointments.   Safety Concerns:   Patient told to call clinic if any problems occur. Patient advised to go to  ER  if he should develop SI/HI, side effects, or if symptoms worsen. Has crisis numbers to call if needed.    Other:  50% time spent in coordination of care, supportive therapy and also reviewed side effects and medication concerns.  Next visit may cut down dose of saphris 10mg  .  Refer to primary care to review labs. 8. Patient was instructed to return to clinic in 2 months.  9. The patient was advised to call and cancel their mental health appointment within 24 hours of the appointment, if they are unable to keep  the appointment, as well as the three no show and termination from clinic policy. 10. The patient expressed understanding of the plan and agrees with the above.    Time spent: 25 minutes.   04/06/2015 4:02 PM

## 2015-05-01 ENCOUNTER — Other Ambulatory Visit (HOSPITAL_COMMUNITY): Payer: Self-pay | Admitting: Psychiatry

## 2015-05-04 NOTE — Telephone Encounter (Signed)
Received medication request from [facility] for Sapharis 5mg  and Prozac 20mg . PT was seen on 04/06/15 by Dr. De Nurse. Prescription refills were sent to [facility] on 04/06/15 with 2 additional refills. Pt has a f/u appt on 07/07/15.

## 2015-07-06 ENCOUNTER — Encounter (HOSPITAL_COMMUNITY): Payer: Self-pay | Admitting: Psychiatry

## 2015-07-06 ENCOUNTER — Ambulatory Visit (INDEPENDENT_AMBULATORY_CARE_PROVIDER_SITE_OTHER): Payer: BLUE CROSS/BLUE SHIELD | Admitting: Psychiatry

## 2015-07-06 VITALS — BP 128/70 | HR 89 | Ht 71.0 in | Wt 210.0 lb

## 2015-07-06 DIAGNOSIS — F259 Schizoaffective disorder, unspecified: Secondary | ICD-10-CM

## 2015-07-06 DIAGNOSIS — F411 Generalized anxiety disorder: Secondary | ICD-10-CM

## 2015-07-06 DIAGNOSIS — F258 Other schizoaffective disorders: Secondary | ICD-10-CM

## 2015-07-06 MED ORDER — FLUOXETINE HCL 20 MG PO CAPS: ORAL_CAPSULE | ORAL | Status: DC

## 2015-07-06 MED ORDER — ASENAPINE MALEATE 5 MG SL SUBL: SUBLINGUAL_TABLET | SUBLINGUAL | Status: DC

## 2015-07-06 NOTE — Progress Notes (Signed)
Patient ID: Ronald [name], male   DOB: 05-Apr-1966, 49 y.o.   MRN: [id number]   [place] Follow-up Outpatient Visit  HILMAR [name] June 11, 1966 [id number] 49 y.o. 07/06/2015 4:04 PM  Chief Complaint:  HPI Comments: [name] is  a 49 y/o male with a past psychiatric history significant for Schizoaffective Disorder and Generalized anxiety disorder. The patient is referred for psychiatric services for medication management.  Patient started having paranoid delusions in 2009. He was feeling as the [place] are along with aliens and out against his country.   His medications in the beginning with Risperdal  did not help and then it was changed to [place].  He continues to do reasonable with no recurrence of any significant delusion. He is taking Saphris 5 mg one in the morning and 2 at night. Today he is ready to cut down. Says his mom will keep an eye on his symptoms.    He is working full time and denies  depressive symptoms. Aims or involuntary movements was negative  . Location: The patient is doing better regarding paranoia and delusions. Less depressed and no suicidal thoughts. . Quality:  Denies ongoing hallucinations, hopelessness or feeling confused or delusional. . Severity: Mild  In past he has cut down saphris and when it reached 7.5mg  he got depressed and was admitted. He did reasonable till dose of 10mg . Medical/ he has high cholesterol and is working on weight loss and increase activities.   . Duration: since 2009  . Timing: No specific timimng  . Context: Finances and insurance can cause worries at times excessive  Labs reviewed slightly elevated Cholesterol. Patient will make appointment with primary care    Headache: Negative Seizure: Negative Paresthesias: Negative  Past Medical Family, Social History:  Past Medical History  Diagnosis Date  . Cancer (HCC)     Skin  . Squamous cell skin cancer, ala nasi    Family History  Problem  Relation Age of Onset  . Diabetes Mellitus II Brother   . Schizophrenia Maternal Uncle   . Alcohol abuse Neg Hx   . Anxiety disorder Neg Hx   . Bipolar disorder Neg Hx   . Dementia Neg Hx   . Depression Neg Hx   . Drug abuse Neg Hx   . Coronary artery disease Neg Hx    Social History   Social History  . Marital Status: Single    Spouse Name: N/A  . Number of Children: N/A  . Years of Education: N/A   Social History Main Topics  . Smoking status: Former Smoker -- 1.00 packs/day    Types: Cigarettes    Quit date: 10/30/2008  . Smokeless tobacco: Never Used     Comment: ni  . Alcohol Use: No  . Drug Use: No  . Sexual Activity: No   Other Topics Concern  . None   Social History Narrative    Outpatient Encounter Prescriptions as of 07/06/2015  Medication Sig  . asenapine (SAPHRIS) 5 MG SUBL 24 hr tablet One twice a day  . FLUoxetine (PROZAC) 20 MG capsule TAKE 3 CAPSULES (60 MG TOTAL) BY MOUTH DAILY.  . [DISCONTINUED] asenapine (SAPHRIS) 5 MG SUBL 24 hr tablet ONE TABLET DURING THE DAY. 2 AT NIGHT.  . [DISCONTINUED] FLUoxetine (PROZAC) 20 MG capsule TAKE 3 CAPSULES (60 MG TOTAL) BY MOUTH DAILY.   No facility-administered encounter medications on file as of 07/06/2015.     Review of Systems  Constitutional: Negative for fever and weight  loss.  Cardiovascular: Negative for chest pain.  Skin: Negative for rash.  Neurological: Negative for tremors.  Psychiatric/Behavioral: Negative for depression, suicidal ideas and hallucinations.   Filed Vitals:   07/06/15 1554  BP: 128/70  Pulse: 89  Height: 5\' 11"  (1.803 m)  Weight: 210 lb (95.255 kg)  SpO2: 95%    Physical Exam: Constitutional: General Appearance: alert, oriented, no acute distress and well nourished Musculoskeletal: Gait & Station: normal Patient leans: N/A  Psychiatric Specialty Exam: General Appearance: Casual and Well Groomed  Engineer, water::  Fair  Speech:  Clear and Coherent and Normal Rate   Volume:  Normal  Mood:  Euthymic   Affect:  Appropriate, Congruent and Full Range  Thought Process:  Coherent, Linear and Logical  Orientation:  Full (Time, Place, and Person)  Thought Content:  WDL  Suicidal Thoughts:  No  Homicidal Thoughts:  No  Memory:  Immediate;   Good Recent;   Good Remote;   Good  Judgement:  Fair  Insight:  Fair  Psychomotor Activity:  Normal  Concentration:  Good  Recall:  Negative  Akathisia:  Negative  Language-Intact  Fund of Knowledge-Average  Handed:  Right  AIMS (if indicated):   As noted in chart.  Assets:  Communication Skills Desire for Improvement Financial Resources/Insurance Intimacy Leisure Time Physical Health Resilience Social Support Talents/Skills     Assessment: Schizoaffective Disorder  Axis I: Schizoaffective Disorder. GAD  Plan:  Plan of Care:  PLAN:  1. Affirm with the patient that the medications are taken as ordered. Patient  expressed understanding of how their medications were to be used.    Laboratory:  No labs warranted at this time.   Psychotherapy: Therapy: brief supportive therapy provided.  Discussed psychosocial stressors in detail.  More than 50% of the visit was spent on individual therapy/counseling.   Medications:  Continue the following psychiatric medications as written prior to this appointment with the following changes::  Schizoaffective: lower  Saphris 5 mg sublingual during the day and 5 mg at night. Continue Prozac 60mg  per day. Refill sent  He understands the risk of having high cholesterol or elevated blood glucose considering he is on psychotropic medications. He follows with [facility].   -Risks and benefits, side effects and alternatives discussed with patient, he was given an opportunity to ask questions about his medication, illness, and treatment. All current psychiatric medications have been reviewed and discussed with the patient and adjusted as clinically appropriate. The  patient has been provided an accurate and updated list of the medications being now prescribed.   Routine PRN Medications:  Negative  Consultations: The patient was encouraged to keep all PCP and specialty clinic appointments.   Safety Concerns:   Patient told to call clinic if any problems occur. Patient advised to go to  ER  if he should develop SI/HI, side effects, or if symptoms worsen. Has crisis numbers to call if needed.    Other:  50% time spent in coordination of care, supportive therapy and also reviewed side effects and medication concerns.    Refer to primary care to review labs. 8. Patient was instructed to return to clinic in 2 months.  9. The patient was advised to call and cancel their mental health appointment within 24 hours of the appointment, if they are unable to keep the appointment, as well as the three no show and termination from clinic policy. 10. The patient expressed understanding of the plan and agrees with the above.  Time spent: 25 minutes.   07/06/2015 4:04 PM

## 2015-08-30 ENCOUNTER — Telehealth (HOSPITAL_COMMUNITY): Payer: Self-pay | Admitting: *Deleted

## 2015-08-30 MED ORDER — FLUOXETINE HCL 20 MG PO CAPS: ORAL_CAPSULE | ORAL | Status: DC

## 2015-08-30 MED ORDER — ASENAPINE MALEATE 5 MG SL SUBL: SUBLINGUAL_TABLET | SUBLINGUAL | Status: DC

## 2015-08-30 NOTE — Telephone Encounter (Signed)
Pt called for a refill for Prozac 20mg  and Saphris 5mg . Per Dr. De Nurse, pt is authorized for a refill for Prozac 20mg , #90 and Saphris 5mg ,#60. Prescription was sent to pharmacy. Pt has a f/u appt on 09/14/15. Called and informed pt of prescription status. Pt verbalizes understanding.

## 2015-09-14 ENCOUNTER — Encounter (HOSPITAL_COMMUNITY): Payer: Self-pay | Admitting: Psychiatry

## 2015-09-14 ENCOUNTER — Ambulatory Visit (INDEPENDENT_AMBULATORY_CARE_PROVIDER_SITE_OTHER): Payer: BC Managed Care – PPO | Admitting: Psychiatry

## 2015-09-14 VITALS — BP 140/85 | HR 84 | Wt 206.0 lb

## 2015-09-14 DIAGNOSIS — F411 Generalized anxiety disorder: Secondary | ICD-10-CM | POA: Diagnosis not present

## 2015-09-14 DIAGNOSIS — F258 Other schizoaffective disorders: Secondary | ICD-10-CM

## 2015-09-14 DIAGNOSIS — F259 Schizoaffective disorder, unspecified: Secondary | ICD-10-CM

## 2015-09-14 MED ORDER — FLUOXETINE HCL 20 MG PO CAPS
ORAL_CAPSULE | ORAL | Status: DC
Start: 2015-09-14 — End: 2015-12-08

## 2015-09-14 MED ORDER — ASENAPINE MALEATE 5 MG SL SUBL
SUBLINGUAL_TABLET | SUBLINGUAL | Status: DC
Start: 2015-09-14 — End: 2015-12-07

## 2015-09-14 NOTE — Progress Notes (Signed)
Patient ID: Ronald [name], male   DOB: [birth year], 50 y.o.   MRN: [id number]   [place] Follow-up Outpatient Visit  RICHERD VANDUZER [birth year] [id number] 50 y.o. 09/14/2015 3:39 PM  Chief Complaint:  HPI Comments: Mr. Betanzos is  a 50 y/o male with a past psychiatric history significant for Schizoaffective Disorder and Generalized anxiety disorder. The patient is referred for psychiatric services for medication management.  Patient started having paranoid delusions in 2009. He was feeling as the [place] are along with aliens and out against his country. His medications in the beginning was Risperdal  did not help and then it was changed to Saphris.  As of now He continues to do reasonable with no recurrence of any significant delusion. He is taking Saphris 5 mg one in the morning and 1 at night. This dose was decreased last visit. Says his mom keeps an eye on his symptoms. He has not detiorated with decreased dose.   He is working full time and denies  depressive symptoms. Aims or involuntary movements was negative  . Location: The patient is doing better regarding paranoia and delusions. Less depressed and no suicidal thoughts. . Quality:  Denies ongoing hallucinations, hopelessness or feeling confused or delusional. . Severity: Mild   Medical/ he has high cholesterol and is working on weight loss and increase activities.   . Duration: since 2009  . Timing: No specific timimng  . Context: Finances and insurance can cause worries at times excessive  Labs reviewed slightly elevated Cholesterol. Patient will make appointment with primary care    Headache: Negative Seizure: Negative Paresthesias: Negative  Past Medical Family, Social History:  Past Medical History  Diagnosis Date  . Cancer (HCC)     Skin  . Squamous cell skin cancer, ala nasi    Family History  Problem Relation Age of Onset  . Diabetes Mellitus II Brother   . Schizophrenia Maternal  Uncle   . Alcohol abuse Neg Hx   . Anxiety disorder Neg Hx   . Bipolar disorder Neg Hx   . Dementia Neg Hx   . Depression Neg Hx   . Drug abuse Neg Hx   . Coronary artery disease Neg Hx    Social History   Social History  . Marital Status: Single    Spouse Name: N/A  . Number of Children: N/A  . Years of Education: N/A   Social History Main Topics  . Smoking status: Former Smoker -- 1.00 packs/day    Types: Cigarettes    Quit date: 10/30/2008  . Smokeless tobacco: Never Used     Comment: ni  . Alcohol Use: No  . Drug Use: No  . Sexual Activity: No   Other Topics Concern  . None   Social History Narrative    Outpatient Encounter Prescriptions as of 09/14/2015  Medication Sig  . asenapine (SAPHRIS) 5 MG SUBL 24 hr tablet One twice a day  . FLUoxetine (PROZAC) 20 MG capsule TAKE 3 CAPSULES (60 MG TOTAL) BY MOUTH DAILY.  . [DISCONTINUED] asenapine (SAPHRIS) 5 MG SUBL 24 hr tablet One twice a day  . [DISCONTINUED] FLUoxetine (PROZAC) 20 MG capsule TAKE 3 CAPSULES (60 MG TOTAL) BY MOUTH DAILY.   No facility-administered encounter medications on file as of 09/14/2015.     Review of Systems  Constitutional: Negative for fever and weight loss.  Cardiovascular: Negative for chest pain and palpitations.  Skin: Negative for rash.  Neurological: Negative for tremors.  Psychiatric/Behavioral: Negative for  depression, suicidal ideas and hallucinations.   Filed Vitals:   09/14/15 1536  BP: 140/85  Pulse: 84  Weight: 206 lb (93.441 kg)    Physical Exam: Constitutional: General Appearance: alert, oriented, no acute distress and well nourished Musculoskeletal: Gait & Station: normal Patient leans: N/A  Psychiatric Specialty Exam: General Appearance: Casual and Well Groomed  Engineer, water::  Fair  Speech:  Clear and Coherent and Normal Rate  Volume:  Normal  Mood:  Euthymic   Affect:  Appropriate, Congruent and Full Range  Thought Process:  Coherent, Linear and Logical   Orientation:  Full (Time, Place, and Person)  Thought Content:  WDL  Suicidal Thoughts:  No  Homicidal Thoughts:  No  Memory:  Immediate;   Good Recent;   Good Remote;   Good  Judgement:  Fair  Insight:  Fair  Psychomotor Activity:  Normal  Concentration:  Good  Recall:  Negative  Akathisia:  Negative  Language-Intact  Fund of Knowledge-Average  Handed:  Right  AIMS (if indicated):   As noted in chart.  Assets:  Communication Skills Desire for Improvement Financial Resources/Insurance Intimacy Leisure Time Physical Health Resilience Social Support Talents/Skills     Assessment: Schizoaffective Disorder  Axis I: Schizoaffective Disorder. GAD. Weight change  Plan:  Plan of Care:  PLAN:  1. Affirm with the patient that the medications are taken as ordered. Patient  expressed understanding of how their medications were to be used.    Laboratory:  No labs warranted at this time.   Psychotherapy: Therapy: brief supportive therapy provided.  Discussed psychosocial stressors in detail.  More than 50% of the visit was spent on individual therapy/counseling.   Medications:  Continue the following psychiatric medications as written prior to this appointment with the following changes::  Schizoaffective: continue  Saphris 5 mg sublingual during the day and 5 mg at night. Continue Prozac 60mg  per day. Refill sent Weight changes: he is slowly loosing weight. Still working out to loose calories.  He understands the risk of having high cholesterol or elevated blood glucose considering he is on psychotropic medications. He [facility] with [facility].   -Risks and benefits, side effects and alternatives discussed with patient, he was given an opportunity to ask questions about his medication, illness, and treatment. All current psychiatric medications have been reviewed and discussed with the patient and adjusted as clinically appropriate. The patient has been provided an accurate  and updated list of the medications being now prescribed.   Routine PRN Medications:  Negative  Consultations: The patient was encouraged to keep all PCP and specialty clinic appointments.   Safety Concerns:   Patient told to call clinic if any problems occur. Patient advised to go to  ER  if he should develop SI/HI, side effects, or if symptoms worsen. Has crisis numbers to call if needed.    Other:  50% time spent in coordination of care, supportive therapy and also reviewed side effects and medication concerns.    Refer to primary care to review labs. 8. Patient was instructed to return to clinic in 2 months.  9. The patient was advised to call and cancel their mental health appointment within 24 hours of the appointment, if they are unable to keep the appointment, as well as the three no show and termination from clinic policy. 10. The patient expressed understanding of the plan and agrees with the above.    Time spent: 25 minutes.   09/14/2015 3:39 PM

## 2015-12-07 ENCOUNTER — Other Ambulatory Visit (HOSPITAL_COMMUNITY): Payer: Self-pay | Admitting: Psychiatry

## 2015-12-08 MED ORDER — FLUOXETINE HCL 20 MG PO CAPS: ORAL_CAPSULE | ORAL | Status: DC

## 2015-12-08 NOTE — Telephone Encounter (Signed)
Received medication request from [facility] for Prozac 20mg  and Saphris 5mg . Per Dr. De Nurse, medication request for Prozac 20mg , #90 and Saphris 5mg , #60 is authorized for refills. Rxs were sent to pharmacy. Pt is schedule for a f/u appt on 12/14/15. Called and informed pt of Rx status, pt verbalizes understanding.

## 2015-12-14 ENCOUNTER — Encounter (HOSPITAL_COMMUNITY): Payer: Self-pay | Admitting: Psychiatry

## 2015-12-14 ENCOUNTER — Ambulatory Visit (INDEPENDENT_AMBULATORY_CARE_PROVIDER_SITE_OTHER): Payer: BC Managed Care – PPO | Admitting: Psychiatry

## 2015-12-14 VITALS — BP 118/78 | HR 89 | Ht 71.0 in | Wt 207.0 lb

## 2015-12-14 DIAGNOSIS — F258 Other schizoaffective disorders: Secondary | ICD-10-CM

## 2015-12-14 DIAGNOSIS — F259 Schizoaffective disorder, unspecified: Secondary | ICD-10-CM

## 2015-12-14 DIAGNOSIS — F411 Generalized anxiety disorder: Secondary | ICD-10-CM | POA: Diagnosis not present

## 2015-12-14 MED ORDER — BENZTROPINE MESYLATE 1 MG PO TABS: 1.0000 mg | ORAL_TABLET | ORAL | Status: DC | PRN

## 2015-12-14 MED ORDER — ASENAPINE MALEATE 5 MG SL SUBL
SUBLINGUAL_TABLET | SUBLINGUAL | Status: DC
Start: 2015-12-14 — End: 2016-05-08

## 2015-12-14 MED ORDER — FLUOXETINE HCL 20 MG PO CAPS: ORAL_CAPSULE | ORAL | Status: DC

## 2015-12-14 NOTE — Progress Notes (Signed)
Patient ID: Ronald [name], male   DOB: 1965/09/12, 50 y.o.   MRN: [id number]   [place] Follow-up Outpatient Visit  Ronald [name] 04/30/1966 [id number] 50 y.o. 12/14/2015 3:41 PM  Chief Complaint:  HPI Comments: Mr. Osthoff is  a 50 y/o male with a past psychiatric history significant for Schizoaffective Disorder and Generalized anxiety disorder. The patient returns for psychiatric services for medication management.  Patient started having paranoid delusions in 2009. He was feeling as the [place] are along with aliens and out against his country. His medications in the beginning was Risperdal  did not help and then it was changed to Saphris.  No paranoia as of now he claims to work on weight maintenance. Has noticed some heaviness around job but randomly and infrequently no other tremors or involuntary movements noticeable. Prozac helps the anxiety there is no reported side effects   He is working full time and denies  depressive symptoms. Aims or involuntary movements was negative  Denies ongoing hallucinations, hopelessness or feeling confused or delusional. . Severity: Mild   Medical/ he has high cholesterol and is working on weight loss and increase activities.   . Duration: since 2009  . Timing: No specific timimng  . Context: Finances and insurance can cause worries at times excessive  Labs reviewed slightly elevated Cholesterol. Patient follows with primary care   Headache: Negative Seizure: Negative Paresthesias: Negative  Past Medical Family, Social History:  Past Medical History  Diagnosis Date  . Cancer (HCC)     Skin  . Squamous cell skin cancer, ala nasi    Family History  Problem Relation Age of Onset  . Diabetes Mellitus II Brother   . Schizophrenia Maternal Uncle   . Alcohol abuse Neg Hx   . Anxiety disorder Neg Hx   . Bipolar disorder Neg Hx   . Dementia Neg Hx   . Depression Neg Hx   . Drug abuse Neg Hx   . Coronary artery  disease Neg Hx    Social History   Social History  . Marital Status: Single    Spouse Name: N/A  . Number of Children: N/A  . Years of Education: N/A   Social History Main Topics  . Smoking status: Former Smoker -- 1.00 packs/day    Types: Cigarettes    Quit date: 10/30/2008  . Smokeless tobacco: Never Used     Comment: ni  . Alcohol Use: No  . Drug Use: No  . Sexual Activity: No   Other Topics Concern  . None   Social History Narrative    Outpatient Encounter Prescriptions as of 12/14/2015  Medication Sig  . asenapine (SAPHRIS) 5 MG SUBL 24 hr tablet ONE TWICE A DAY  . FLUoxetine (PROZAC) 20 MG capsule TAKE 3 CAPSULES (60 MG TOTAL) BY MOUTH DAILY.  . [DISCONTINUED] FLUoxetine (PROZAC) 20 MG capsule TAKE 3 CAPSULES (60 MG TOTAL) BY MOUTH DAILY.  . [DISCONTINUED] SAPHRIS 5 MG SUBL 24 hr tablet ONE TWICE A DAY  . benztropine (COGENTIN) 1 MG tablet Take 1 tablet (1 mg total) by mouth as needed for tremors.   No facility-administered encounter medications on file as of 12/14/2015.     Review of Systems  Constitutional: Negative for fever and weight loss.  Cardiovascular: Negative for chest pain and palpitations.  Skin: Negative for rash.  Neurological: Negative for tremors.  Psychiatric/Behavioral: Negative for depression, suicidal ideas and hallucinations. The patient is not nervous/anxious.    Filed Vitals:   12/14/15  1527  BP: 118/78  Pulse: 89  Height: 5\' 11"  (1.803 m)  Weight: 207 lb (93.895 kg)  SpO2: 95%    Physical Exam: Constitutional: General Appearance: alert, oriented, no acute distress and well nourished Musculoskeletal: Gait & Station: normal Patient leans: N/A  Psychiatric Specialty Exam: General Appearance: Casual and Well Groomed  Engineer, water::  Fair  Speech:  Clear and Coherent and Normal Rate  Volume:  Normal  Mood:  Euthymic   Affect:  Appropriate, Congruent and Full Range  Thought Process:  Coherent, Linear and Logical  Orientation:   Full (Time, Place, and Person)  Thought Content:  WDL  Suicidal Thoughts:  No  Homicidal Thoughts:  No  Memory:  Immediate;   Good Recent;   Good Remote;   Good  Judgement:  Fair  Insight:  Fair  Psychomotor Activity:  Normal  Concentration:  Good  Recall:  Negative  Akathisia:  Negative  Language-Intact  Fund of Knowledge-Average  Handed:  Right  AIMS (if indicated):   As noted in chart.  Assets:  Communication Skills Desire for Improvement Financial Resources/Insurance Intimacy Leisure Time Physical Health Resilience Social Support Talents/Skills     Assessment: Schizoaffective Disorder  Axis I: Schizoaffective Disorder. GAD. Weight change  Plan:  Plan of Care:  PLAN:  1. Affirm with the patient that the medications are taken as ordered. Patient  expressed understanding of how their medications were to be used.    Laboratory:  No labs warranted at this time.   Psychotherapy: Therapy: brief supportive therapy provided.  Discussed psychosocial stressors in detail.  More than 50% of the visit was spent on individual therapy/counseling.   Medications:  Continue the following psychiatric medications as written prior to this appointment with the following changes::  Schizoaffective: continue  Saphris 5 mg sublingual during the day and 5 mg at night. Continue Prozac 60mg  per day. Refill sent Add cogentin 1mg  qd prn for EPS if experience it. Explained and reviewed side effects  Weight changes: he is slowly loosing weight. Still working out to loose calories.  He understands the risk of having high cholesterol or elevated blood glucose considering he is on psychotropic medications. He follows with [facility] care.     Routine PRN Medications:  Negative  Consultations: The patient was encouraged to keep all PCP and specialty clinic appointments.   Safety Concerns:   Patient told to call clinic if any problems occur. Patient advised to go to  ER  if he should develop  SI/HI, side effects, or if symptoms worsen. Has crisis numbers to call if needed.         Time spent: 25 minutes.   12/14/2015 3:41 PM

## 2016-03-07 ENCOUNTER — Ambulatory Visit (HOSPITAL_COMMUNITY): Payer: Self-pay | Admitting: Psychiatry

## 2016-03-09 ENCOUNTER — Encounter (HOSPITAL_COMMUNITY): Payer: Self-pay | Admitting: Psychiatry

## 2016-03-09 ENCOUNTER — Ambulatory Visit (INDEPENDENT_AMBULATORY_CARE_PROVIDER_SITE_OTHER): Payer: BC Managed Care – PPO | Admitting: Psychiatry

## 2016-03-09 VITALS — BP 118/74 | HR 98 | Ht 71.0 in | Wt 208.0 lb

## 2016-03-09 DIAGNOSIS — F411 Generalized anxiety disorder: Secondary | ICD-10-CM | POA: Diagnosis not present

## 2016-03-09 DIAGNOSIS — F259 Schizoaffective disorder, unspecified: Secondary | ICD-10-CM

## 2016-03-09 DIAGNOSIS — F258 Other schizoaffective disorders: Secondary | ICD-10-CM

## 2016-03-09 MED ORDER — FLUOXETINE HCL 20 MG PO CAPS: ORAL_CAPSULE | ORAL | Status: DC

## 2016-03-09 NOTE — Progress Notes (Signed)
Patient ID: CLEVIE DO, male   DOB: [birth year], 50 y.o.   MRN: [id number]   [place] Follow-up Outpatient Visit  Ronald [name] 03-03-1966 [id number] 50 y.o. 03/09/2016 3:22 PM  Chief Complaint:  HPI Comments: Ronald [name] is  a 50 y/o male with a past psychiatric history significant for Schizoaffective Disorder and Generalized anxiety disorder. The patient returns for psychiatric services for medication management.  Patient started having paranoid delusions in 2009. He was feeling as the [place] are along with aliens and out against his country. His medications in the beginning was Risperdal  did not help and then it was changed to Saphris.  States no significant paranoia as of now depression anxiety is reasonably controlled on medication some anxiety symptoms at times but overall not too much guarded and continues to work.  Last visit added Cogentin that he is taking infrequently when he felt as if some stiffness in the jaw and that has helped but he is not taking it on a regular basis  Labs: will request  He is working full time and denies  depressive symptoms. Aims or involuntary movements was negative  Denies ongoing hallucinations, hopelessness or feeling confused or delusional. . Severity: Mild   Medical/ he has high cholesterol and is working on weight loss and increase activities.   . Duration: since 2009  . Timing: No specific timimng  . Context: Finances and insurance can cause worries at times excessive  Labs reviewed slightly elevated Cholesterol. Patient follows with primary care   Headache: Negative Seizure: Negative Paresthesias: Negative  Past Medical Family, Social History:  Past Medical History  Diagnosis Date  . Cancer (HCC)     Skin  . Squamous cell skin cancer, ala nasi    Family History  Problem Relation Age of Onset  . Diabetes Mellitus II Brother   . Schizophrenia Maternal Uncle   . Alcohol abuse Neg Hx   . Anxiety  disorder Neg Hx   . Bipolar disorder Neg Hx   . Dementia Neg Hx   . Depression Neg Hx   . Drug abuse Neg Hx   . Coronary artery disease Neg Hx    Social History   Social History  . Marital Status: Single    Spouse Name: N/A  . Number of Children: N/A  . Years of Education: N/A   Social History Main Topics  . Smoking status: Light Tobacco Smoker -- 1.00 packs/day    Types: Cigarettes    Last Attempt to Quit: 10/30/2008  . Smokeless tobacco: Never Used     Comment: ni  . Alcohol Use: No  . Drug Use: No  . Sexual Activity: No   Other Topics Concern  . None   Social History Narrative    Outpatient Encounter Prescriptions as of 03/09/2016  Medication Sig  . asenapine (SAPHRIS) 5 MG SUBL 24 hr tablet ONE TWICE A DAY  . benztropine (COGENTIN) 1 MG tablet Take 1 tablet (1 mg total) by mouth as needed for tremors.  Marland Kitchen FLUoxetine (PROZAC) 20 MG capsule TAKE 3 CAPSULES (60 MG TOTAL) BY MOUTH DAILY.  . [DISCONTINUED] FLUoxetine (PROZAC) 20 MG capsule TAKE 3 CAPSULES (60 MG TOTAL) BY MOUTH DAILY.   No facility-administered encounter medications on file as of 03/09/2016.     Review of Systems  Constitutional: Negative for fever and weight loss.  Cardiovascular: Negative for chest pain.  Skin: Negative for rash.  Neurological: Negative for tingling.  Psychiatric/Behavioral: Negative for depression, suicidal ideas and  hallucinations. The patient is not nervous/anxious.    Filed Vitals:   03/09/16 1512  BP: 118/74  Pulse: 98  Height: 5\' 11"  (1.803 m)  Weight: 208 lb (94.348 kg)  SpO2: 95%    Physical Exam: Constitutional: General Appearance: alert, oriented, no acute distress and well nourished Musculoskeletal: Gait & Station: normal Patient leans: N/A  Psychiatric Specialty Exam: General Appearance: Casual and Well Groomed  Engineer, water::  Fair  Speech:  Clear and Coherent and Normal Rate  Volume:  Normal  Mood:  Euthymic   Affect:  Appropriate, Congruent and Full  Range  Thought Process:  Coherent, Linear and Logical  Orientation:  Full (Time, Place, and Person)  Thought Content:  WDL  Suicidal Thoughts:  No  Homicidal Thoughts:  No  Memory:  Immediate;   Good Recent;   Good Remote;   Good  Judgement:  Fair  Insight:  Fair  Psychomotor Activity:  Normal  Concentration:  Good  Recall:  Negative  Akathisia:  Negative  Language-Intact  Fund of Knowledge-Average  Handed:  Right  AIMS (if indicated):   As noted in chart.  Assets:  Communication Skills Desire for Improvement Financial Resources/Insurance Intimacy Leisure Time Physical Health Resilience Social Support Talents/Skills     Assessment: Schizoaffective Disorder  Axis I: Schizoaffective Disorder. GAD. Weight change  Plan:  Plan of Care:  PLAN:  1. Affirm with the patient that the medications are taken as ordered. Patient  expressed understanding of how their medications were to be used.    Laboratory:  No labs warranted at this time.   Psychotherapy: Therapy: brief supportive therapy provided.  Discussed psychosocial stressors in detail.  More than 50% of the visit was spent on individual therapy/counseling.   Medications:  Continue the following psychiatric medications as written prior to this appointment with the following changes::  Schizoaffective: continue  Saphris 5 mg sublingual during the day and 5 mg at night. Has refill Continue Prozac 60mg  per day.  Refill sent Continue  cogentin 1mg  qd prn for EPS if experience it. Explained and reviewed side effects. No refills needed  Weight changes: he is slowly loosing weight. Still working out to loose calories.  He understands the risk of having high cholesterol or elevated blood glucose considering he is on psychotropic medications. Will request labs    Routine PRN Medications:  Negative  Consultations: The patient was encouraged to keep all PCP and specialty clinic appointments.   Safety Concerns:   Patient  told to call clinic if any problems occur. Patient advised to go to  ER  if he should develop SI/HI, side effects, or if symptoms worsen. Has crisis numbers to call if needed.         Time spent: 25 minutes.   03/09/2016 3:22 PM

## 2016-03-10 ENCOUNTER — Other Ambulatory Visit (HOSPITAL_COMMUNITY): Payer: Self-pay | Admitting: Psychiatry

## 2016-03-27 NOTE — Telephone Encounter (Signed)
Received fax from [facility] requesting refills for Prozac 20mg , #90. Per Dr. De Nurse, refill request is denied. Medication refill for Prozac 20mg , #90 was escribed 6/29 w/ 2 refills. Pt is schedule for a f/u appt on 9/21.

## 2016-05-06 LAB — HEMOGLOBIN A1C
Hgb A1c MFr Bld: 5.2 % (ref <5.7)
MEAN PLASMA GLUCOSE: 103 mg/dL

## 2016-05-06 LAB — LIPID PANEL W/DIRECT LDL/HDL RATIO
CHOL/HDL RATIO: 4.4 ratio (ref <=5.0)
CHOLESTEROL - DIR LDL/HDL RATIO: 189 mg/dL (ref 125–200)
HDL: 43 mg/dL (ref >=40)
LDL DIRECT: 146 mg/dL — AB (ref <130)
LDL:HDL Ratio: 3.4 Ratio
TRIGLYCERIDES: 100 mg/dL (ref <150)

## 2016-05-08 ENCOUNTER — Other Ambulatory Visit (HOSPITAL_COMMUNITY): Payer: Self-pay | Admitting: Psychiatry

## 2016-05-08 LAB — HEMOGLOBIN A1C, FINGERSTICK

## 2016-05-10 NOTE — Telephone Encounter (Signed)
Received fax from [facility] for Saphris 5mg . Per Dr. De Nurse, medication request for Saphris 5mg , #60 is authorized for a refill. Rx was sent to pharmacy. Pt is schedule for a f/u appt on 06/01/16. Called and informed pt of Rx status, pt verbalizes understanding.

## 2016-06-01 ENCOUNTER — Ambulatory Visit (HOSPITAL_COMMUNITY): Payer: Self-pay | Admitting: Psychiatry

## 2016-06-06 ENCOUNTER — Ambulatory Visit (INDEPENDENT_AMBULATORY_CARE_PROVIDER_SITE_OTHER): Payer: BC Managed Care – PPO | Admitting: Psychiatry

## 2016-06-06 ENCOUNTER — Encounter (HOSPITAL_COMMUNITY): Payer: Self-pay | Admitting: Psychiatry

## 2016-06-06 DIAGNOSIS — F258 Other schizoaffective disorders: Secondary | ICD-10-CM

## 2016-06-06 DIAGNOSIS — F411 Generalized anxiety disorder: Secondary | ICD-10-CM

## 2016-06-06 DIAGNOSIS — F259 Schizoaffective disorder, unspecified: Secondary | ICD-10-CM

## 2016-06-06 MED ORDER — ASENAPINE MALEATE 5 MG SL SUBL
SUBLINGUAL_TABLET | SUBLINGUAL | 2 refills | Status: DC
Start: 2016-06-06 — End: 2016-07-13

## 2016-06-06 MED ORDER — FLUOXETINE HCL 20 MG PO CAPS: ORAL_CAPSULE | ORAL | 2 refills | Status: DC

## 2016-06-06 NOTE — Progress Notes (Signed)
Patient ID: Ronald [name], male   DOB: December 06, 1965, 50 y.o.   MRN: [id number]   [facility] Follow-up Outpatient Visit  Ronald [name] Oct 28, 1965 [id number] 50 y.o. 06/06/2016 3:41 PM  Chief Complaint:  HPI Comments: Mr. Dowell is  a 50 y/o male with a past psychiatric history significant for Schizoaffective Disorder and Generalized anxiety disorder. The patient returns for psychiatric services for medication management.  Patient started having paranoid delusions in 2009. He was feeling as the [place] are along with aliens and out against his country. His medications in the beginning was Risperdal  did not help and then it was changed to Saphris.  Some car trouble as of [place] was no paranoia. He is taking medication and keeping himself active he does work. No significant paranoia or hallucinations endorsed  Labs: Lipid panel improved . Reviewed  HBa1c is normal  He is working full time and denies  depressive symptoms. Aims or involuntary movements was negative   . Severity: Mild to baseline not worsened.   Cholesterol panel improved  . Duration: since 2009  . Timing: No specific timimng  . Context: Finances and insurance can cause worries at times excessive  Labs reviewed slightly elevated Cholesterol. Patient follows with primary care   Headache: Negative Seizure: Negative Paresthesias: Negative  Past Medical Family, Social History:  Past Medical History:  Diagnosis Date  . Cancer (HCC)    Skin  . Squamous cell skin cancer, ala nasi    Family History  Problem Relation Age of Onset  . Diabetes Mellitus II Brother   . Schizophrenia Maternal Uncle   . Alcohol abuse Neg Hx   . Anxiety disorder Neg Hx   . Bipolar disorder Neg Hx   . Dementia Neg Hx   . Depression Neg Hx   . Drug abuse Neg Hx   . Coronary artery disease Neg Hx    Social History   Social History  . Marital status: Single    Spouse name: N/A  . Number of children: N/A  .  Years of education: N/A   Social History Main Topics  . Smoking status: Light Tobacco Smoker    Packs/day: 1.00    Types: Cigarettes    Last attempt to quit: 10/30/2008  . Smokeless tobacco: Never Used     Comment: ni  . Alcohol use No  . Drug use: No  . Sexual activity: No   Other Topics Concern  . None   Social History Narrative  . None    Outpatient Encounter Prescriptions as of 06/06/2016  Medication Sig  . asenapine (SAPHRIS) 5 MG SUBL 24 hr tablet ONE TWICE A DAY  . benztropine (COGENTIN) 1 MG tablet Take 1 tablet (1 mg total) by mouth as needed for tremors.  Marland Kitchen FLUoxetine (PROZAC) 20 MG capsule TAKE 3 CAPSULES (60 MG TOTAL) BY MOUTH DAILY.  . [DISCONTINUED] FLUoxetine (PROZAC) 20 MG capsule TAKE 3 CAPSULES (60 MG TOTAL) BY MOUTH DAILY.  . [DISCONTINUED] SAPHRIS 5 MG SUBL 24 hr tablet ONE TWICE A DAY   No facility-administered encounter medications on file as of 06/06/2016.      Review of Systems  Constitutional: Negative for fever and weight loss.  Cardiovascular: Negative for palpitations.  Skin: Negative for rash.  Neurological: Negative for tingling.  Psychiatric/Behavioral: Negative for depression, hallucinations and suicidal ideas. The patient is not nervous/anxious.    There were no vitals filed for this visit.  Physical Exam: Constitutional: General Appearance: alert, oriented, no acute distress  and well nourished Musculoskeletal: Gait & Station: normal Patient leans: N/A  Psychiatric Specialty Exam: General Appearance: Casual and Well Groomed  Engineer, water::  Fair  Speech:  Clear and Coherent and Normal Rate  Volume:  Normal  Mood:  Euthymic   Affect:  Appropriate, Congruent and Full Range  Thought Process:  Coherent, Linear and Logical  Orientation:  Full (Time, Place, and Person)  Thought Content:  WDL  Suicidal Thoughts:  No  Homicidal Thoughts:  No  Memory:  Immediate;   Good Recent;   Good Remote;   Good  Judgement:  Fair  Insight:  Fair   Psychomotor Activity:  Normal  Concentration:  Good  Recall:  Negative  Akathisia:  Negative  Language-Intact  Fund of Knowledge-Average  Handed:  Right  AIMS (if indicated):   As noted in chart.  Assets:  Communication Skills Desire for Improvement Financial Resources/Insurance Intimacy Leisure Time Physical Health Resilience Social Support Talents/Skills     Assessment: Schizoaffective Disorder  Axis I: Schizoaffective Disorder. GAD. Weight change  Plan:  Plan of Care:  PLAN:  1. Affirm with the patient that the medications are taken as ordered. Patient  expressed understanding of how their medications were to be used.    Laboratory:  No labs warranted at this time.   Psychotherapy: Therapy: brief supportive therapy provided.  Discussed psychosocial stressors in detail.  More than 50% of the visit was spent on individual therapy/counseling.   Medications:  Continue the following psychiatric medications as written prior to this appointment with the following changes::  Schizoaffective: continue  Saphris 5 mg sublingual during the day and 5 mg at night. Refills sent  Continue Prozac 60mg  per day. Refills sent  Continue  cogentin 1mg  qd prn for EPS if experience it. Explained and reviewed side effects. No refills needed takes it seldom  Weight changes: he is slowly loosing weight. Still working out to loose calories.  Eating [place]  Labs reviewed.     Routine PRN Medications:  Negative  Consultations: The patient was encouraged to keep all PCP and specialty clinic appointments.   Safety Concerns:   Patient told to call clinic if any problems occur. Patient advised to go to  ER  if he should develop SI/HI, side effects, or if symptoms worsen. Has crisis numbers to call if needed.         Time spent: 25 minutes.   06/06/2016 3:41 PM

## 2016-06-27 ENCOUNTER — Telehealth (HOSPITAL_COMMUNITY): Payer: Self-pay | Admitting: Psychiatry

## 2016-06-27 NOTE — Telephone Encounter (Signed)
Also, pt has an appt on 12/18. Provider will be out of office on that day. Please rschd future appt.

## 2016-06-27 NOTE — Telephone Encounter (Signed)
Medication management- pt phoned into office expressing concerns about Saphris prescription. Pt states insurance will not cover prescription effective 09/11/16. Informed pt, he currently has 2 prescriptions at [facility]. Pt would like to discuss a new medication he could take similar to Saphris. Pt is schedule for a f/u appt on 10/24.

## 2016-06-29 ENCOUNTER — Telehealth (HOSPITAL_COMMUNITY): Payer: Self-pay | Admitting: *Deleted

## 2016-06-29 NOTE — Telephone Encounter (Signed)
Received prior Auth from [facility] for Saphris, faxed to pharmacy @ [phone]. awaiting decision 24-72 hrs. Ref # [id number].

## 2016-07-04 ENCOUNTER — Ambulatory Visit (INDEPENDENT_AMBULATORY_CARE_PROVIDER_SITE_OTHER): Payer: BC Managed Care – PPO | Admitting: Psychiatry

## 2016-07-04 ENCOUNTER — Encounter (HOSPITAL_COMMUNITY): Payer: Self-pay | Admitting: Psychiatry

## 2016-07-04 VITALS — BP 120/70 | HR 95 | Resp 16 | Ht 71.0 in | Wt 198.6 lb

## 2016-07-04 DIAGNOSIS — F411 Generalized anxiety disorder: Secondary | ICD-10-CM

## 2016-07-04 DIAGNOSIS — F17210 Nicotine dependence, cigarettes, uncomplicated: Secondary | ICD-10-CM

## 2016-07-04 DIAGNOSIS — Z813 Family history of other psychoactive substance abuse and dependence: Secondary | ICD-10-CM

## 2016-07-04 DIAGNOSIS — Z833 Family history of diabetes mellitus: Secondary | ICD-10-CM

## 2016-07-04 DIAGNOSIS — F259 Schizoaffective disorder, unspecified: Secondary | ICD-10-CM

## 2016-07-04 DIAGNOSIS — F258 Other schizoaffective disorders: Secondary | ICD-10-CM | POA: Diagnosis not present

## 2016-07-04 DIAGNOSIS — Z818 Family history of other mental and behavioral disorders: Secondary | ICD-10-CM

## 2016-07-04 DIAGNOSIS — Z811 Family history of alcohol abuse and dependence: Secondary | ICD-10-CM | POA: Diagnosis not present

## 2016-07-04 MED ORDER — ARIPIPRAZOLE 15 MG PO TABS: 15.0000 mg | ORAL_TABLET | Freq: Every day | ORAL | 0 refills | Status: DC

## 2016-07-04 NOTE — Progress Notes (Signed)
Patient ID: Ronald [name], male   DOB: 12-Sep-1965, 50 y.o.   MRN: [id number]   [place] Follow-up Outpatient Visit  Ronald [name] 12/26/1965 [id number] 50 y.o. 07/04/2016 4:13 PM  Chief Complaint:  HPI Comments: Ronald [name] is  a 50 y/o male with a past psychiatric history significant for Schizoaffective Disorder and Generalized anxiety disorder. The patient returns for psychiatric services for medication management.  Patient started having paranoid delusions in 2009. He was feeling as the [place] are along with aliens and out against his country. His medications in the beginning was Risperdal  did not help and then it was changed to Saphris.  Says insurance would not cover saphris and even if it does after prior authorization because 10 $200 for his wanting to change the medication. We talked about Abilify, Restoril, olanzapine or Seroquel which are the older second admission antipsychotics after discussion he agreed to Abilify   Labs: Lipid panel . Reviewed  HBa1c is normal  He is working full time and denies  depressive symptoms. Aims or involuntary movements was negative   . Severity: Mild to baseline not worsened.   Cholesterol panel improved  . Duration: since 2009  . Timing: No specific timimng  . Context: Finances and insurance can cause worries at times excessive  Labs reviewed slightly elevated Cholesterol. Patient follows with primary care   Headache: Negative Seizure: Negative Paresthesias: Negative  Past Medical Family, Social History:  Past Medical History:  Diagnosis Date  . Cancer (HCC)    Skin  . Squamous cell skin cancer, ala nasi    Family History  Problem Relation Age of Onset  . Diabetes Mellitus II Brother   . Schizophrenia Maternal Uncle   . Alcohol abuse Neg Hx   . Anxiety disorder Neg Hx   . Bipolar disorder Neg Hx   . Dementia Neg Hx   . Depression Neg Hx   . Drug abuse Neg Hx   . Coronary artery disease Neg Hx     Social History   Social History  . Marital status: Single    Spouse name: N/A  . Number of children: N/A  . Years of education: N/A   Social History Main Topics  . Smoking status: Light Tobacco Smoker    Packs/day: 1.00    Types: Cigarettes, Cigars    Last attempt to quit: 10/30/2008  . Smokeless tobacco: Never Used     Comment: ni  . Alcohol use No  . Drug use: No  . Sexual activity: No   Other Topics Concern  . None   Social History Narrative  . None    Outpatient Encounter Prescriptions as of 07/04/2016  Medication Sig  . asenapine (SAPHRIS) 5 MG SUBL 24 hr tablet ONE TWICE A DAY  . benztropine (COGENTIN) 1 MG tablet Take 1 tablet (1 mg total) by mouth as needed for tremors.  Marland Kitchen FLUoxetine (PROZAC) 20 MG capsule TAKE 3 CAPSULES (60 MG TOTAL) BY MOUTH DAILY.  Marland Kitchen ARIPiprazole (ABILIFY) 15 MG tablet Take 1 tablet (15 mg total) by mouth daily. Will taper down stop saphris and start abilify   No facility-administered encounter medications on file as of 07/04/2016.      Review of Systems  Constitutional: Negative for fever and weight loss.  Cardiovascular: Negative for palpitations.  Skin: Negative for rash.  Neurological: Negative for tremors.  Psychiatric/Behavioral: Negative for depression, hallucinations and suicidal ideas. The patient is not nervous/anxious.    Vitals:   07/04/16 1552  BP: 120/70  BP Location: Right Arm  Patient Position: Sitting  Cuff Size: Normal  Pulse: 95  Resp: 16  SpO2: 92%  Weight: 198 lb 9.6 oz (90.1 kg)  Height: 5\' 11"  (1.803 m)    Physical Exam: Constitutional: General Appearance: alert, oriented, no acute distress and well nourished Musculoskeletal: Gait & Station: normal Patient leans: N/A  Psychiatric Specialty Exam: General Appearance: Casual and Well Groomed  Engineer, water::  Fair  Speech:  Clear and Coherent and Normal Rate  Volume:  Normal  Mood:  Euthymic   Affect:  Appropriate, Congruent and Full Range   Thought Process:  Coherent, Linear and Logical  Orientation:  Full (Time, Place, and Person)  Thought Content:  WDL  Suicidal Thoughts:  No  Homicidal Thoughts:  No  Memory:  Immediate;   Good Recent;   Good Remote;   Good  Judgement:  Fair  Insight:  Fair  Psychomotor Activity:  Normal  Concentration:  Good  Recall:  Negative  Akathisia:  Negative  Language-Intact  Fund of Knowledge-Average  Handed:  Right  AIMS (if indicated):   As noted in chart.  Assets:  Communication Skills Desire for Improvement Financial Resources/Insurance Intimacy Leisure Time Physical Health Resilience Social Support Talents/Skills     Assessment: Schizoaffective Disorder  Axis I: Schizoaffective Disorder. GAD. Weight change  Plan:  Plan of Care:  PLAN:  1. Affirm with the patient that the medications are taken as ordered. Patient  expressed understanding of how their medications were to be used.    Laboratory:  No labs warranted at this time.   Psychotherapy: Therapy: brief supportive therapy provided.  Discussed psychosocial stressors in detail.  More than 50% of the visit was spent on individual therapy/counseling.   Medications:  Continue the following psychiatric medications as written prior to this appointment with the following changes::  Schizoaffective: its unfortunate but because of cost will change saphris to abilify 15mg . if its expensive as well will consider risperdal or other . Cautioned to keep under observation of developing  Paranoia, concerns or worries/hallucinations.  Continue Prozac 60mg  per day.  Continue  cogentin 1mg  qd prn for EPS if experience it. Explained and reviewed side effects. No refills needed takes it seldom  Weight changes: he is slowly loosing weight. Still working out to loose calories.  Eating [place]  Labs reviewed.     Routine PRN Medications:  Negative  Consultations: The patient was encouraged to keep all PCP and specialty clinic  appointments.   Safety Concerns:   Patient told to call clinic if any problems occur. Patient advised to go to  ER  if he should develop SI/HI, side effects, or if symptoms worsen. Has crisis numbers to call if needed.         Time spent: 25 minutes.   07/04/2016 4:13 PM

## 2016-07-13 ENCOUNTER — Telehealth (HOSPITAL_COMMUNITY): Payer: Self-pay | Admitting: *Deleted

## 2016-07-13 ENCOUNTER — Other Ambulatory Visit (HOSPITAL_COMMUNITY): Payer: Self-pay | Admitting: Medical

## 2016-07-13 MED ORDER — ASENAPINE MALEATE 5 MG SL SUBL: SUBLINGUAL_TABLET | SUBLINGUAL | 0 refills | Status: DC

## 2016-07-13 NOTE — Telephone Encounter (Signed)
Medication management- pt was seen in office on 07/04/16.  Pt states he would like to continue Saphris 5mg  until December, then he will begin the transition to Abilify 15mg . Pt will need a new prescription sent to pharmacy for [place]. Please review and advise. Thank you.

## 2016-07-13 NOTE — Telephone Encounter (Signed)
RX for 30 day supply sent to pharmacy on record

## 2016-07-14 NOTE — Telephone Encounter (Signed)
Return call to pt. Pt was informed prescription was sent to pharmacy. Pt verbalizes understanding.

## 2016-07-25 ENCOUNTER — Ambulatory Visit (HOSPITAL_COMMUNITY): Payer: Self-pay | Admitting: Psychiatry

## 2016-08-17 ENCOUNTER — Ambulatory Visit (INDEPENDENT_AMBULATORY_CARE_PROVIDER_SITE_OTHER): Payer: BC Managed Care – PPO | Admitting: Psychiatry

## 2016-08-17 ENCOUNTER — Encounter (HOSPITAL_COMMUNITY): Payer: Self-pay | Admitting: Psychiatry

## 2016-08-17 VITALS — BP 124/70 | HR 94 | Resp 16 | Ht 71.0 in | Wt 198.0 lb

## 2016-08-17 DIAGNOSIS — F258 Other schizoaffective disorders: Secondary | ICD-10-CM

## 2016-08-17 DIAGNOSIS — F411 Generalized anxiety disorder: Secondary | ICD-10-CM | POA: Diagnosis not present

## 2016-08-17 DIAGNOSIS — Z79899 Other long term (current) drug therapy: Secondary | ICD-10-CM

## 2016-08-17 DIAGNOSIS — Z833 Family history of diabetes mellitus: Secondary | ICD-10-CM | POA: Diagnosis not present

## 2016-08-17 DIAGNOSIS — Z818 Family history of other mental and behavioral disorders: Secondary | ICD-10-CM

## 2016-08-17 DIAGNOSIS — F259 Schizoaffective disorder, unspecified: Secondary | ICD-10-CM

## 2016-08-17 DIAGNOSIS — F17210 Nicotine dependence, cigarettes, uncomplicated: Secondary | ICD-10-CM

## 2016-08-17 MED ORDER — ARIPIPRAZOLE 15 MG PO TABS: 7.5000 mg | ORAL_TABLET | Freq: Every day | ORAL | 0 refills | Status: DC

## 2016-08-17 NOTE — Progress Notes (Signed)
Patient ID: Ronald [name], male   DOB: Jan 22, 1966, 50 y.o.   MRN: [id number]   [place] Follow-up Outpatient Visit  Ronald [name] 08-11-1966 [id number] 50 y.o. 08/17/2016 3:39 PM  Chief Complaint:  HPI Comments: Mr. Popwell is  a 50 y/o male with a past psychiatric history significant for Schizoaffective Disorder and Generalized anxiety disorder. The patient returns for psychiatric services for medication management.  Brief history as per previous notes" Patient started having paranoid delusions in 2009. He was feeling as the [place] are along with aliens and out against his country. His medications in the beginning was Risperdal  did not help and then it was changed to Saphris"  Because his insurance would no longer cover Saphris restarted on Abilify last visit 50 mg he started taking 34 days ago and he is feeling his eyes are red and also having dry mouth is no recurrence of paranoia or hallucinations but he does want to consider to adjust the medication Labs have been normal as reviewed before Anxiety is baseline Somewhat increased anxiety because of the recent change in medication concern about side effect  Duration: since 2009 Some mild tremors  Context for anxiety" prior episode and finances. Side effects   Past Medical Family, Social History:  Past Medical History:  Diagnosis Date  . Cancer (HCC)    Skin  . Squamous cell skin cancer, ala nasi    Family History  Problem Relation Age of Onset  . Diabetes Mellitus II Brother   . Schizophrenia Maternal Uncle   . Alcohol abuse Neg Hx   . Anxiety disorder Neg Hx   . Bipolar disorder Neg Hx   . Dementia Neg Hx   . Depression Neg Hx   . Drug abuse Neg Hx   . Coronary artery disease Neg Hx    Social History   Social History  . Marital status: Single    Spouse name: N/A  . Number of children: N/A  . Years of education: N/A   Social History Main Topics  . Smoking status: Light Tobacco Smoker   Packs/day: 1.00    Types: Cigarettes, Cigars    Last attempt to quit: 10/30/2008  . Smokeless tobacco: Never Used  . Alcohol use No  . Drug use: No  . Sexual activity: No   Other Topics Concern  . None   Social History Narrative  . None    Outpatient Encounter Prescriptions as of 08/17/2016  Medication Sig  . ARIPiprazole (ABILIFY) 15 MG tablet Take 1 tablet (15 mg total) by mouth daily. Will taper down stop saphris and start abilify  . benztropine (COGENTIN) 1 MG tablet Take 1 tablet (1 mg total) by mouth as needed for tremors.  Marland Kitchen FLUoxetine (PROZAC) 20 MG capsule TAKE 3 CAPSULES (60 MG TOTAL) BY MOUTH DAILY.  Marland Kitchen asenapine (SAPHRIS) 5 MG SUBL 24 hr tablet ONE TWICE A DAY (Patient not taking: Reported on 08/17/2016)   No facility-administered encounter medications on file as of 08/17/2016.      Review of Systems  Cardiovascular: Negative for chest pain.  Neurological: Negative for tingling and tremors.  Psychiatric/Behavioral: Negative for hallucinations and suicidal ideas. The patient is not nervous/anxious.    Vitals:   08/17/16 1528  BP: 124/70  BP Location: Right Arm  Patient Position: Sitting  Cuff Size: Normal  Pulse: 94  Resp: 16  SpO2: 94%  Weight: 198 lb (89.8 kg)  Height: 5\' 11"  (1.803 m)     Psychiatric Specialty Exam:  General Appearance: Casual and Well Groomed  Engineer, water::  Fair  Speech:  Clear and Coherent and Normal Rate  Volume:  Normal  Mood:  Euthymic but somewhat concern of side effects  Affect:  reactive  Thought Process:  Coherent, Linear and Logical  Orientation:  Full (Time, Place, and Person)  Thought Content:  WDL  Suicidal Thoughts:  No  Homicidal Thoughts:  No  Memory:  Immediate;   Good Recent;   Good Remote;   Good  Judgement:  Fair  Insight:  Fair  Psychomotor Activity:  Normal  Concentration:  Good  Recall:  Negative  Akathisia:  Negative  Language-Intact  Fund of Knowledge-Average  Handed:  Right  AIMS (if indicated):    As noted in chart.  Assets:  Communication Skills Desire for Improvement Financial Resources/Insurance Intimacy Leisure Time Physical Health Resilience Social Support Talents/Skills     Assessment: Schizoaffective Disorder  Axis I: Schizoaffective Disorder. GAD. Weight change  Plan:  1. Schizoaffective disorder: lower abilify to 7.5mg  will take half of 15mg  . Patient to call back if still has concerns or side effects 2. GAD: continue prozac 3. Tremors: not worsened. Takes cogentin prn  Will need to assess and change to olanzapine small dose of 5mg  if he calls back for concerns on abilify.  Understands risk, side effects FU otherwise in 4 weeks     08/17/2016 3:39 PM

## 2016-08-22 ENCOUNTER — Telehealth (HOSPITAL_COMMUNITY): Payer: Self-pay | Admitting: Psychiatry

## 2016-08-22 NOTE — Telephone Encounter (Signed)
Seen De Nurse on 12/7 and was to told to give a report on how the medication is working.  Pt states the medication is working well and he is having no problems.

## 2016-08-28 ENCOUNTER — Ambulatory Visit (HOSPITAL_COMMUNITY): Payer: Self-pay | Admitting: Psychiatry

## 2016-09-14 ENCOUNTER — Ambulatory Visit (INDEPENDENT_AMBULATORY_CARE_PROVIDER_SITE_OTHER): Payer: BC Managed Care – PPO | Admitting: Psychiatry

## 2016-09-14 ENCOUNTER — Encounter (HOSPITAL_COMMUNITY): Payer: Self-pay | Admitting: Psychiatry

## 2016-09-14 VITALS — BP 136/86 | HR 92 | Resp 16 | Ht 71.0 in | Wt 204.0 lb

## 2016-09-14 DIAGNOSIS — F411 Generalized anxiety disorder: Secondary | ICD-10-CM | POA: Diagnosis not present

## 2016-09-14 DIAGNOSIS — Z833 Family history of diabetes mellitus: Secondary | ICD-10-CM

## 2016-09-14 DIAGNOSIS — F258 Other schizoaffective disorders: Secondary | ICD-10-CM | POA: Diagnosis not present

## 2016-09-14 DIAGNOSIS — Z79899 Other long term (current) drug therapy: Secondary | ICD-10-CM

## 2016-09-14 DIAGNOSIS — F259 Schizoaffective disorder, unspecified: Secondary | ICD-10-CM

## 2016-09-14 DIAGNOSIS — Z818 Family history of other mental and behavioral disorders: Secondary | ICD-10-CM

## 2016-09-14 DIAGNOSIS — Z87891 Personal history of nicotine dependence: Secondary | ICD-10-CM

## 2016-09-14 MED ORDER — ARIPIPRAZOLE 10 MG PO TABS: 10.0000 mg | ORAL_TABLET | Freq: Every day | ORAL | 0 refills | Status: DC

## 2016-09-14 MED ORDER — BENZTROPINE MESYLATE 1 MG PO TABS: 1.0000 mg | ORAL_TABLET | ORAL | 0 refills | Status: DC | PRN

## 2016-09-14 NOTE — Progress Notes (Signed)
Patient ID: Ronald [name], male   DOB: 03/22/1966, 51 y.o.   MRN: [id number]   [place] Follow-up Outpatient Visit  Ronald [name] 1965-11-18 [id number] 51 y.o. 09/14/2016 4:26 PM  Chief Complaint:  HPI Comments: Ronald [name] is  a 51 y/o male with a past psychiatric history significant for Schizoaffective Disorder and Generalized anxiety disorder. The patient returns for psychiatric services for medication management.  Brief history as per previous notes" Patient started having paranoid delusions in 2009. He was feeling as the [place] are along with aliens and out against his country. His medications in the beginning was Risperdal  did not help and then it was changed to Saphris"  Because of insurance recently of changes Saphris to Abilify he was having red eyes on Abilify 15 mg we cut it down to 7.5 mg is helping the paranoia he is not hearing voices but he does talk to himself this a little bit over anxious and having difficulty sleeping  Anxiety has gone somewhat worse compared to back when he was in [place] Duration is since 2009 No tremors   Context for anxiety" prior episode and finances. Side effects   Past Medical Family, Social History:  Past Medical History:  Diagnosis Date  . Cancer (HCC)    Skin  . Squamous cell skin cancer, ala nasi    Family History  Problem Relation Age of Onset  . Diabetes Mellitus II Brother   . Schizophrenia Maternal Uncle   . Alcohol abuse Neg Hx   . Anxiety disorder Neg Hx   . Bipolar disorder Neg Hx   . Dementia Neg Hx   . Depression Neg Hx   . Drug abuse Neg Hx   . Coronary artery disease Neg Hx    Social History   Social History  . Marital status: Single    Spouse name: N/A  . Number of children: N/A  . Years of education: N/A   Social History Main Topics  . Smoking status: Former Smoker    Packs/day: 1.00    Types: Cigarettes, Cigars    Quit date: 10/30/2008  . Smokeless tobacco: Never Used  . Alcohol  use 4.8 oz/week    8 Cans of beer per week  . Drug use: No  . Sexual activity: No   Other Topics Concern  . None   Social History Narrative  . None    Outpatient Encounter Prescriptions as of 09/14/2016  Medication Sig  . ARIPiprazole (ABILIFY) 10 MG tablet Take 1 tablet (10 mg total) by mouth daily.  . benztropine (COGENTIN) 1 MG tablet Take 1 tablet (1 mg total) by mouth as needed for tremors.  Marland Kitchen FLUoxetine (PROZAC) 20 MG capsule TAKE 3 CAPSULES (60 MG TOTAL) BY MOUTH DAILY.  . [DISCONTINUED] ARIPiprazole (ABILIFY) 15 MG tablet Take 0.5 tablets (7.5 mg total) by mouth daily.  . [DISCONTINUED] benztropine (COGENTIN) 1 MG tablet Take 1 tablet (1 mg total) by mouth as needed for tremors.   No facility-administered encounter medications on file as of 09/14/2016.      Review of Systems  Cardiovascular: Negative for palpitations.  Neurological: Negative for tingling and tremors.  Psychiatric/Behavioral: Negative for depression, hallucinations and suicidal ideas. The patient is not nervous/anxious.    Vitals:   09/14/16 1615  BP: 136/86  BP Location: Right Arm  Patient Position: Sitting  Cuff Size: Normal  Pulse: 92  Resp: 16  SpO2: 96%  Weight: 204 lb (92.5 kg)  Height: 5\' 11"  (1.803 m)  Psychiatric Specialty Exam: General Appearance: Casual and Well Groomed  Eye Contact::  Fair  Speech:  Clear and Coherent and Normal Rate  Volume:  Normal  Mood:  euthymic  Affect:  reactive  Thought Process:  Coherent, Linear and Logical  Orientation:  Full (Time, Place, and Person)  Thought Content:  WDL  Suicidal Thoughts:  No  Homicidal Thoughts:  No  Memory:  Immediate;   Good Recent;   Good Remote;   Good  Judgement:  Fair  Insight:  Fair  Psychomotor Activity:  Normal  Concentration:  Good  Recall:  Negative  Akathisia:  Negative  Language-Intact  Fund of Knowledge-Average  Handed:  Right  AIMS (if indicated):   As noted in chart.  Assets:  Communication  Skills Desire for Improvement Financial Resources/Insurance Intimacy Leisure Time Physical Health Resilience Social Support Talents/Skills     Assessment: Schizoaffective Disorder  Axis I: Schizoaffective Disorder. GAD. Weight change  Plan:  1. Schizoaffective disorder: increase abilify to 10mg  and take during the day not at night. Will re assess if talking or insomnia improves 2. GAD: continue prozac continue prozac Takes cogentin prn for tremors Will need to assess for olanzapine or seroquel and its price. He will talk to [facility] as well if abilify doesn't work Reviewed sleep hygiene and weight maintenance and work on weight loss.  Understands risk, side effects FU otherwise in 4 weeks     09/14/2016 4:26 PM

## 2016-10-09 ENCOUNTER — Other Ambulatory Visit (HOSPITAL_COMMUNITY): Payer: Self-pay | Admitting: Psychiatry

## 2016-10-10 ENCOUNTER — Ambulatory Visit (HOSPITAL_COMMUNITY): Payer: Self-pay | Admitting: Psychiatry

## 2016-10-19 ENCOUNTER — Telehealth (HOSPITAL_COMMUNITY): Payer: Self-pay | Admitting: Psychiatry

## 2016-10-19 NOTE — Telephone Encounter (Signed)
Can send refill 

## 2016-10-19 NOTE — Telephone Encounter (Signed)
Pt needs refill on prozac and abilify

## 2016-10-20 MED ORDER — ARIPIPRAZOLE 10 MG PO TABS: 10.0000 mg | ORAL_TABLET | Freq: Every day | ORAL | 0 refills | Status: DC

## 2016-10-20 NOTE — Telephone Encounter (Signed)
Medication refill- received fax from [facility] pharmacy requesting a refill for Abilify and Prozac. Per Dr. De Nurse, refills are authorize for Abilify 10mg , #30 and Prozac 20mg , #90. Prescriptions were sent to pharmacy. Called and informed pt of refill status. Pt's next apt is schedule for 10/24/16. Pt verbalizes understanding.

## 2016-10-22 NOTE — Telephone Encounter (Signed)
Medication refill- received fax from [facility] pharmacy requesting a refill for Abilify and Prozac. Per Dr. De Nurse, refills are authorize for Abilify 10mg , #30 and Prozac 20mg , #90. Prescriptions were sent to pharmacy. Called and informed pt of refill status. Pt's next apt is schedule for 10/24/16. Pt verbalizes understanding.

## 2016-10-24 ENCOUNTER — Encounter (HOSPITAL_COMMUNITY): Payer: Self-pay | Admitting: Psychiatry

## 2016-10-24 ENCOUNTER — Ambulatory Visit (INDEPENDENT_AMBULATORY_CARE_PROVIDER_SITE_OTHER): Payer: BC Managed Care – PPO | Admitting: Psychiatry

## 2016-10-24 VITALS — BP 114/70 | HR 86 | Resp 16 | Ht 71.0 in | Wt 203.0 lb

## 2016-10-24 DIAGNOSIS — F411 Generalized anxiety disorder: Secondary | ICD-10-CM

## 2016-10-24 DIAGNOSIS — Z79899 Other long term (current) drug therapy: Secondary | ICD-10-CM

## 2016-10-24 DIAGNOSIS — F258 Other schizoaffective disorders: Secondary | ICD-10-CM | POA: Diagnosis not present

## 2016-10-24 DIAGNOSIS — Z833 Family history of diabetes mellitus: Secondary | ICD-10-CM | POA: Diagnosis not present

## 2016-10-24 DIAGNOSIS — Z87891 Personal history of nicotine dependence: Secondary | ICD-10-CM

## 2016-10-24 DIAGNOSIS — F259 Schizoaffective disorder, unspecified: Secondary | ICD-10-CM

## 2016-10-24 DIAGNOSIS — Z818 Family history of other mental and behavioral disorders: Secondary | ICD-10-CM

## 2016-10-24 MED ORDER — ARIPIPRAZOLE 10 MG PO TABS: 10.0000 mg | ORAL_TABLET | Freq: Every day | ORAL | 1 refills | Status: DC

## 2016-10-24 MED ORDER — FLUOXETINE HCL 20 MG PO CAPS: ORAL_CAPSULE | ORAL | 1 refills | Status: DC

## 2016-10-24 NOTE — Progress Notes (Signed)
Patient ID: Ronald [name], male   DOB: 1966/08/10, 51 y.o.   MRN: [id number]   [place] Follow-up Outpatient Visit  DALE ZBOROWSKI April 14, 1966 [id number] 51 y.o. 10/24/2016 4:25 PM  Chief Complaint:  HPI Comments: Mr. Fauble is  a 51 y/o male with a past psychiatric history significant for Schizoaffective Disorder and Generalized anxiety disorder. The patient returns for psychiatric services for medication management.  Brief history as per previous notes" Patient started having paranoid delusions in 2009. He was feeling as the [place] are along with aliens and out against his country. His medications in the beginning was Risperdal  did not help and then it was changed to Saphris"  He is tolerating Abilify better some yawning. No significant paranoia. Was on saphris before He was having red eyes on abilify 15mg  but 10mg  is better Anxiety not worsened.  Duration is since 2009 No tremors   Context for anxiety" prior episode and finances. Side effects   Past Medical Family, Social History:  Past Medical History:  Diagnosis Date  . Cancer (HCC)    Skin  . Squamous cell skin cancer, ala nasi    Family History  Problem Relation Age of Onset  . Diabetes Mellitus II Brother   . Schizophrenia Maternal Uncle   . Alcohol abuse Neg Hx   . Anxiety disorder Neg Hx   . Bipolar disorder Neg Hx   . Dementia Neg Hx   . Depression Neg Hx   . Drug abuse Neg Hx   . Coronary artery disease Neg Hx    Social History   Social History  . Marital status: Single    Spouse name: N/A  . Number of children: N/A  . Years of education: N/A   Social History Main Topics  . Smoking status: Former Smoker    Packs/day: 1.00    Types: Cigarettes, Cigars    Quit date: 10/30/2008  . Smokeless tobacco: Never Used  . Alcohol use 4.8 oz/week    8 Cans of beer per week  . Drug use: No  . Sexual activity: No   Other Topics Concern  . None   Social History Narrative  . None     Outpatient Encounter Prescriptions as of 10/24/2016  Medication Sig  . ARIPiprazole (ABILIFY) 10 MG tablet Take 1 tablet (10 mg total) by mouth daily.  Marland Kitchen FLUoxetine (PROZAC) 20 MG capsule TAKE 3 CAPSULES (60 MG TOTAL) BY MOUTH DAILY.  . [DISCONTINUED] ARIPiprazole (ABILIFY) 10 MG tablet Take 1 tablet (10 mg total) by mouth daily.  . [DISCONTINUED] FLUoxetine (PROZAC) 20 MG capsule TAKE 3 CAPSULES (60 MG TOTAL) BY MOUTH DAILY.  . benztropine (COGENTIN) 1 MG tablet Take 1 tablet (1 mg total) by mouth as needed for tremors. (Patient not taking: Reported on 10/24/2016)   No facility-administered encounter medications on file as of 10/24/2016.      Review of Systems  Cardiovascular: Negative for chest pain.  Skin: Negative for rash.  Neurological: Negative for tingling and tremors.  Psychiatric/Behavioral: Negative for hallucinations and suicidal ideas. The patient is not nervous/anxious.    Vitals:   10/24/16 1611  BP: 114/70  BP Location: Right Arm  Patient Position: Sitting  Cuff Size: Normal  Pulse: 86  Resp: 16  SpO2: 96%  Weight: 203 lb (92.1 kg)  Height: 5\' 11"  (1.803 m)     Psychiatric Specialty Exam: General Appearance: Casual and Well Groomed  Eye Contact::  Fair  Speech:  Clear and Coherent and Normal  Rate  Volume:  Normal  Mood:  fair  Affect:  Somewhat constricted  Thought Process:  Coherent, Linear and Logical  Orientation:  Full (Time, Place, and Person)  Thought Content:  WDL  Suicidal Thoughts:  No  Homicidal Thoughts:  No  Memory:  Immediate;   Good Recent;   Good Remote;   Good  Judgement:  Fair  Insight:  Fair  Psychomotor Activity:  Normal  Concentration:  Good  Recall:  Negative  Akathisia:  Negative  Language-Intact  Fund of Knowledge-Average  Handed:  Right  AIMS (if indicated):   As noted in chart.  Assets:  Communication Skills Desire for Improvement Financial Resources/Insurance Intimacy Leisure Time Physical  Health Resilience Social Support Talents/Skills     Assessment: Schizoaffective Disorder  Axis I: Schizoaffective Disorder. GAD. Weight change  Plan:  1. Schizoaffective disorder:manageable. Continue abilify 10mg .  2. GAD: not worsened. Continue prozac Takes cogentin prn for tremors. No refill needed Weight : managing it better.   As of now will not change to olanzapine . abilify refill done.  Weight maintenance discussed. FU 2 months.      10/24/2016 4:25 PM

## 2016-12-19 ENCOUNTER — Ambulatory Visit (INDEPENDENT_AMBULATORY_CARE_PROVIDER_SITE_OTHER): Payer: BC Managed Care – PPO | Admitting: Psychiatry

## 2016-12-19 ENCOUNTER — Encounter (HOSPITAL_COMMUNITY): Payer: Self-pay | Admitting: Psychiatry

## 2016-12-19 VITALS — BP 114/70 | HR 86 | Resp 16 | Ht 71.0 in | Wt 203.0 lb

## 2016-12-19 DIAGNOSIS — Z87891 Personal history of nicotine dependence: Secondary | ICD-10-CM | POA: Diagnosis not present

## 2016-12-19 DIAGNOSIS — F411 Generalized anxiety disorder: Secondary | ICD-10-CM | POA: Diagnosis not present

## 2016-12-19 DIAGNOSIS — Z79899 Other long term (current) drug therapy: Secondary | ICD-10-CM | POA: Diagnosis not present

## 2016-12-19 DIAGNOSIS — F258 Other schizoaffective disorders: Secondary | ICD-10-CM

## 2016-12-19 DIAGNOSIS — F259 Schizoaffective disorder, unspecified: Secondary | ICD-10-CM

## 2016-12-19 DIAGNOSIS — Z818 Family history of other mental and behavioral disorders: Secondary | ICD-10-CM | POA: Diagnosis not present

## 2016-12-19 MED ORDER — FLUOXETINE HCL 20 MG PO CAPS: ORAL_CAPSULE | ORAL | 2 refills | Status: DC

## 2016-12-19 MED ORDER — ARIPIPRAZOLE 10 MG PO TABS: 10.0000 mg | ORAL_TABLET | Freq: Every day | ORAL | 2 refills | Status: DC

## 2016-12-19 NOTE — Progress Notes (Signed)
Patient ID: GABRYEL FILES, male   DOB: 18-Apr-1966, 51 y.o.   MRN: [id number]   [facility] Follow-up Outpatient Visit  GRAYLON AMORY [birth year] [id number] 51 y.o. 12/19/2016 4:22 PM  Chief Complaint:  HPI Comments: Mr. Bannan is  a 51 y/o male with a past psychiatric history significant for Schizoaffective Disorder and Generalized anxiety disorder. The patient returns for psychiatric services for medication management.  Brief history as per previous notes" Patient started having paranoid delusions in 2009. He was feeling as the [place] are along with aliens and out against his country. His medications in the beginning was Risperdal  did not help and then it was changed to Saphris"  Patient continues to tolerate Abilify dose of 10 mg he did benefit better on Saphris but for insurance reasons and expense wise could not afford it  not paranoid.  Sleep reasonable Anxiety not worse No tremors  modifying factor: job  Labs reviewed including cholesterol better then 1-2 years ago.   Past Medical Family, Social History:  Past Medical History:  Diagnosis Date  . Cancer (HCC)    Skin  . Squamous cell skin cancer, ala nasi    Family History  Problem Relation Age of Onset  . Diabetes Mellitus II Brother   . Schizophrenia Maternal Uncle   . Alcohol abuse Neg Hx   . Anxiety disorder Neg Hx   . Bipolar disorder Neg Hx   . Dementia Neg Hx   . Depression Neg Hx   . Drug abuse Neg Hx   . Coronary artery disease Neg Hx    Social History   Social History  . Marital status: Single    Spouse name: N/A  . Number of children: N/A  . Years of education: N/A   Social History Main Topics  . Smoking status: Former Smoker    Packs/day: 1.00    Types: Cigarettes, Cigars    Quit date: 10/30/2008  . Smokeless tobacco: Never Used  . Alcohol use 3.6 - 4.8 oz/week    6 - 8 Cans of beer per week  . Drug use: No  . Sexual activity: No   Other Topics Concern  . None   Social  History Narrative  . None    Outpatient Encounter Prescriptions as of 12/19/2016  Medication Sig  . ARIPiprazole (ABILIFY) 10 MG tablet Take 1 tablet (10 mg total) by mouth daily.  Marland Kitchen FLUoxetine (PROZAC) 20 MG capsule TAKE 3 CAPSULES (60 MG TOTAL) BY MOUTH DAILY.  . [DISCONTINUED] ARIPiprazole (ABILIFY) 10 MG tablet Take 1 tablet (10 mg total) by mouth daily.  . [DISCONTINUED] FLUoxetine (PROZAC) 20 MG capsule TAKE 3 CAPSULES (60 MG TOTAL) BY MOUTH DAILY.  . [DISCONTINUED] benztropine (COGENTIN) 1 MG tablet Take 1 tablet (1 mg total) by mouth as needed for tremors. (Patient not taking: Reported on 10/24/2016)   No facility-administered encounter medications on file as of 12/19/2016.      Review of Systems  Cardiovascular: Negative for chest pain and palpitations.  Skin: Negative for rash.  Neurological: Negative for tingling and tremors.  Psychiatric/Behavioral: Negative for hallucinations and suicidal ideas. The patient is not nervous/anxious.    Vitals:   12/19/16 1608  BP: 114/70  Pulse: 86  Resp: 16  SpO2: 96%  Weight: 203 lb (92.1 kg)  Height: 5\' 11"  (1.803 m)     Psychiatric Specialty Exam: General Appearance: Casual and Well Groomed  Eye Contact::  Fair  Speech:  Clear and Coherent and Normal Rate  Volume:  Normal  Mood:  improved  Affect:  Fair and reactive   Thought Process:  Coherent, Linear and Logical  Orientation:  Full (Time, Place, and Person)  Thought Content:  WDL  Suicidal Thoughts:  No  Homicidal Thoughts:  No  Memory:  Immediate;   Good Recent;   Good Remote;   Good  Judgement:  Fair  Insight:  Fair  Psychomotor Activity:  Normal  Concentration:  Good  Recall:  Negative  Akathisia:  Negative  [place]:  Right  AIMS (if indicated):   As noted in chart.  Assets:  Communication Skills Desire for Improvement Financial Resources/Insurance Intimacy Leisure Time Physical Health Resilience Social  Support Talents/Skills     Assessment: Schizoaffective Disorder  Axis I: Schizoaffective Disorder. GAD. Weight change  Plan:  1. Schizoaffective disorder: not worsened. Continue abilify 10mg .   2. GAD: fair not worse. Continue prozac 73m Weight maintenance discussed, reviewed side effects and provided supportive therapy. Prescriptions sent for 3-4 months  Takes cogentin prn for tremors. No refill needed     12/19/2016 4:22 PM

## 2016-12-20 ENCOUNTER — Other Ambulatory Visit (HOSPITAL_COMMUNITY): Payer: Self-pay | Admitting: Psychiatry

## 2017-01-03 NOTE — Telephone Encounter (Signed)
Received fax from [facility] requesting a refill for Prozac. Per Dr. De Nurse, refill request is denied. Rx was sent to pharmacy on 12/19/16 w/ 2 refills. Pt's next apt is schedule on 04/10/17. Nothing further is needed at this time.

## 2017-03-21 ENCOUNTER — Other Ambulatory Visit (HOSPITAL_COMMUNITY): Payer: Self-pay | Admitting: Psychiatry

## 2017-03-21 NOTE — Telephone Encounter (Signed)
Medication refill- received fax from [facility] requesting a refill for Abilify. Per Dr. De Nurse, refill request is authorize for Abilify 10mg , #30. Rx was sent to pharmacy. Pt's next apt is schedule on 04/10/17. Called and informed  pt of refill status. Pt shows understanding.

## 2017-04-10 ENCOUNTER — Ambulatory Visit (INDEPENDENT_AMBULATORY_CARE_PROVIDER_SITE_OTHER): Payer: BC Managed Care – PPO | Admitting: Psychiatry

## 2017-04-10 ENCOUNTER — Encounter (HOSPITAL_COMMUNITY): Payer: Self-pay | Admitting: Psychiatry

## 2017-04-10 VITALS — BP 124/76 | HR 94 | Resp 16 | Ht 71.0 in | Wt 209.0 lb

## 2017-04-10 DIAGNOSIS — F411 Generalized anxiety disorder: Secondary | ICD-10-CM | POA: Diagnosis not present

## 2017-04-10 DIAGNOSIS — Z87891 Personal history of nicotine dependence: Secondary | ICD-10-CM

## 2017-04-10 DIAGNOSIS — F258 Other schizoaffective disorders: Secondary | ICD-10-CM

## 2017-04-10 DIAGNOSIS — F259 Schizoaffective disorder, unspecified: Secondary | ICD-10-CM

## 2017-04-10 DIAGNOSIS — Z818 Family history of other mental and behavioral disorders: Secondary | ICD-10-CM | POA: Diagnosis not present

## 2017-04-10 MED ORDER — ARIPIPRAZOLE 10 MG PO TABS: 10.0000 mg | ORAL_TABLET | Freq: Every day | ORAL | 2 refills | Status: DC

## 2017-04-10 MED ORDER — FLUOXETINE HCL 20 MG PO CAPS: ORAL_CAPSULE | ORAL | 2 refills | Status: DC

## 2017-04-10 NOTE — Progress Notes (Signed)
Patient ID: Ronald [name], male   DOB: 1965/12/13, 51 y.o.   MRN: [id number]   [name] Follow-up Outpatient Visit  Ronald [name] 1965/09/27 [id number] 51 y.o. 04/10/2017 4:28 PM  Chief Complaint:  HPI Comments: [name] is  a 51 y/o male with a past psychiatric history significant for Schizoaffective Disorder and Generalized anxiety disorder. The patient returns for psychiatric services for medication management.  Brief history as per previous notes" Patient started having paranoid delusions in 2009. He was feeling as the [place] are along with aliens and out against his country. His medications in the beginning was Risperdal  did not help and then it was changed to Saphris"  Patient is tolerating Abilify and no reported side effects says that he has had occasional twitching but he understands he can take Cogentin if needed he has that medication. Weight maintenance is reasonabl he understands he can get away so he keeps himself active  No significant paranoid anxiety is not worse modifying factors a job  No hallucinations as of now  Past Medical Family, Social History:  Past Medical History:  Diagnosis Date  . Cancer (HCC)    Skin  . Squamous cell skin cancer, ala nasi    Family History  Problem Relation Age of Onset  . Diabetes Mellitus II Brother   . Schizophrenia Maternal Uncle   . Alcohol abuse Neg Hx   . Anxiety disorder Neg Hx   . Bipolar disorder Neg Hx   . Dementia Neg Hx   . Depression Neg Hx   . Drug abuse Neg Hx   . Coronary artery disease Neg Hx    Social History   Social History  . Marital status: Single    Spouse name: N/A  . Number of children: N/A  . Years of education: N/A   Social History Main Topics  . Smoking status: Former Smoker    Packs/day: 1.00    Types: Cigarettes, Cigars    Quit date: 10/30/2008  . Smokeless tobacco: Never Used  . Alcohol use 3.6 - 4.8 oz/week    6 - 8 Cans of beer per week  . Drug use: No  .  Sexual activity: No   Other Topics Concern  . None   Social History Narrative  . None    Outpatient Encounter Prescriptions as of 04/10/2017  Medication Sig  . ARIPiprazole (ABILIFY) 10 MG tablet Take 1 tablet (10 mg total) by mouth daily.  Marland Kitchen FLUoxetine (PROZAC) 20 MG capsule TAKE 3 CAPSULES (60 MG TOTAL) BY MOUTH DAILY.  . [DISCONTINUED] ARIPiprazole (ABILIFY) 10 MG tablet TAKE 1 TABLET (10 MG TOTAL) BY MOUTH DAILY.  . [DISCONTINUED] FLUoxetine (PROZAC) 20 MG capsule TAKE 3 CAPSULES (60 MG TOTAL) BY MOUTH DAILY.   No facility-administered encounter medications on file as of 04/10/2017.      Review of Systems  Skin: Negative for itching.  Neurological: Negative for tremors.  Psychiatric/Behavioral: Negative for hallucinations and suicidal ideas. The patient is not nervous/anxious.    Vitals:   04/10/17 1618  BP: 124/76  Pulse: 94  Resp: 16  SpO2: 96%  Weight: 209 lb (94.8 kg)  Height: 5\' 11"  (1.803 m)     Psychiatric Specialty Exam: General Appearance: Casual and Well Groomed  Eye Contact::  Fair  Speech:  Clear and Coherent and Normal Rate  Volume:  Normal  Mood: fair  Affect:  congruent   Thought Process:  Coherent, Linear and Logical  Orientation:  Full (Time, Place, and  Person)  Thought Content:  WDL  Suicidal Thoughts:  No  Homicidal Thoughts:  No  Memory:  Immediate;   Good Recent;   Good Remote;   Good  Judgement:  Fair  Insight:  Fair  Psychomotor Activity:  Normal  Concentration:  Good  Recall:  Negative  Akathisia:  Negative  [place]:  Right  AIMS (if indicated):   As noted in chart.  Assets:  Communication Skills Desire for Improvement Financial Resources/Insurance Intimacy Leisure Time Physical Health Resilience Social Support Talents/Skills     Assessment: Schizoaffective Disorder  Axis I: Schizoaffective Disorder. GAD. Weight change  Plan:  1. Schizoaffective disorder: baseline or  stable. Continue abilify 10mg . Use cogentin if has tremors or side effects discussed.  2. GAD: balanced. Continue prozac  Reviewed sleep hygiene reviewed side effects and also weight maintenance   Follow-up in 4 months. Prescriptions renewed     04/10/2017 4:28 PM

## 2017-07-30 ENCOUNTER — Telehealth (HOSPITAL_COMMUNITY): Payer: Self-pay

## 2017-07-30 NOTE — Telephone Encounter (Signed)
[facility] sent over fax for a 90 day refill on Fluoxetine and Aripiprazole. Patient has not been seen since 04/10/17 and next office visit is 07/31/17. Sent fax back to pharmacy informing them that the patient will receive more medication at next visit on 07/31/17.

## 2017-07-31 ENCOUNTER — Ambulatory Visit (INDEPENDENT_AMBULATORY_CARE_PROVIDER_SITE_OTHER): Payer: BC Managed Care – PPO | Admitting: Psychiatry

## 2017-07-31 ENCOUNTER — Encounter (HOSPITAL_COMMUNITY): Payer: Self-pay | Admitting: Psychiatry

## 2017-07-31 VITALS — BP 110/84 | HR 88 | Ht 71.0 in | Wt 211.0 lb

## 2017-07-31 DIAGNOSIS — F411 Generalized anxiety disorder: Secondary | ICD-10-CM

## 2017-07-31 DIAGNOSIS — F258 Other schizoaffective disorders: Secondary | ICD-10-CM | POA: Diagnosis not present

## 2017-07-31 DIAGNOSIS — F259 Schizoaffective disorder, unspecified: Secondary | ICD-10-CM

## 2017-07-31 MED ORDER — FLUOXETINE HCL 20 MG PO CAPS: ORAL_CAPSULE | ORAL | 2 refills | Status: DC

## 2017-07-31 MED ORDER — ARIPIPRAZOLE 10 MG PO TABS: 10.0000 mg | ORAL_TABLET | Freq: Every day | ORAL | 2 refills | Status: DC

## 2017-07-31 NOTE — Progress Notes (Signed)
Patient ID: Ronald [name], male   DOB: 1966/01/29, 51 y.o.   MRN: [id number]   [facility] Follow-up Outpatient Visit  Ronald [name] 12-25-1965 [id number] 51 y.o. 07/31/2017 4:41 PM  Chief Complaint:  HPI Comments: Ronald [name] is  a 51 y/o male with a past psychiatric history significant for Schizoaffective Disorder and Generalized anxiety disorder. The patient returns for psychiatric services for medication management.  Brief history as per previous notes" Patient started having paranoid delusions in 2009"  saphris has helped in past.   Tolerating abilify.  No significant parnoia, no side effects  sleep well prozac helps depression  Modifying factor: meds,   Past Medical Family, Social History:  Past Medical History:  Diagnosis Date  . Cancer (HCC)    Skin  . Squamous cell skin cancer, ala nasi    Family History  Problem Relation Age of Onset  . Diabetes Mellitus II Brother   . Schizophrenia Maternal Uncle   . Alcohol abuse Neg Hx   . Anxiety disorder Neg Hx   . Bipolar disorder Neg Hx   . Dementia Neg Hx   . Depression Neg Hx   . Drug abuse Neg Hx   . Coronary artery disease Neg Hx    Social History   Socioeconomic History  . Marital status: Single    Spouse name: None  . Number of children: None  . Years of education: None  . Highest education level: None  Social Needs  . Financial resource strain: None  . Food insecurity - worry: None  . Food insecurity - inability: None  . Transportation needs - medical: None  . Transportation needs - non-medical: None  Occupational History  . None  Tobacco Use  . Smoking status: Former Smoker    Packs/day: 1.00    Types: Cigarettes, Cigars    Last attempt to quit: 10/30/2008    Years since quitting: 8.7  . Smokeless tobacco: Never Used  Substance and Sexual Activity  . Alcohol use: Yes    Alcohol/week: 3.6 - 4.8 oz    Types: 6 - 8 Cans of beer per week  . Drug use: No  . Sexual activity: No   Other Topics Concern  . None  Social History Narrative  . None    Outpatient Encounter Medications as of 07/31/2017  Medication Sig  . ARIPiprazole (ABILIFY) 10 MG tablet Take 1 tablet (10 mg total) by mouth daily.  Marland Kitchen FLUoxetine (PROZAC) 20 MG capsule TAKE 3 CAPSULES (60 MG TOTAL) BY MOUTH DAILY.  . [DISCONTINUED] ARIPiprazole (ABILIFY) 10 MG tablet Take 1 tablet (10 mg total) by mouth daily.  . [DISCONTINUED] FLUoxetine (PROZAC) 20 MG capsule TAKE 3 CAPSULES (60 MG TOTAL) BY MOUTH DAILY.   No facility-administered encounter medications on file as of 07/31/2017.      Review of Systems  Cardiovascular: Negative for chest pain.  Skin: Negative for itching.  Neurological: Negative for tremors.  Psychiatric/Behavioral: Negative for depression, hallucinations and suicidal ideas. The patient is not nervous/anxious.    Vitals:   07/31/17 1635  BP: 110/84  Pulse: 88  Weight: 211 lb (95.7 kg)  Height: 5\' 11"  (1.803 m)     Psychiatric Specialty Exam: General Appearance: Casual and Well Groomed  Eye Contact::  Fair  Speech:  Clear and Coherent and Normal Rate  Volume:  Normal  Mood: fair   Affect:  congruent pleasant  Thought Process:  Coherent, Linear and Logical  Orientation:  Full (Time, Place, and Person)  Thought Content:  WDL  Suicidal Thoughts:  No  Homicidal Thoughts:  No  Memory:  Immediate;   Good Recent;   Good Remote;   Good  Judgement:  Fair  Insight:  Fair  Psychomotor Activity:  Normal  Concentration:  Good  Recall:  Negative  Akathisia:  Negative  Language-Intact  Fund of Knowledge-Average  Handed:  Right  AIMS (if indicated):   As noted in chart.  Assets:  Communication Skills Desire for Improvement Financial Resources/Insurance Intimacy Leisure Time Physical Health Resilience Social Support Talents/Skills     Assessment: Schizoaffective Disorder  Axis I: Schizoaffective Disorder. GAD. Weight change  Plan:  1. Schizoaffective  disorder: stable continue abilify and prozac  2. GAD: doing fair. Continue prozac   reveiwed questions and renewed meds. FU 3 months    07/31/2017 4:41 PM

## 2017-09-03 ENCOUNTER — Other Ambulatory Visit (HOSPITAL_COMMUNITY): Payer: Self-pay

## 2017-09-03 MED ORDER — ARIPIPRAZOLE 10 MG PO TABS: 10.0000 mg | ORAL_TABLET | Freq: Every day | ORAL | 0 refills | Status: DC

## 2017-10-02 ENCOUNTER — Telehealth (HOSPITAL_COMMUNITY): Payer: Self-pay | Admitting: Psychiatry

## 2017-10-02 NOTE — Telephone Encounter (Signed)
Informed patient per Dr. De Nurse that it is ok to take the vitamin b complex. I also informed him that Dr. De Nurse stated he was not for sure if the vitamin b would have a direct effect on energy. Patient stated his understanding. Nothing further is needed at this time.

## 2017-10-02 NOTE — Telephone Encounter (Signed)
Its ok to take but not sure if they would have direct effect on energy

## 2017-10-02 NOTE — Telephone Encounter (Signed)
Pt is looking for more energy. He would like to know if it is ok for him to take a vitamin b complex?   cb [phone]

## 2017-10-04 ENCOUNTER — Ambulatory Visit: Payer: BC Managed Care – PPO | Admitting: Family Medicine

## 2017-10-04 ENCOUNTER — Encounter: Payer: Self-pay | Admitting: Family Medicine

## 2017-10-04 VITALS — BP 126/65 | HR 96 | Temp 98.5°F | Ht 71.0 in | Wt 212.0 lb

## 2017-10-04 DIAGNOSIS — Z23 Encounter for immunization: Secondary | ICD-10-CM | POA: Diagnosis not present

## 2017-10-04 DIAGNOSIS — L989 Disorder of the skin and subcutaneous tissue, unspecified: Secondary | ICD-10-CM

## 2017-10-04 DIAGNOSIS — K148 Other diseases of tongue: Secondary | ICD-10-CM | POA: Diagnosis not present

## 2017-10-04 MED ORDER — TRIAMCINOLONE ACETONIDE 0.1 % MT PSTE: 1.0000 "application " | PASTE | Freq: Two times a day (BID) | OROMUCOSAL | 0 refills | Status: DC

## 2017-10-04 NOTE — Progress Notes (Signed)
Subjective:    Patient ID: Ronald [name], male    DOB: [birth year], 52 y.o.   MRN: [id number]  HPI Patient comes in with a sore on the right side of his tongue that is been present for about 2 weeks.  He normally buys a sugar-free type of peppermint candy but accidentally about the regular sugar version.  He thinks that may have triggered this.  He quit eating the candy but the lesion still has not gone away.  He also wants me to look at a skin lesion that he is noticed on his upper back.  He has had something removed previously, and nodular basal cell carcinoma.  The new lesion has not been bothersome irritating or bleeding but he just wanted me to take a look at it.   Review of Systems BP 126/65   Pulse 96   Temp 98.5 F (36.9 C)   Ht 5\' 11"  (1.803 m)   Wt 212 lb (96.2 kg)   SpO2 97%   BMI 29.57 kg/m     Allergies  Allergen Reactions  . Bee Venom Swelling  . Penicillins     Past Medical History:  Diagnosis Date  . Cancer (HCC)    Skin  . Squamous cell skin cancer, ala nasi     Past Surgical History:  Procedure Laterality Date  . SKIN BIOPSY      Social History   Socioeconomic History  . Marital status: Single    Spouse name: Not on file  . Number of children: Not on file  . Years of education: Not on file  . Highest education level: Not on file  Social Needs  . Financial resource strain: Not on file  . Food insecurity - worry: Not on file  . Food insecurity - inability: Not on file  . Transportation needs - medical: Not on file  . Transportation needs - non-medical: Not on file  Occupational History  . Not on file  Tobacco Use  . Smoking status: Former Smoker    Packs/day: 1.00    Types: Cigarettes, Cigars    Last attempt to quit: 10/30/2008    Years since quitting: 8.9  . Smokeless tobacco: Never Used  Substance and Sexual Activity  . Alcohol use: Yes    Alcohol/week: 3.6 - 4.8 oz    Types: 6 - 8 Cans of beer per week  . Drug use: No  . Sexual  activity: No  Other Topics Concern  . Not on file  Social History Narrative  . Not on file    Family History  Problem Relation Age of Onset  . Diabetes Mellitus II Brother   . Schizophrenia Maternal Uncle   . Alcohol abuse Neg Hx   . Anxiety disorder Neg Hx   . Bipolar disorder Neg Hx   . Dementia Neg Hx   . Depression Neg Hx   . Drug abuse Neg Hx   . Coronary artery disease Neg Hx     Outpatient Encounter Medications as of 10/04/2017  Medication Sig  . ARIPiprazole (ABILIFY) 10 MG tablet Take 1 tablet (10 mg total) by mouth daily.  Marland Kitchen FLUoxetine (PROZAC) 20 MG capsule TAKE 3 CAPSULES (60 MG TOTAL) BY MOUTH DAILY.  Marland Kitchen triamcinolone (KENALOG) 0.1 % paste Use as directed 1 application in the mouth or throat 2 (two) times daily.   No facility-administered encounter medications on file as of 10/04/2017.          Objective:   Physical Exam  Constitutional: He is oriented to person, place, and time. He appears well-developed and well-nourished.  HENT:  Head: Normocephalic and atraumatic.  Small approx 3-4 mm white lesion on the right lateral side of the tongue.   Eyes: Conjunctivae and EOM are normal.  Cardiovascular: Normal rate.  Pulmonary/Chest: Effort normal.  Neurological: He is alert and oriented to person, place, and time.  Skin: Skin is dry. No pallor.  He has a small maybe 2-3 mm pick raise papule lesion on right upper back near the shoulder.   Psychiatric: He has a normal mood and affect. His behavior is normal.  Vitals reviewed.           Assessment & Plan:  Tongue lesion-looks consistent with an ulcer based on exam but there is no surrounding erythema at the edge which is a little unusual.  I did go ahead and refer him to ENT for further biopsy and evaluation in the meantime we will try a steroid oral paste and see if it responds to this.  Avoid triggers such as acidic foods.  Skin lesion-lesion seen above.  Measures approximately 2 mm and is round and  smooth.  It may just be a nevus but I did go ahead and take a photograph today so that we could follow this again in 3-6 months.  Encouraged him to take a picture with his own phone since it is on his back and it is difficult to tell.

## 2017-10-05 ENCOUNTER — Encounter: Payer: Self-pay | Admitting: Family Medicine

## 2017-10-11 ENCOUNTER — Encounter: Payer: Self-pay | Admitting: Family Medicine

## 2017-10-11 ENCOUNTER — Ambulatory Visit (INDEPENDENT_AMBULATORY_CARE_PROVIDER_SITE_OTHER): Payer: BC Managed Care – PPO | Admitting: Family Medicine

## 2017-10-11 VITALS — BP 116/75 | HR 103 | Temp 100.6°F | Ht 70.98 in | Wt 210.0 lb

## 2017-10-11 DIAGNOSIS — R509 Fever, unspecified: Secondary | ICD-10-CM | POA: Diagnosis not present

## 2017-10-11 DIAGNOSIS — J029 Acute pharyngitis, unspecified: Secondary | ICD-10-CM | POA: Diagnosis not present

## 2017-10-11 DIAGNOSIS — K148 Other diseases of tongue: Secondary | ICD-10-CM | POA: Diagnosis not present

## 2017-10-11 DIAGNOSIS — J101 Influenza due to other identified influenza virus with other respiratory manifestations: Secondary | ICD-10-CM

## 2017-10-11 LAB — POCT RAPID STREP A (OFFICE): Rapid Strep A Screen: NEGATIVE

## 2017-10-11 LAB — POCT INFLUENZA A/B
INFLUENZA A, POC: POSITIVE — AB
Influenza B, POC: NEGATIVE

## 2017-10-11 MED ORDER — OSELTAMIVIR PHOSPHATE 75 MG PO CAPS: 75.0000 mg | ORAL_CAPSULE | Freq: Two times a day (BID) | ORAL | 0 refills | Status: DC

## 2017-10-11 NOTE — Progress Notes (Signed)
ta  Subjective:    Patient ID: Ronald [name], male    DOB: [birth year], 51 y.o.   MRN: [id number]  HPI 52 year old male comes in today complaining of sore throat and slightly dry cough.  He was seen about a week ago for an ulcer on the right side of his tongue that had been there for a while.  We decided to treat it with some triamcinolone paste.  He does feel like it helps some but it just has not completely resolved.  Though he admits he is really only been using it once a day.  He was concerned that he could be getting some new ulcers possibly in his throat.  He said the sore throat started about 2 days ago.  Now he has developed a fever.  He has had a little bit of a runny nose. No nasal congestion or cough.    Review of Systems  BP 116/75   Pulse (!) 103   Temp (!) 100.6 F (38.1 C)   Ht 5' 10.98" (1.803 m)   Wt 210 lb (95.3 kg)   SpO2 95%   BMI 29.30 kg/m     Allergies  Allergen Reactions  . Bee Venom Swelling  . Penicillins     Past Medical History:  Diagnosis Date  . Cancer (HCC)    Skin  . Squamous cell skin cancer, ala nasi     Past Surgical History:  Procedure Laterality Date  . SKIN BIOPSY      Social History   Socioeconomic History  . Marital status: Single    Spouse name: Not on file  . Number of children: Not on file  . Years of education: Not on file  . Highest education level: Not on file  Social Needs  . Financial resource strain: Not on file  . Food insecurity - worry: Not on file  . Food insecurity - inability: Not on file  . Transportation needs - medical: Not on file  . Transportation needs - non-medical: Not on file  Occupational History  . Not on file  Tobacco Use  . Smoking status: Former Smoker    Packs/day: 1.00    Types: Cigarettes, Cigars    Last attempt to quit: 10/30/2008    Years since quitting: 8.9  . Smokeless tobacco: Never Used  Substance and Sexual Activity  . Alcohol use: Yes    Alcohol/week: 3.6 - 4.8 oz    Types:  6 - 8 Cans of beer per week  . Drug use: No  . Sexual activity: No  Other Topics Concern  . Not on file  Social History Narrative  . Not on file    Family History  Problem Relation Age of Onset  . Diabetes Mellitus II Brother   . Schizophrenia Maternal Uncle   . Alcohol abuse Neg Hx   . Anxiety disorder Neg Hx   . Bipolar disorder Neg Hx   . Dementia Neg Hx   . Depression Neg Hx   . Drug abuse Neg Hx   . Coronary artery disease Neg Hx     Outpatient Encounter Medications as of 10/11/2017  Medication Sig  . ARIPiprazole (ABILIFY) 10 MG tablet Take 1 tablet (10 mg total) by mouth daily.  Marland Kitchen FLUoxetine (PROZAC) 20 MG capsule TAKE 3 CAPSULES (60 MG TOTAL) BY MOUTH DAILY.  Marland Kitchen oseltamivir (TAMIFLU) 75 MG capsule Take 1 capsule (75 mg total) by mouth 2 (two) times daily.  Marland Kitchen triamcinolone (KENALOG) 0.1 % paste Use  as directed 1 application in the mouth or throat 2 (two) times daily.  . [DISCONTINUED] asenapine (SAPHRIS) 5 MG SUBL 24 hr tablet ONE TWICE A DAY (Patient not taking: Reported on 08/17/2016)   No facility-administered encounter medications on file as of 10/11/2017.           Objective:   Physical Exam  Constitutional: He is oriented to person, place, and time. He appears well-developed and well-nourished.  HENT:  Head: Normocephalic and atraumatic.  Right Ear: External ear normal.  Left Ear: External ear normal.  Nose: Nose normal.  Mouth/Throat: Oropharynx is clear and moist.  TMs and canals are clear.   Eyes: Conjunctivae and EOM are normal. Pupils are equal, round, and reactive to light.  Neck: Neck supple. No thyromegaly present.  Cardiovascular: Normal rate and normal heart sounds.  Pulmonary/Chest: Effort normal and breath sounds normal.  Lymphadenopathy:    He has no cervical adenopathy.  Neurological: He is alert and oriented to person, place, and time.  Skin: Skin is warm and dry.  Psychiatric: He has a normal mood and affect.        Assessment &  Plan:  Tongue ulcer-overall I do feel like it looks a little smaller than it did last week.  He does remind him to use his paste twice a day.  We will go ahead and work on a referral to the oral surgeon for possible biopsy.  If it completely resolves by then then he can cancel the appointment.  Fever- Given 600mg  Ibuprofen here in the office at 2:48 PM   Influenza A - OK for [place].  Continue symptom medic care.  If not significantly better by Monday then please give [name] a call back.  Do not return to work until fever free for 24 hours.

## 2017-10-11 NOTE — Patient Instructions (Addendum)
Given 600mg  Ibuprofen here in the office at 2:48 PM      Influenza, Adult Influenza ("the flu") is an infection in the lungs, nose, and throat (respiratory tract). It is caused by a virus. The flu causes many common cold symptoms, as well as a high fever and body aches. It can make you feel very sick. The flu spreads easily from person to person (is contagious). Getting a flu shot (influenza vaccination) every year is the best way to prevent the flu. Follow these instructions at home:  Take over-the-counter and prescription medicines only as told by your doctor.  Use a cool mist humidifier to add moisture (humidity) to the air in your home. This can make it easier to breathe.  Rest as needed.  Drink enough fluid to keep your pee (urine) clear or pale yellow.  Cover your mouth and nose when you cough or sneeze.  Wash your hands with soap and water often, especially after you cough or sneeze. If you cannot use soap and water, use hand sanitizer.  Stay home from work or school as told by your doctor. Unless you are visiting your doctor, try to avoid leaving home until your fever has been gone for 24 hours without the use of medicine.  Keep all follow-up visits as told by your doctor. This is important. How is this prevented?  Getting a yearly (annual) flu shot is the best way to avoid getting the flu. You may get the flu shot in late summer, fall, or winter. Ask your doctor when you should get your flu shot.  Wash your hands often or use hand sanitizer often.  Avoid contact with people who are sick during cold and flu season.  Eat healthy foods.  Drink plenty of fluids.  Get enough sleep.  Exercise regularly. Contact a doctor if:  You get new symptoms.  You have: ? Chest pain. ? Watery poop (diarrhea). ? A fever.  Your cough gets worse.  You start to have more mucus.  You feel sick to your stomach (nauseous).  You throw up (vomit). Get help right away if:  You  start to be short of breath or have trouble breathing.  Your skin or nails turn a bluish color.  You have very bad pain or stiffness in your neck.  You get a sudden headache.  You get sudden pain in your face or ear.  You cannot stop throwing up. This information is not intended to replace advice given to you by your health care provider. Make sure you discuss any questions you have with your health care provider. Document Released: 06/06/2008 Document Revised: 02/03/2016 Document Reviewed: 06/22/2015 Elsevier Interactive Patient Education  2017 [employer].

## 2017-10-17 ENCOUNTER — Other Ambulatory Visit (HOSPITAL_COMMUNITY): Payer: Self-pay | Admitting: Psychiatry

## 2017-10-17 NOTE — Telephone Encounter (Signed)
Pt needs refill on prozac sent to [facility]

## 2017-10-18 MED ORDER — FLUOXETINE HCL 20 MG PO CAPS: ORAL_CAPSULE | ORAL | 0 refills | Status: DC

## 2017-10-18 NOTE — Telephone Encounter (Signed)
Per Dr. De Nurse, Prozac 20 mg refill was sent to the pharmacy for a 30 day supply. Called to inform patient. Nothing further is needed at this time.

## 2017-10-18 NOTE — Telephone Encounter (Signed)
Can send 

## 2017-10-25 ENCOUNTER — Other Ambulatory Visit: Payer: Self-pay

## 2017-10-25 ENCOUNTER — Encounter (HOSPITAL_COMMUNITY): Payer: Self-pay | Admitting: Psychiatry

## 2017-10-25 ENCOUNTER — Ambulatory Visit (INDEPENDENT_AMBULATORY_CARE_PROVIDER_SITE_OTHER): Payer: BC Managed Care – PPO | Admitting: Psychiatry

## 2017-10-25 VITALS — BP 130/88 | HR 89 | Ht 71.0 in | Wt 214.0 lb

## 2017-10-25 DIAGNOSIS — F258 Other schizoaffective disorders: Principal | ICD-10-CM

## 2017-10-25 DIAGNOSIS — Z87891 Personal history of nicotine dependence: Secondary | ICD-10-CM | POA: Diagnosis not present

## 2017-10-25 DIAGNOSIS — Z818 Family history of other mental and behavioral disorders: Secondary | ICD-10-CM | POA: Diagnosis not present

## 2017-10-25 DIAGNOSIS — F259 Schizoaffective disorder, unspecified: Secondary | ICD-10-CM | POA: Diagnosis not present

## 2017-10-25 DIAGNOSIS — Z79899 Other long term (current) drug therapy: Secondary | ICD-10-CM

## 2017-10-25 DIAGNOSIS — F411 Generalized anxiety disorder: Secondary | ICD-10-CM | POA: Diagnosis not present

## 2017-10-25 MED ORDER — FLUOXETINE HCL 20 MG PO CAPS: ORAL_CAPSULE | ORAL | 0 refills | Status: DC

## 2017-10-25 NOTE — Progress Notes (Signed)
Patient ID: BORA BRONER, male   DOB: 10/11/1965, 52 y.o.   MRN: [id number]   [facility] Follow-up Outpatient Visit  KOLLEN ARMENTI 1966-04-30 [id number] 52 y.o. 10/25/2017 4:39 PM  Chief Complaint:  HPI Comments: Mr. Maselli is  a 52 y/o male with a past psychiatric history significant for Schizoaffective Disorder and Generalized anxiety disorder. The patient returns for psychiatric services for medication management.  Brief history as per previous notes" Patient started having paranoid delusions in 2009"  saphris has helped in past.   Tolerating abilify. But feeling tired, takes it in the am  No significant parnoia,  sleep fair prozac helps depression Planning to start another job  Modifying factor: meds,   Past Medical Family, Social History:  Past Medical History:  Diagnosis Date  . Cancer (HCC)    Skin  . Squamous cell skin cancer, ala nasi    Family History  Problem Relation Age of Onset  . Diabetes Mellitus II Brother   . Schizophrenia Maternal Uncle   . Alcohol abuse Neg Hx   . Anxiety disorder Neg Hx   . Bipolar disorder Neg Hx   . Dementia Neg Hx   . Depression Neg Hx   . Drug abuse Neg Hx   . Coronary artery disease Neg Hx    Social History   Socioeconomic History  . Marital status: Single    Spouse name: None  . Number of children: None  . Years of education: None  . Highest education level: None  Social Needs  . Financial resource strain: None  . Food insecurity - worry: None  . Food insecurity - inability: None  . Transportation needs - medical: None  . Transportation needs - non-medical: None  Occupational History  . None  Tobacco Use  . Smoking status: Former Smoker    Packs/day: 1.00    Types: Cigarettes, Cigars    Last attempt to quit: 10/30/2008    Years since quitting: 8.9  . Smokeless tobacco: Never Used  Substance and Sexual Activity  . Alcohol use: Yes    Alcohol/week: 3.6 - 4.8 oz    Types: 6 - 8 Cans of beer  per week  . Drug use: No  . Sexual activity: No  Other Topics Concern  . None  Social History Narrative  . None    Outpatient Encounter Medications as of 10/25/2017  Medication Sig  . ARIPiprazole (ABILIFY) 10 MG tablet Take 1 tablet (10 mg total) by mouth daily.  Marland Kitchen FLUoxetine (PROZAC) 20 MG capsule TAKE 3 CAPSULES (60 MG TOTAL) BY MOUTH DAILY.  . [DISCONTINUED] FLUoxetine (PROZAC) 20 MG capsule TAKE 3 CAPSULES (60 MG TOTAL) BY MOUTH DAILY.  Marland Kitchen oseltamivir (TAMIFLU) 75 MG capsule Take 1 capsule (75 mg total) by mouth 2 (two) times daily. (Patient not taking: Reported on 10/25/2017)  . triamcinolone (KENALOG) 0.1 % paste Use as directed 1 application in the mouth or throat 2 (two) times daily. (Patient not taking: Reported on 10/25/2017)  . [DISCONTINUED] asenapine (SAPHRIS) 5 MG SUBL 24 hr tablet ONE TWICE A DAY (Patient not taking: Reported on 08/17/2016)   No facility-administered encounter medications on file as of 10/25/2017.      Review of Systems  Cardiovascular: Negative for palpitations.  Skin: Negative for itching.  Neurological: Negative for tremors.  Psychiatric/Behavioral: Negative for depression, hallucinations and suicidal ideas. The patient is not nervous/anxious.    Vitals:   10/25/17 1625  BP: 130/88  Pulse: 89  Weight: 214 lb (  97.1 kg)  Height: 5\' 11"  (1.803 m)     Psychiatric Specialty Exam: General Appearance: Casual and Well Groomed  Eye Contact::  Fair  Speech:  Clear and Coherent and Normal Rate  Volume:  Normal  Mood: fair  Affect:  congruent  Thought Process:  Coherent, Linear and Logical  Orientation:  Full (Time, Place, and Person)  Thought Content:  WDL  Suicidal Thoughts:  No  Homicidal Thoughts:  No  Memory:  Immediate;   Good Recent;   Good Remote;   Good  Judgement:  Fair  Insight:  Fair  Psychomotor Activity:  Normal  Concentration:  Good  Recall:  Negative  Akathisia:  Negative  Language-Intact  Fund of Knowledge-Average  Handed:   Right  AIMS (if indicated):   As noted in chart.  Assets:  Communication Skills Desire for Improvement Financial Resources/Insurance Intimacy Leisure Time Physical Health Resilience Social Support Talents/Skills     Assessment: Schizoaffective Disorder  Axis I: Schizoaffective Disorder. GAD. Weight change  Plan:  1. Schizoaffective disorder: fair. Continue abilify. Change timing to afternoon or evening if feeling tired  not tremors  2. GAD:not worse. Continue prozac    reveiwed questions and renewed meds. FU 3 months    10/25/2017 4:39 PM

## 2017-11-21 ENCOUNTER — Telehealth (HOSPITAL_COMMUNITY): Payer: Self-pay

## 2017-11-21 MED ORDER — FLUOXETINE HCL 20 MG PO CAPS: ORAL_CAPSULE | ORAL | 0 refills | Status: DC

## 2017-11-21 NOTE — Telephone Encounter (Signed)
Pharmacy sent fax requesting a 90 day supply of Fluoxetine 20 mg. Sent over a refill for 90 days per Dr. De Nurse. Patients' next ov is on 01/17/18. Nothing further is needed at this time.

## 2017-11-30 ENCOUNTER — Ambulatory Visit (INDEPENDENT_AMBULATORY_CARE_PROVIDER_SITE_OTHER): Payer: BC Managed Care – PPO | Admitting: Family Medicine

## 2017-11-30 ENCOUNTER — Encounter: Payer: Self-pay | Admitting: Family Medicine

## 2017-11-30 VITALS — BP 128/69 | HR 89 | Ht 71.0 in | Wt 215.0 lb

## 2017-11-30 DIAGNOSIS — K2100 Gastro-esophageal reflux disease with esophagitis, without bleeding: Secondary | ICD-10-CM

## 2017-11-30 DIAGNOSIS — R112 Nausea with vomiting, unspecified: Secondary | ICD-10-CM | POA: Diagnosis not present

## 2017-11-30 MED ORDER — LANSOPRAZOLE 30 MG PO CPDR: 30.0000 mg | DELAYED_RELEASE_CAPSULE | Freq: Every day | ORAL | 1 refills | Status: DC

## 2017-11-30 NOTE — Patient Instructions (Addendum)
Take the pantoprazole at bedtime nightly.  If you are not feeling significant improvement in your symptoms over the next 2-3 weeks then please give [name] a call.  If it is helping then the plan will be to continue the course of medication for 6 weeks and then taper to every other day for a couple of weeks and then stop.   Food Choices for Gastroesophageal Reflux Disease, Adult When you have gastroesophageal reflux disease (GERD), the foods you eat and your eating habits are very important. Choosing the right foods can help ease your discomfort. What guidelines do I need to follow?  Choose fruits, vegetables, whole grains, and low-fat dairy products.  Choose low-fat meat, fish, and poultry.  Limit fats such as oils, salad dressings, butter, nuts, and avocado.  Keep a food diary. This helps you identify foods that cause symptoms.  Avoid foods that cause symptoms. These may be different for everyone.  Eat small meals often instead of 3 large meals a day.  Eat your meals slowly, in a place where you are relaxed.  Limit fried foods.  Cook foods using methods other than frying.  Avoid drinking alcohol.  Avoid drinking large amounts of liquids with your meals.  Avoid bending over or lying down until 2-3 hours after eating. What foods are not recommended? These are some foods and drinks that may make your symptoms worse: Vegetables Tomatoes. Tomato juice. Tomato and spaghetti sauce. Chili peppers. Onion and garlic. Horseradish. Fruits Oranges, grapefruit, and lemon (fruit and juice). Meats High-fat meats, fish, and poultry. This includes hot dogs, ribs, ham, sausage, salami, and bacon. Dairy Whole milk and chocolate milk. Sour cream. Cream. Butter. Ice cream. Cream cheese. Drinks Coffee and tea. Bubbly (carbonated) drinks or energy drinks. Condiments Hot sauce. Barbecue sauce. Sweets/Desserts Chocolate and cocoa. Donuts. Peppermint and spearmint. Fats and Oils High-fat foods. This  includes [place] fries and potato chips. Other Vinegar. Strong spices. This includes black pepper, white pepper, red pepper, cayenne, curry powder, cloves, ginger, and chili powder. The items listed above may not be a complete list of foods and drinks to avoid. Contact your dietitian for more information. This information is not intended to replace advice given to you by your health care provider. Make sure you discuss any questions you have with your health care provider. Document Released: 02/27/2012 Document Revised: 02/03/2016 Document Reviewed: 07/02/2013 Elsevier Interactive Patient Education  2017 [employer].

## 2017-11-30 NOTE — Progress Notes (Signed)
Subjective:    Patient ID: Ronald [name], male    DOB: 1966-01-07, 52 y.o.   MRN: [id number]  HPI For 1-2 years he will occ wake up feeling nauseated.  He says in particular if he sleeps on his right side start to get uncomfortable with some nausea.  So typically he will try to sleep on his left side because it does not seem to occur nearly as often.  A couple times a year he will wake up nauseated and actually vomit.  He reports that he wakes up frequently with heartburn and occasionally brash.  He does experience some indigestion intermittently throughout the day.  But no significant abdominal pain.  And no abdominal pain immediately after eating.  He denies any change in stools including constipation or diarrhea.  He denies any blood in the stool.  He still has his gallbladder.   Last colonoscopy was in his 9s.  He said he had a couple benign polyps at the time.   Review of Systems   BP 128/69   Pulse 89   Ht 5\' 11"  (1.803 m)   Wt 215 lb (97.5 kg)   SpO2 98%   BMI 29.99 kg/m     Allergies  Allergen Reactions  . Bee Venom Swelling  . Penicillins     Past Medical History:  Diagnosis Date  . Cancer (HCC)    Skin  . Squamous cell skin cancer, ala nasi     Past Surgical History:  Procedure Laterality Date  . SKIN BIOPSY      Social History   Socioeconomic History  . Marital status: Single    Spouse name: Not on file  . Number of children: Not on file  . Years of education: Not on file  . Highest education level: Not on file  Occupational History  . Not on file  Social Needs  . Financial resource strain: Not on file  . Food insecurity:    Worry: Not on file    Inability: Not on file  . Transportation needs:    Medical: Not on file    Non-medical: Not on file  Tobacco Use  . Smoking status: Former Smoker    Packs/day: 1.00    Types: Cigarettes, Cigars    Last attempt to quit: 10/30/2008    Years since quitting: 9.0  . Smokeless tobacco: Never Used   Substance and Sexual Activity  . Alcohol use: Yes    Alcohol/week: 3.6 - 4.8 oz    Types: 6 - 8 Cans of beer per week  . Drug use: No  . Sexual activity: Never  Lifestyle  . Physical activity:    Days per week: Not on file    Minutes per session: Not on file  . Stress: Not on file  Relationships  . Social connections:    Talks on phone: Not on file    Gets together: Not on file    Attends religious service: Not on file    Active member of club or organization: Not on file    Attends meetings of clubs or organizations: Not on file    Relationship status: Not on file  . Intimate partner violence:    Fear of current or ex partner: Not on file    Emotionally abused: Not on file    Physically abused: Not on file    Forced sexual activity: Not on file  Other Topics Concern  . Not on file  Social History Narrative  .  Not on file    Family History  Problem Relation Age of Onset  . Diabetes Mellitus II Brother   . Schizophrenia Maternal Uncle   . Alcohol abuse Neg Hx   . Anxiety disorder Neg Hx   . Bipolar disorder Neg Hx   . Dementia Neg Hx   . Depression Neg Hx   . Drug abuse Neg Hx   . Coronary artery disease Neg Hx     Outpatient Encounter Medications as of 11/30/2017  Medication Sig  . ARIPiprazole (ABILIFY) 10 MG tablet Take 1 tablet (10 mg total) by mouth daily.  Marland Kitchen FLUoxetine (PROZAC) 20 MG capsule TAKE 3 CAPSULES (60 MG TOTAL) BY MOUTH DAILY.  . [DISCONTINUED] asenapine (SAPHRIS) 5 MG SUBL 24 hr tablet ONE TWICE A DAY  . lansoprazole (PREVACID) 30 MG capsule Take 1 capsule (30 mg total) by mouth at bedtime.  . [DISCONTINUED] oseltamivir (TAMIFLU) 75 MG capsule Take 1 capsule (75 mg total) by mouth 2 (two) times daily. (Patient not taking: Reported on 10/25/2017)  . [DISCONTINUED] triamcinolone (KENALOG) 0.1 % paste Use as directed 1 application in the mouth or throat 2 (two) times daily. (Patient not taking: Reported on 10/25/2017)   No facility-administered  encounter medications on file as of 11/30/2017.           Objective:   Physical Exam  Constitutional: He is oriented to person, place, and time. He appears well-developed and well-nourished.  HENT:  Head: Normocephalic and atraumatic.  Cardiovascular: Normal rate, regular rhythm and normal heart sounds.  Pulmonary/Chest: Effort normal and breath sounds normal.  Abdominal: Soft. Bowel sounds are normal. He exhibits no distension and no mass. There is no tenderness. There is no rebound and no guarding.  Neurological: He is alert and oriented to person, place, and time.  Skin: Skin is warm and dry.  Psychiatric: He has a normal mood and affect. His behavior is normal.        Assessment & Plan:  GERD/reflux with intermittent nausea-recommend a trial of a PPI for 6 weeks.  If he is not feeling some improvement in symptoms after 2-3 weeks and please give me a call back.  Encouraged him to take his dose at bedtime since he is really having more nighttime symptoms.  Given additional information about food choices for people who have reflux.  We will get labs today to rule out liver and pancreatic problems.  If he is not improving then the plan will be to get an ultrasound to evaluate the gallbladder and also possibly referral to GI.

## 2017-12-01 LAB — LIPASE: Lipase: 23 U/L (ref 7–60)

## 2017-12-01 LAB — COMPLETE METABOLIC PANEL WITH GFR
AG Ratio: 2.1 (calc) (ref 1.0–2.5)
ALBUMIN MSPROF: 4.4 g/dL (ref 3.6–5.1)
ALKALINE PHOSPHATASE (APISO): 75 U/L (ref 40–115)
ALT: 20 U/L (ref 9–46)
AST: 22 U/L (ref 10–35)
BILIRUBIN TOTAL: 0.4 mg/dL (ref 0.2–1.2)
BUN: 13 mg/dL (ref 7–25)
CHLORIDE: 102 mmol/L (ref 98–110)
CO2: 29 mmol/L (ref 20–32)
Calcium: 9.2 mg/dL (ref 8.6–10.3)
Creat: 1.19 mg/dL (ref 0.70–1.33)
GFR, Est African American: 81 mL/min/{1.73_m2} (ref >=60)
GFR, Est Non African American: 70 mL/min/{1.73_m2} (ref >=60)
GLUCOSE: 72 mg/dL (ref 65–99)
Globulin: 2.1 g/dL (calc) (ref 1.9–3.7)
Potassium: 4 mmol/L (ref 3.5–5.3)
Sodium: 139 mmol/L (ref 135–146)
Total Protein: 6.5 g/dL (ref 6.1–8.1)

## 2017-12-01 LAB — AMYLASE: AMYLASE: 36 U/L (ref 21–101)

## 2017-12-10 ENCOUNTER — Other Ambulatory Visit (HOSPITAL_COMMUNITY): Payer: Self-pay | Admitting: Psychiatry

## 2017-12-24 ENCOUNTER — Other Ambulatory Visit: Payer: Self-pay | Admitting: Family Medicine

## 2017-12-24 DIAGNOSIS — R112 Nausea with vomiting, unspecified: Secondary | ICD-10-CM

## 2018-01-17 ENCOUNTER — Ambulatory Visit (INDEPENDENT_AMBULATORY_CARE_PROVIDER_SITE_OTHER): Payer: BC Managed Care – PPO | Admitting: Psychiatry

## 2018-01-17 ENCOUNTER — Encounter (HOSPITAL_COMMUNITY): Payer: Self-pay | Admitting: Psychiatry

## 2018-01-17 VITALS — BP 110/80 | HR 78 | Ht 71.0 in | Wt 215.0 lb

## 2018-01-17 DIAGNOSIS — F411 Generalized anxiety disorder: Secondary | ICD-10-CM

## 2018-01-17 DIAGNOSIS — Z87891 Personal history of nicotine dependence: Secondary | ICD-10-CM

## 2018-01-17 DIAGNOSIS — F1010 Alcohol abuse, uncomplicated: Secondary | ICD-10-CM | POA: Diagnosis not present

## 2018-01-17 DIAGNOSIS — F258 Other schizoaffective disorders: Secondary | ICD-10-CM

## 2018-01-17 DIAGNOSIS — F259 Schizoaffective disorder, unspecified: Secondary | ICD-10-CM

## 2018-01-17 DIAGNOSIS — Z818 Family history of other mental and behavioral disorders: Secondary | ICD-10-CM

## 2018-01-17 MED ORDER — FLUOXETINE HCL 20 MG PO CAPS: ORAL_CAPSULE | ORAL | 0 refills | Status: DC

## 2018-01-17 NOTE — Progress Notes (Signed)
Patient ID: Ronald [name], male   DOB: 21-Nov-1965, 52 y.o.   MRN: [id number]   [facility] Follow-up Outpatient Visit  Ronald [name] 1966/01/31 [id number] 52 y.o. 01/17/2018 4:18 PM  Chief Complaint:  HPI Comments: Ronald [name] is  a 52 y/o male with a past psychiatric history significant for Schizoaffective Disorder and Generalized anxiety disorder. The patient returns for psychiatric services for medication management.  Brief history as per previous notes" Patient started having paranoid delusions in 2009"  saphris has helped in past.   [place]. Not delusional. 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 She would talk too much  Modifying factor: meds  Past Medical Family, Social History:  Past Medical History:  Diagnosis Date  . Cancer (HCC)    Skin  . Squamous cell skin cancer, ala nasi    Family History  Problem Relation Age of Onset  . Diabetes Mellitus II Brother   . Schizophrenia Maternal Uncle   . Alcohol abuse Neg Hx   . Anxiety disorder Neg Hx   . Bipolar disorder Neg Hx   . Dementia Neg Hx   . Depression Neg Hx   . Drug abuse Neg Hx   . Coronary artery disease Neg Hx    Social History   Socioeconomic History  . Marital status: Single    Spouse name: Not on file  . Number of children: Not on file  . Years of education: Not on file  . Highest education level: Not on file  Occupational History  . Not on file  Social Needs  . Financial resource strain: Not on file  . Food insecurity:    Worry: Not on file    Inability: Not on file  . Transportation needs:    Medical: Not on file    Non-medical: Not on file  Tobacco Use  . Smoking status: Former Smoker    Packs/day: 1.00    Types: Cigarettes, Cigars    Last attempt to quit: 10/30/2008    Years since quitting: 9.2  . Smokeless tobacco: Never Used  Substance and Sexual Activity  . Alcohol use: Yes    Alcohol/week: 3.6 - 4.8  oz    Types: 6 - 8 Cans of beer per week    Comment: non-alchoholic  . Drug use: No  . Sexual activity: Never  Lifestyle  . Physical activity:    Days per week: Not on file    Minutes per session: Not on file  . Stress: Not on file  Relationships  . Social connections:    Talks on phone: Not on file    Gets together: Not on file    Attends religious service: Not on file    Active member of club or organization: Not on file    Attends meetings of clubs or organizations: Not on file    Relationship status: Not on file  Other Topics Concern  . Not on file  Social History Narrative  . Not on file    Outpatient Encounter Medications as of 01/17/2018  Medication Sig  . ARIPiprazole (ABILIFY) 10 MG tablet TAKE 1 TABLET BY MOUTH EVERY DAY  . FLUoxetine (PROZAC) 20 MG capsule TAKE 3 CAPSULES (60 MG TOTAL) BY MOUTH DAILY.  Marland Kitchen lansoprazole (PREVACID) 30 MG capsule TAKE 1 CAPSULE (30 MG TOTAL) BY MOUTH AT BEDTIME.  . [DISCONTINUED] FLUoxetine (PROZAC) 20 MG capsule TAKE 3 CAPSULES (60 MG TOTAL) BY MOUTH DAILY.   No  facility-administered encounter medications on file as of 01/17/2018.      Review of Systems  Cardiovascular: Negative for chest pain.  Skin: Negative for itching.  Neurological: Negative for tremors.  Psychiatric/Behavioral: Negative for depression, hallucinations and suicidal ideas. The patient is not nervous/anxious.    Vitals:   01/17/18 1554  BP: 110/80  Pulse: 78  Weight: 215 lb (97.5 kg)  Height: 5\' 11"  (1.803 m)     Psychiatric Specialty Exam: General Appearance: Casual and Well Groomed  Eye Contact::  Fair  Speech:  Clear and Coherent and Normal Rate  Volume:  Normal  Mood: fair  Affect:  congruent  Thought Process:  Coherent, Linear and Logical  Orientation:  Full (Time, Place, and Person)  Thought Content:  WDL  Suicidal Thoughts:  No  Homicidal Thoughts:  No  Memory:  Immediate;   Good Recent;   Good Remote;   Good  Judgement:  Fair  Insight:   Fair  Psychomotor Activity:  Normal  Concentration:  Good  Recall:  Negative  Akathisia:  Negative  Language-Intact  Fund of Knowledge-Average  Handed:  Right  AIMS (if indicated):   As noted in chart.  Assets:  Communication Skills Desire for Improvement Financial Resources/Insurance Intimacy Leisure Time Physical Health Resilience Social Support Talents/Skills     Assessment: Schizoaffective Disorder  Axis I: Schizoaffective Disorder. GAD. Weight change  Plan:  1. Schizoaffective disorder: fair. Continue abilify  2. [id number] on prozac, continue    reveiwed questions and renewed meds.fu 28m.     01/17/2018 4:18 PM

## 2018-03-20 ENCOUNTER — Encounter: Payer: Self-pay | Admitting: Family Medicine

## 2018-03-20 ENCOUNTER — Ambulatory Visit (INDEPENDENT_AMBULATORY_CARE_PROVIDER_SITE_OTHER): Payer: BC Managed Care – PPO

## 2018-03-20 ENCOUNTER — Ambulatory Visit (INDEPENDENT_AMBULATORY_CARE_PROVIDER_SITE_OTHER): Payer: BC Managed Care – PPO | Admitting: Family Medicine

## 2018-03-20 ENCOUNTER — Other Ambulatory Visit (HOSPITAL_COMMUNITY): Payer: Self-pay | Admitting: Psychiatry

## 2018-03-20 VITALS — BP 130/83 | HR 85 | Wt 216.0 lb

## 2018-03-20 DIAGNOSIS — M25572 Pain in left ankle and joints of left foot: Secondary | ICD-10-CM

## 2018-03-20 MED ORDER — DICLOFENAC SODIUM 1 % TD GEL: 2.0000 g | Freq: Four times a day (QID) | TRANSDERMAL | 11 refills | Status: DC

## 2018-03-20 MED ORDER — COLCHICINE 0.6 MG PO TABS: 0.6000 mg | ORAL_TABLET | Freq: Every day | ORAL | 1 refills | Status: DC | PRN

## 2018-03-20 NOTE — Patient Instructions (Signed)
Thank you for coming in today. Take colchicine for possible gout attack.  Take daily until the pain and swelling goes down.  Use the topical diclofenac gel 4 x daily for pain as needed.   USe an ankle sleeve.  Body Helix Full Ankle is a gocd product.   Use Cam [employer] as needed.   Get labs now.   Recheck in 2-4 weeks.  Return sooner if needed   Gout Gout is painful swelling that can happen in some of your joints. Gout is a type of arthritis. This condition is caused by having too much uric acid in your body. Uric acid is a chemical that is made when your body breaks down substances called purines. If your body has too much uric acid, sharp crystals can form and build up in your joints. This causes pain and swelling. Gout attacks can happen quickly and be very painful (acute gout). Over time, the attacks can affect more joints and happen more often (chronic gout). Follow these instructions at home: During a Gout Attack  If directed, put ice on the painful area: ? Put ice in a plastic bag. ? Place a towel between your skin and the bag. ? Leave the ice on for 20 minutes, 2-3 times a day.  Rest the joint as much as possible. If the joint is in your leg, you may be given crutches to use.  Raise (elevate) the painful joint above the level of your heart as often as you can.  Drink enough fluids to keep your pee (urine) clear or pale yellow.  Take over-the-counter and prescription medicines only as told by your doctor.  Do not drive or use heavy machinery while taking prescription pain medicine.  Follow instructions from your doctor about what you can or cannot eat and drink.  Return to your normal activities as told by your doctor. Ask your doctor what activities are safe for you. Avoiding Future Gout Attacks  Follow a low-purine diet as told by a specialist (dietitian) or your doctor. Avoid foods and drinks that have a lot of purines, such  as: ? Liver. ? Kidney. ? Anchovies. ? Asparagus. ? Herring. ? Mushrooms ? Mussels. ? Beer.  Limit alcohol intake to no more than 1 drink a day for nonpregnant women and 2 drinks a day for men. One drink equals 12 oz of beer, 5 oz of wine, or 1 oz of hard liquor.  Stay at a healthy weight or lose weight if you are overweight. If you want to lose weight, talk with your doctor. It is important that you do not lose weight too fast.  Start or continue an exercise plan as told by your doctor.  Drink enough fluids to keep your pee clear or pale yellow.  Take over-the-counter and prescription medicines only as told by your doctor.  Keep all follow-up visits as told by your doctor. This is important. Contact a doctor if:  You have another gout attack.  You still have symptoms of a gout attack after10 days of treatment.  You have problems (side effects) because of your medicines.  You have chills or a fever.  You have burning pain when you pee (urinate).  You have pain in your lower back or belly. Get help right away if:  You have very bad pain.  Your pain cannot be controlled.  You cannot pee. This information is not intended to replace advice given to you by your health care provider. Make sure you discuss  any questions you have with your health care provider. Document Released: 06/06/2008 Document Revised: 02/03/2016 Document Reviewed: 06/10/2015 Elsevier Interactive Patient Education  2018 [place].   Peroneal Tendinopathy Rehab Ask your health care provider which exercises are safe for you. Do exercises exactly as told by your health care provider and adjust them as directed. It is normal to feel mild stretching, pulling, tightness, or discomfort as you do these exercises, but you should stop right away if you feel sudden pain or your pain gets worse.Do not begin these exercises until told by your health care provider. Stretching and range of motion exercises These  exercises warm up your muscles and joints and improve the movement and flexibility of your ankle. These exercises also help to relieve pain and stiffness. Exercise A: Gastroc and soleus, standing 1. Stand on the edge of a step on the balls of your feet. The ball of your foot is on the walking surface, right under your toes. 2. Hold onto the railing for balance. 3. Slowly lift your left / right foot, allowing your body weight to press your left / right heel down over the edge of the step. You should feel a stretch in your left / right calf. 4. Hold this position for __________ seconds. Repeat __________ times with your left / right knee straight and __________ times with your left / right knee bent. Complete this stretch __________ times per day. Strengthening exercises These exercises improve the strength and endurance of your foot and ankle. Endurance is the ability to use your muscles for a long time, even after they get tired. Exercise B: Dorsiflexors  1. Secure a rubber exercise band or tube to an object, like a table leg, that will not move if it is pulled on. 2. Secure the other end of the band around your left / right foot. 3. Sit on the floor, facing the object with your left / right foot extended. The band or tube should be slightly tense when your foot is relaxed. 4. Slowly flex your left / right ankle and toes to bring your foot toward you. 5. Hold this position for __________ seconds. 6. Slowly return your foot to the starting position. Repeat __________ times. Complete this exercise __________ times per day. Exercise C: Evertors 1. Sit on the floor with your legs straight out in front of you. 2. Loop a rubber exercise or band or tube around the ball of your left / right foot. The ball of your foot is on the walking surface, right under your toes. 3. Hold the ends of the band in your hands, or secure the band to a stable object. 4. Slowly push your foot outward, away from your other  leg. 5. Hold this position for __________ seconds. 6. Slowly return your foot to the starting position. Repeat __________ times. Complete this exercise __________ times per day. Exercise D: Standing heel raise ( plantar flexion) 1. Stand with your feet shoulder-width apart with the balls of your feet on a step. The ball of your foot is on the walking surface, right under your toes. 2. Keep your weight spread evenly over the width of your feet while you rise up on your toes. Use a wall or railing to steady yourself, but try not to use it for support. 3. If this exercise is too easy, try these options: ? Shift your weight toward your left / right leg until you feel challenged. ? If told by your health care provider, stand on  your left / right leg only. 4. Hold this position for __________ seconds. Repeat __________ times. Complete this exercise __________ times per day. Exercise E: Single leg stand 1. Without shoes, stand near a railing or in a doorway. You may hold onto the railing or door frame as needed. 2. Stand on your left / right foot. Keep your big toe down on the floor and try to keep your arch lifted. ? Do not roll to the outside of your foot. ? If this exercise is too easy, you can try it with your eyes closed or while standing on a pillow. 3. Hold this position for __________ seconds. Repeat __________ times. Complete this exercise __________ times per day. This information is not intended to replace advice given to you by your health care provider. Make sure you discuss any questions you have with your health care provider. Document Released: 08/28/2005 Document Revised: 05/04/2016 Document Reviewed: 07/17/2015 Elsevier Interactive Patient Education  [employer].

## 2018-03-20 NOTE — Progress Notes (Signed)
Ronald [name] is a 52 y.o. male who presents to [facility] today for left ankle pain.  Her notes a one-week history of left ankle pain and swelling.  He denies any injury.  He notes the pain and swelling are located predominantly lateral aspect of the ankle and worse with activity.  He denies any inversion or instability.  He denies significant locking or catching.  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  He is tried over-the-counter medications such as ibuprofen and Aleve which have helped a bit.  No radiating pain fevers or chills nausea vomiting or diarrhea.    ROS:  As above  Exam:  BP 130/83   Pulse 85   Wt 216 lb (98 kg)   BMI 30.13 kg/m  General: Well Developed, well nourished, and in no acute distress.  Neuro/Psych: Alert and oriented x3, extra-ocular muscles intact, able to move all 4 extremities, sensation grossly intact. Skin: Warm and dry, no rashes noted.  Respiratory: Not using accessory muscles, speaking in full sentences, trachea midline.  Cardiovascular: Pulses palpable, no extremity edema. Abdomen: Does not appear distended. MSK:  Left ankle mild lateral effusion otherwise normal-appearing with no erythema or ecchymosis. Full range of motion. Tender to palpation at the posterior aspect of the lateral malleolus and ATFL area. Strength is intact however pain is present with resisted foot eversion Stable ligamentous exam. Pulses capillary refill and sensation are intact distally.    Lab and Radiology Results No results found for this or any previous visit (from the past 72 hour(s)). Dg Ankle Complete Left  Result Date: 03/20/2018 CLINICAL DATA:  Lateral LEFT ankle pain, swelling and instability since Monday, denies injury EXAM: LEFT ANKLE COMPLETE - 3+ VIEW COMPARISON:  None FINDINGS: Lateral soft tissue swelling. Osseous mineralization normal. Ankle  joint space preserved. No acute fracture, dislocation, or bone destruction. Calcification at distal Achilles tendon and insertion site. IMPRESSION: No acute osseous abnormalities. Distal Achilles tendon calcification which may reflect tendinopathy or prior injury. Electronically Signed   By: Lavonia Dana M.D.   On: 03/20/2018 13:00    I personally (independently) visualized and performed the interpretation of the images attached in this note.    Assessment and Plan: 52 y.o. male with  Left ankle pain.  Ankle effusion with pain occurring without injury is somewhat concerning for gout.  Especially as patient does not have significant DJD present.  The differential also includes peroneal tendinitis which he may have it is relating to a compensatory gait because of his ankle pain.  We discussed options patient declined aspiration injection at this time.  Plan to complete work-up with CBC metabolic panel and uric acid.  Empiric treatment with compressive ankle sleeve home exercise program diclofenac gel and colchicine.  Recheck in 2 weeks.  If symptoms are not well controlled with current management patient may use a Cam walker boot as needed for pain control while walking at work.    Orders Placed This Encounter  Procedures  . DG Ankle Complete Left    Standing Status:   Future    Number of Occurrences:   1    Standing Expiration Date:   05/22/2019    Order Specific Question:   Reason for Exam (SYMPTOM  OR DIAGNOSIS REQUIRED)    Answer:   eval pain and swelling lateral ankle    Order Specific Question:   Preferred imaging location?  Answer:   [facility]    Order Specific Question:   Radiology Contrast Protocol - do NOT remove file path    Answer:   [email or web address]  . CBC  . COMPLETE METABOLIC PANEL WITH GFR  . Uric acid   Meds ordered this encounter  Medications  . colchicine 0.6 MG tablet    Sig: Take 1 tablet (0.6 mg total) by  mouth daily as needed.    Dispense:  30 tablet    Refill:  1  . diclofenac sodium (VOLTAREN) 1 % GEL    Sig: Apply 2 g topically 4 (four) times daily. To affected joint.    Dispense:  100 g    Refill:  11    Historical information moved to improve visibility of documentation.  Past Medical History:  Diagnosis Date  . Cancer (HCC)    Skin  . Squamous cell skin cancer, ala nasi    Past Surgical History:  Procedure Laterality Date  . SKIN BIOPSY     Social History   Tobacco Use  . Smoking status: Former Smoker    Packs/day: 1.00    Types: Cigarettes, Cigars    Last attempt to quit: 10/30/2008    Years since quitting: 9.3  . Smokeless tobacco: Never Used  Substance Use Topics  . Alcohol use: Yes    Alcohol/week: 3.6 - 4.8 oz    Types: 6 - 8 Cans of beer per week    Comment: non-alchoholic   family history includes Diabetes Mellitus II in his brother; Schizophrenia in his maternal uncle.  Medications: Current Outpatient Medications  Medication Sig Dispense Refill  . ARIPiprazole (ABILIFY) 10 MG tablet TAKE 1 TABLET BY MOUTH EVERY DAY 90 tablet 0  . FLUoxetine (PROZAC) 20 MG capsule TAKE 3 CAPSULES (60 MG TOTAL) BY MOUTH DAILY. 270 capsule 0  . lansoprazole (PREVACID) 30 MG capsule TAKE 1 CAPSULE (30 MG TOTAL) BY MOUTH AT BEDTIME. 90 capsule 3  . colchicine 0.6 MG tablet Take 1 tablet (0.6 mg total) by mouth daily as needed. 30 tablet 1  . diclofenac sodium (VOLTAREN) 1 % GEL Apply 2 g topically 4 (four) times daily. To affected joint. 100 g 11   No current facility-administered medications for this visit.    Allergies  Allergen Reactions  . Bee Venom Swelling  . Penicillins       Discussed warning signs or symptoms. Please see discharge instructions. Patient expresses understanding.

## 2018-03-21 LAB — COMPLETE METABOLIC PANEL WITH GFR
AG Ratio: 2.2 (calc) (ref 1.0–2.5)
ALT: 19 U/L (ref 9–46)
AST: 19 U/L (ref 10–35)
Albumin: 4.6 g/dL (ref 3.6–5.1)
Alkaline phosphatase (APISO): 84 U/L (ref 40–115)
BUN: 17 mg/dL (ref 7–25)
CALCIUM: 9.6 mg/dL (ref 8.6–10.3)
CO2: 30 mmol/L (ref 20–32)
CREATININE: 1.14 mg/dL (ref 0.70–1.33)
Chloride: 101 mmol/L (ref 98–110)
GFR, EST NON AFRICAN AMERICAN: 74 mL/min/{1.73_m2} (ref >=60)
GFR, Est African American: 85 mL/min/{1.73_m2} (ref >=60)
Globulin: 2.1 g/dL (calc) (ref 1.9–3.7)
Glucose, Bld: 94 mg/dL (ref 65–99)
POTASSIUM: 4.5 mmol/L (ref 3.5–5.3)
Sodium: 137 mmol/L (ref 135–146)
Total Bilirubin: 0.4 mg/dL (ref 0.2–1.2)
Total Protein: 6.7 g/dL (ref 6.1–8.1)

## 2018-03-21 LAB — CBC
HCT: 45.6 % (ref 38.5–50.0)
HEMOGLOBIN: 15.6 g/dL (ref 13.2–17.1)
MCH: 28.7 pg (ref 27.0–33.0)
MCHC: 34.2 g/dL (ref 32.0–36.0)
MCV: 84 fL (ref 80.0–100.0)
MPV: 8.9 fL (ref 7.5–12.5)
PLATELETS: 210 10*3/uL (ref 140–400)
RBC: 5.43 10*6/uL (ref 4.20–5.80)
RDW: 13.2 % (ref 11.0–15.0)
WBC: 6 10*3/uL (ref 3.8–10.8)

## 2018-03-21 LAB — URIC ACID: URIC ACID, SERUM: 4.5 mg/dL (ref 4.0–8.0)

## 2018-03-21 MED ORDER — NAPROXEN 500 MG PO TABS: 500.0000 mg | ORAL_TABLET | Freq: Two times a day (BID) | ORAL | 0 refills | Status: DC | PRN

## 2018-03-21 NOTE — Addendum Note (Signed)
Addended by: Gregor Hams on: 03/21/2018 05:47 AM   Modules accepted: Orders

## 2018-04-03 ENCOUNTER — Other Ambulatory Visit: Payer: Self-pay | Admitting: *Deleted

## 2018-04-03 ENCOUNTER — Other Ambulatory Visit: Payer: Self-pay | Admitting: Family Medicine

## 2018-04-03 MED ORDER — COLCHICINE 0.6 MG PO TABS: 0.6000 mg | ORAL_TABLET | Freq: Every day | ORAL | 1 refills | Status: DC | PRN

## 2018-04-04 ENCOUNTER — Ambulatory Visit (INDEPENDENT_AMBULATORY_CARE_PROVIDER_SITE_OTHER): Payer: BC Managed Care – PPO | Admitting: Family Medicine

## 2018-04-04 ENCOUNTER — Encounter: Payer: Self-pay | Admitting: Family Medicine

## 2018-04-04 VITALS — BP 110/62 | HR 90 | Ht 71.0 in | Wt 213.0 lb

## 2018-04-04 DIAGNOSIS — K2100 Gastro-esophageal reflux disease with esophagitis, without bleeding: Secondary | ICD-10-CM

## 2018-04-04 DIAGNOSIS — M25572 Pain in left ankle and joints of left foot: Secondary | ICD-10-CM

## 2018-04-04 DIAGNOSIS — H539 Unspecified visual disturbance: Secondary | ICD-10-CM

## 2018-04-04 NOTE — Progress Notes (Signed)
Subjective:    Patient ID: Ronald [name], male    DOB: 24-Oct-1965, 52 y.o.   MRN: [id number]  HPI 52 year old male is here today to follow-up for reflux.  When I last saw him back in March she was waking up feeling nauseated and having some right-sided upper abdominal pain.  Is also experiencing heartburn and brash.  He started taking the PPI and says within 2 days felt significantly better.  He can now sleep without waking up feeling nauseated he can even sleep on his right side.  He has been trying to wean off of the PPI more recently and has been doing okay without any recurrence of symptoms.  He also wanted me to look at his left ankle today.  He did see Dr. Georgina Snell about it has a follow-up next week which he plans on keeping but just wanted me to pick at it today.  He felt a pop when he was trying to sit down while playing guitar.  He says at the time it was not a painful pop but a couple days later noticed some swelling over the lateral ankle and some tightness and discomfort.  He says he does feel like it has been gradually getting a little bit better.  In fact he was able to walk 6 miles today without any significant discomfort.  He really only feels it while walking uphill.  But the swelling is still there.  He did have negative x-rays but was worried about the potential for fracture.  He also notes that he has had something in his vision which acutely in his right eye in the left lower quadrant area.  He says it usually just shows up for a few seconds and then goes away.  He said initially it looked more yellow now it looks more green.  He unfortunately had an eye appointment but did not have his insurance card and so had to reschedule.  The next available appointment was actually in November.  He is not been having any pain or major vision changes except for the spot in the vision.  He is never had any problems with his eyes before.  Review of Systems  BP 110/62   Pulse 90   Ht 5\' 11"   (1.803 m)   Wt 213 lb (96.6 kg)   BMI 29.71 kg/m     Allergies  Allergen Reactions  . Bee Venom Swelling  . Penicillins     Past Medical History:  Diagnosis Date  . Cancer (HCC)    Skin  . Squamous cell skin cancer, ala nasi     Past Surgical History:  Procedure Laterality Date  . SKIN BIOPSY      Social History   Socioeconomic History  . Marital status: Single    Spouse name: Not on file  . Number of children: Not on file  . Years of education: Not on file  . Highest education level: Not on file  Occupational History  . Not on file  Social Needs  . Financial resource strain: Not on file  . Food insecurity:    Worry: Not on file    Inability: Not on file  . Transportation needs:    Medical: Not on file    Non-medical: Not on file  Tobacco Use  . Smoking status: Former Smoker    Packs/day: 1.00    Types: Cigarettes, Cigars    Last attempt to quit: 10/30/2008    Years since quitting: 9.4  .  Smokeless tobacco: Never Used  Substance and Sexual Activity  . Alcohol use: Yes    Alcohol/week: 3.6 - 4.8 oz    Types: 6 - 8 Cans of beer per week    Comment: non-alchoholic  . Drug use: No  . Sexual activity: Never  Lifestyle  . Physical activity:    Days per week: Not on file    Minutes per session: Not on file  . Stress: Not on file  Relationships  . Social connections:    Talks on phone: Not on file    Gets together: Not on file    Attends religious service: Not on file    Active member of club or organization: Not on file    Attends meetings of clubs or organizations: Not on file    Relationship status: Not on file  . Intimate partner violence:    Fear of current or ex partner: Not on file    Emotionally abused: Not on file    Physically abused: Not on file    Forced sexual activity: Not on file  Other Topics Concern  . Not on file  Social History Narrative  . Not on file    Family History  Problem Relation Age of Onset  . Diabetes Mellitus II  Brother   . Schizophrenia Maternal Uncle   . Alcohol abuse Neg Hx   . Anxiety disorder Neg Hx   . Bipolar disorder Neg Hx   . Dementia Neg Hx   . Depression Neg Hx   . Drug abuse Neg Hx   . Coronary artery disease Neg Hx     Outpatient Encounter Medications as of 04/04/2018  Medication Sig  . ARIPiprazole (ABILIFY) 10 MG tablet TAKE 1 TABLET BY MOUTH EVERY DAY  . FLUoxetine (PROZAC) 20 MG capsule TAKE 3 CAPSULES (60 MG TOTAL) BY MOUTH DAILY.  Marland Kitchen lansoprazole (PREVACID) 30 MG capsule TAKE 1 CAPSULE (30 MG TOTAL) BY MOUTH AT BEDTIME.  . [DISCONTINUED] colchicine 0.6 MG tablet Take 1 tablet (0.6 mg total) by mouth daily as needed.  . [DISCONTINUED] diclofenac sodium (VOLTAREN) 1 % GEL Apply 2 g topically 4 (four) times daily. To affected joint.  . [DISCONTINUED] naproxen (NAPROSYN) 500 MG tablet Take 1 tablet (500 mg total) by mouth 2 (two) times daily as needed for mild pain.   No facility-administered encounter medications on file as of 04/04/2018.          Objective:   Physical Exam  Constitutional: He is oriented to person, place, and time. He appears well-developed and well-nourished.  HENT:  Head: Normocephalic and atraumatic.  Right Ear: External ear normal.  Left Ear: External ear normal.  Nose: Nose normal.  Mouth/Throat: Oropharynx is clear and moist.  Eyes: Pupils are equal, round, and reactive to light. Conjunctivae and EOM are normal.  Neck: Normal range of motion. Neck supple. No thyromegaly present.  Cardiovascular: Normal rate, regular rhythm, normal heart sounds and intact distal pulses.  Pulmonary/Chest: Effort normal and breath sounds normal.  Abdominal: Soft. Bowel sounds are normal. He exhibits no distension and no mass. There is no tenderness. There is no rebound and no guarding.  Musculoskeletal: Normal range of motion.  Does have some swelling over the distal lateral malleolus.  Nontender on exam.  Normal range of motion.  No erythema.  Lymphadenopathy:     He has no cervical adenopathy.  Neurological: He is alert and oriented to person, place, and time. He has normal reflexes.  Skin: Skin is warm  and dry.  No worrisome moles on his back.  Psychiatric: He has a normal mood and affect. His behavior is normal. Judgment and thought content normal.        Assessment & Plan:  GERD with esophagitis-continue PRN PPI use.  Warned about potential long-term side effects and try to wean off as he is able to.  Left lateral ankle swelling-most likely soft tissue injury.  I think we will just continue to get better slowly.  He is very physically active and walks a lot so it may take a little longer to heal but I really do not think there is anything fractured and gave him that reassurance.  Visual changes in the left eye-encouraged him to call his eye doctor and get a sooner appointment especially since he is getting something appearing intermittently in that left lower quadrant of his visual field.

## 2018-04-10 ENCOUNTER — Ambulatory Visit (INDEPENDENT_AMBULATORY_CARE_PROVIDER_SITE_OTHER): Payer: BC Managed Care – PPO | Admitting: Family Medicine

## 2018-04-10 ENCOUNTER — Encounter: Payer: Self-pay | Admitting: Family Medicine

## 2018-04-10 VITALS — BP 135/86 | HR 98 | Ht 71.0 in | Wt 212.0 lb

## 2018-04-10 DIAGNOSIS — M7672 Peroneal tendinitis, left leg: Secondary | ICD-10-CM | POA: Diagnosis not present

## 2018-04-10 NOTE — Patient Instructions (Signed)
Thank you for coming in today. Call or go to the ER if you develop a large red swollen joint with extreme pain or oozing puss.  Use the walker boot for 1-2 weeks.  Do the exercises.  Recheck in 3-4 weeks.  Return sooner if needed.   Peroneal Tendinopathy Peroneal tendinopathy is irritation of the tendons that pass behind your ankle (peroneal tendons). These tendons attach muscles in your foot to a bone on the side of your foot and underneath the arch of your foot. This condition can cause your peroneal tendons to get bigger and swell. What are the causes? This condition may be caused by:  Putting stress on your ankle over and over again (overuse injury).  A sudden injury that puts stress on your tendons, such as an ankle sprain.  What increases the risk? This condition is more likely to develop in:  People who have high arches.  Athletes who play sports that involve putting stress on the ankle over and over again. These sports include: ? Running. ? Dancing. ? Soccer. ? Basketball.  What are the signs or symptoms? Symptoms of this condition can start suddenly or develop gradually. Symptoms include:  Pain in the back of the ankle, on the side of the foot, or in the arch of the foot.  Pain that gets worse with activity and better with rest.  Swelling.  Warmth.  Weakness in your foot or ankle.  How is this diagnosed? This condition may be diagnosed based on:  Your symptoms.  Your medical history.  A physical exam.  Imaging tests, such as: ? An X-ray or CT scan to check for bone injury. ? MRI or ultrasound to check for muscle or tendon injury.  During your physical exam, your health care provider may move your foot and ankle and test the strength of your leg muscles. How is this treated? This condition may be treated by:  Keeping your body weight off your ankle for several days.  Returning gradually to full activity gradually.  Putting ice on your ankle to  reduce swelling.  Taking an anti-inflammatory pain medicine (NSAID).  Having medicine injected into your tendon to reduce swelling.  Wearing a removable boot or brace for ankle support.  Doing range-of-motion exercises and strengthening exercises (physical therapy) when pain and swelling improve.  If the condition does not improve with treatment, or if a tendon or muscle is damaged, surgery may be needed. Follow these instructions at home: If you have a boot or brace:  Wear it as told by your health care provider. Remove it only as told by your health care provider.  Loosen it if your toes tingle, become numb, or turn cold and blue.  Do not let it get wet if it is not waterproof.  Keep it clean. Managing pain, stiffness, and swelling  If directed, apply ice to the injured area: ? Put ice in a plastic bag. ? Place a towel between your skin and the bag. ? Leave the ice on for 20 minutes, 2-3 times a day.  Take over-the-counter and prescription medicines only as told by your health care provider.  Raise (elevate) your ankle above the level of your heart when resting if you have swelling. Activity  Do not use your ankle to support (bear) your full body weight until your health care provider says that you can.  Do not do activities that make pain or swelling worse.  Return to your normal activities as told by your health  care provider. General instructions  Keep all follow-up visits as told by your health care provider. This is important. How is this prevented?  Wear supportive footwear that is appropriate for your athletic activity.  Avoid athletic activities that cause swelling or pain in your ankle or foot.  See your health care provider if you have pain or swelling that does not improve after a few days of rest.  Stop training if you develop pain or swelling.  If you start a new athletic activity, start gradually to build up your strength, endurance, and  flexibility. Contact a health care provider if:  Your symptoms get worse.  Your symptoms do not improve in 2-4 weeks.  You develop new, unexplained symptoms. This information is not intended to replace advice given to you by your health care provider. Make sure you discuss any questions you have with your health care provider. Document Released: 08/28/2005 Document Revised: 05/02/2016 Document Reviewed: 07/17/2015 Elsevier Interactive Patient Education  [employer].

## 2018-04-10 NOTE — Progress Notes (Signed)
Ronald [name] is a 52 y.o. male who presents to [facility] today for left ankle pain.  Patient was seen on July 10 for left ankle pain.  He had a ankle effusion and pain and was thought to have a possible gout flare.  Uric acid was checked and found to be in the normal range.  He was given some home exercise programs and diclofenac gel which has not helped.  He notes improved but continued left medial ankle pain.  He traces the area posterior to lateral malleolus down to the lateral midfoot is an area of maximal pain.  Pain is worse with climbing stairs and plantar flexion.  No radiating pain weakness or numbness.  No injury history.  No fevers or chills.    ROS:  As above  Exam:  BP 135/86   Pulse 98   Ht 5\' 11"  (1.803 m)   Wt 212 lb (96.2 kg)   BMI 29.57 kg/m  General: Well Developed, well nourished, and in no acute distress.  Neuro/Psych: Alert and oriented x3, extra-ocular muscles intact, able to move all 4 extremities, sensation grossly intact. Skin: Warm and dry, no rashes noted.  Respiratory: Not using accessory muscles, speaking in full sentences, trachea midline.  Cardiovascular: Pulses palpable, no extremity edema. Abdomen: Does not appear distended. MSK:  Left ankle: Slightly swollen along the posterior aspect of the lateral malleolus.  Tender to palpation here.  Ankle motion is intact.  Stable ligamentous exam.  Intact strength.  Pain with resisted ankle plantarflexion and eversion.  Procedure: Real-time Ultrasound Guided Injection of left ankle peroneal tendon sheath  Device: GE Logiq E   Images permanently stored and available for review in the ultrasound unit. Verbal informed consent obtained.  Discussed risks and benefits of procedure. Warned about infection bleeding damage to structures skin hypopigmentation and fat atrophy among others. Patient expresses understanding and agreement Time-out conducted.   Noted no  overlying erythema, induration, or other signs of local infection.   Skin prepped in a sterile fashion.   Local anesthesia: Topical Ethyl chloride.   With sterile technique and under real time ultrasound guidance:  40mg  kenalog and 1.64ml marcaine injected easily.   Completed without difficulty   Pain immediately resolved suggesting accurate placement of the medication.   Advised to call if fevers/chills, erythema, induration, drainage, or persistent bleeding.   Images permanently stored and available for review in the ultrasound unit.  Impression: Technically successful ultrasound guided injection.    Lab and Radiology Results Musculoskeletal ultrasound of left lateral ankle peroneal tendon reveals intact peroneal brevis tendon.  There is significant hyperechoic fluid within the peroneal tendon sheath and a crescent-shaped tendinous structure consistent with a partial tear of the peroneal longus tendon.  This extends from the area just posterior to the malleolus into the midfoot.  The peroneal muscles are intact appearing in the insertion to the fifth metatarsal is intact.  No bony structures.   EXAM: LEFT ANKLE COMPLETE - 3+ VIEW  COMPARISON:  None  FINDINGS: Lateral soft tissue swelling.  Osseous mineralization normal.  Ankle joint space preserved.  No acute fracture, dislocation, or bone destruction.  Calcification at distal Achilles tendon and insertion site.  IMPRESSION: No acute osseous abnormalities.  Distal Achilles tendon calcification which may reflect tendinopathy or prior injury.   Electronically Signed   By: Lavonia Dana M.D.   On: 03/20/2018 13:00 I personally (independently) visualized and performed the interpretation of the images attached in  this note.     Assessment and Plan: 52 y.o. male with  Left lateral ankle pain is peroneal tendinitis with possible partial thickness peroneal tendon tear.  Plan for injection as above and CAM Walker boot  for 2 weeks.  Plan for resuming home exercise and recheck in 3 to 4 weeks.  Work note provided.  Return sooner if needed.    Historical information moved to improve visibility of documentation.  Past Medical History:  Diagnosis Date  . Cancer (HCC)    Skin  . Schizoaffective disorder ([place]) 07/14/2008   Qualifier: Diagnosis of  By: [name], Barnetta Chapel    . Squamous cell skin cancer, ala nasi    Past Surgical History:  Procedure Laterality Date  . SKIN BIOPSY     Social History   Tobacco Use  . Smoking status: Former Smoker    Packs/day: 1.00    Types: Cigarettes, Cigars    Last attempt to quit: 10/30/2008    Years since quitting: 9.4  . Smokeless tobacco: Never Used  Substance Use Topics  . Alcohol use: Yes    Alcohol/week: 3.6 - 4.8 oz    Types: 6 - 8 Cans of beer per week    Comment: non-alchoholic   family history includes Diabetes Mellitus II in his brother; Schizophrenia in his maternal uncle.  Medications: Current Outpatient Medications  Medication Sig Dispense Refill  . ARIPiprazole (ABILIFY) 10 MG tablet TAKE 1 TABLET BY MOUTH EVERY DAY 90 tablet 0  . FLUoxetine (PROZAC) 20 MG capsule TAKE 3 CAPSULES (60 MG TOTAL) BY MOUTH DAILY. 270 capsule 0  . lansoprazole (PREVACID) 30 MG capsule TAKE 1 CAPSULE (30 MG TOTAL) BY MOUTH AT BEDTIME. 90 capsule 3   No current facility-administered medications for this visit.    Allergies  Allergen Reactions  . Bee Venom Swelling  . Penicillins       Discussed warning signs or symptoms. Please see discharge instructions. Patient expresses understanding.

## 2018-04-18 ENCOUNTER — Encounter: Payer: Self-pay | Admitting: Emergency Medicine

## 2018-04-18 ENCOUNTER — Emergency Department
Admission: EM | Admit: 2018-04-18 | Discharge: 2018-04-18 | Disposition: A | Discharge status: Home / Self Care | Payer: BC Managed Care – PPO | Source: Home / Self Care

## 2018-04-18 ENCOUNTER — Other Ambulatory Visit: Payer: Self-pay

## 2018-04-18 DIAGNOSIS — L821 Other seborrheic keratosis: Secondary | ICD-10-CM

## 2018-04-18 MED ORDER — TRIAMCINOLONE ACETONIDE 0.1 % EX CREA: 1.0000 "application " | TOPICAL_CREAM | Freq: Two times a day (BID) | CUTANEOUS | 0 refills | Status: DC

## 2018-04-18 NOTE — Discharge Instructions (Signed)
Expect your symptoms to resolve over the next week with the use of the cream.

## 2018-04-18 NOTE — ED Provider Notes (Signed)
[name]    CSN: [id number] Arrival date & time: 04/18/18  1900     History   Chief Complaint Chief Complaint  Patient presents with  . Rash    HPI Ronald [name] is a 52 y.o. male.   HPI This 52 year old man comes to the [facility] urgent care center complaining about skin pain just below his right scapula that been persistent for about a week.  He is felt a small irregularity and because of his fair skin and past history of skin cancer, patient wanted to make sure he did not have another skin cancer.  He has not seen his dermatologist in some time.  Patient's job requires that he walk about 6 miles a day.  He is wearing a splint on his left ankle. Past Medical History:  Diagnosis Date  . Cancer (HCC)    Skin  . Hyperlipidemia 01/20/2015  . Schizoaffective disorder ([place]) 07/14/2008   Qualifier: Diagnosis of  By: [name], Barnetta Chapel    . Squamous cell skin cancer, ala nasi     Patient Active Problem List   Diagnosis Date Noted  . Peroneal tendinitis of left lower extremity 04/10/2018  . Hyperlipidemia 01/20/2015  . History of basal cell cancer 01/16/2014  . LACERATION 07/14/2010  . BACK PAIN, THORACIC REGION 12/22/2008  . FATIGUE 12/22/2008  . NIGHT SWEATS 10/27/2008  . Schizoaffective disorder ([place]) 07/14/2008    Past Surgical History:  Procedure Laterality Date  . SKIN BIOPSY         Home Medications    Prior to Admission medications   Medication Sig Start Date End Date Taking? Authorizing Provider  ARIPiprazole (ABILIFY) 10 MG tablet TAKE 1 TABLET BY MOUTH EVERY DAY 03/20/18   Merian Capron, MD  FLUoxetine (PROZAC) 20 MG capsule TAKE 3 CAPSULES (60 MG TOTAL) BY MOUTH DAILY. 01/17/18   Merian Capron, MD  lansoprazole (PREVACID) 30 MG capsule TAKE 1 CAPSULE (30 MG TOTAL) BY MOUTH AT BEDTIME. 12/24/17   Hali Marry, MD  triamcinolone cream (KENALOG) 0.1 % Apply 1 application topically 2 (two) times daily. 04/18/18   Robyn Haber, MD    Family History Family History  Problem Relation Age of Onset  . Diabetes Mellitus II Brother   . Schizophrenia Maternal Uncle   . Alcohol abuse Neg Hx   . Anxiety disorder Neg Hx   . Bipolar disorder Neg Hx   . Dementia Neg Hx   . Depression Neg Hx   . Drug abuse Neg Hx   . Coronary artery disease Neg Hx     Social History Social History   Tobacco Use  . Smoking status: Former Smoker    Packs/day: 1.00    Types: Cigarettes, Cigars    Last attempt to quit: 10/30/2008    Years since quitting: 9.4  . Smokeless tobacco: Never Used  Substance Use Topics  . Alcohol use: Yes    Alcohol/week: 6.0 - 8.0 standard drinks    Types: 6 - 8 Cans of beer per week    Comment: non-alchoholic  . Drug use: No     Allergies   Bee venom and Penicillins   Review of Systems Review of Systems   Physical Exam Triage Vital Signs ED Triage Vitals  Enc Vitals Group     BP 04/18/18 1920 114/77     Pulse Rate 04/18/18 1920 81     Resp --      Temp 04/18/18 1920 98.7 F (37.1 C)  Temp Source 04/18/18 1920 Oral     SpO2 04/18/18 1920 98 %     Weight 04/18/18 1921 217 lb (98.4 kg)     Height 04/18/18 1921 5\' 11"  (1.803 m)     Head Circumference --      Peak Flow --      Pain Score 04/18/18 1921 2     Pain Loc --      Pain Edu? --      Excl. in [place]? --    No data found.  Updated Vital Signs BP 114/77 (BP Location: Right Arm)   Pulse 81   Temp 98.7 F (37.1 C) (Oral)   Ht 5\' 11"  (1.803 m)   Wt 98.4 kg   SpO2 98%   BMI 30.27 kg/m    Physical Exam  Constitutional: He is oriented to person, place, and time. He appears well-developed and well-nourished.  HENT:  Head: Normocephalic.  Eyes: Pupils are equal, round, and reactive to light. Conjunctivae are normal.  Neck: Normal range of motion. Neck supple.  Pulmonary/Chest: Effort normal.  Musculoskeletal: Normal range of motion.  Neurological: He is alert and oriented to person, place, and time.  Skin: Skin  is warm and dry.  Small papule on right infrascapular area of thoracic back.  No erythema, telangiectasia, erosion.  Psychiatric: He has a normal mood and affect.  Nursing note and vitals reviewed.    UC Treatments / Results  Labs (all labs ordered are listed, but only abnormal results are displayed) Labs Reviewed - No data to display  EKG None  Radiology No results found.  Procedures Procedures (including critical care time)  Medications Ordered in UC Medications - No data to display  Initial Impression / Assessment and Plan / UC Course  I have reviewed the triage vital signs and the nursing notes.  Pertinent labs & imaging results that were available during my care of the patient were reviewed by me and considered in my medical decision making (see chart for details).    Final Clinical Impressions(s) / UC Diagnoses   Final diagnoses:  Seborrheic keratosis   Discharge Instructions   None    ED Prescriptions    Medication Sig Dispense Auth. Provider   triamcinolone cream (KENALOG) 0.1 % Apply 1 application topically 2 (two) times daily. 30 g Robyn Haber, MD     Controlled Substance Prescriptions [place] Controlled Substance Registry consulted? Not Applicable   Robyn Haber, MD 04/18/18 1929

## 2018-04-18 NOTE — ED Triage Notes (Signed)
Sensitive spot on right shoulder blade x 1 week

## 2018-05-01 ENCOUNTER — Encounter: Payer: Self-pay | Admitting: Family Medicine

## 2018-05-01 ENCOUNTER — Ambulatory Visit (INDEPENDENT_AMBULATORY_CARE_PROVIDER_SITE_OTHER): Payer: BC Managed Care – PPO | Admitting: Family Medicine

## 2018-05-01 VITALS — BP 118/75 | HR 85 | Ht 71.0 in | Wt 208.0 lb

## 2018-05-01 DIAGNOSIS — M7672 Peroneal tendinitis, left leg: Secondary | ICD-10-CM | POA: Diagnosis not present

## 2018-05-01 NOTE — Patient Instructions (Addendum)
Thank you for coming in today. Advance activity as tolerated.  Start the band exercises.  Do about 30 reps 3x daily.  Advance to the standing heel up and down slowly exercises.  If the yellow band is not had enough you can get a green theraband over the counter.   Recheck in 6 weeks. If doing great we do not need a formal appointment.  If not doing great I want to see you.  If worsening let me know.   Use the ankle brace with activity for 4 weeks.    Peroneal Tendinopathy Rehab Ask your health care provider which exercises are safe for you. Do exercises exactly as told by your health care provider and adjust them as directed. It is normal to feel mild stretching, pulling, tightness, or discomfort as you do these exercises, but you should stop right away if you feel sudden pain or your pain gets worse.Do not begin these exercises until told by your health care provider. Stretching and range of motion exercises These exercises warm up your muscles and joints and improve the movement and flexibility of your ankle. These exercises also help to relieve pain and stiffness. Exercise A: Gastroc and soleus, standing 1. Stand on the edge of a step on the balls of your feet. The ball of your foot is on the walking surface, right under your toes. 2. Hold onto the railing for balance. 3. Slowly lift your left / right foot, allowing your body weight to press your left / right heel down over the edge of the step. You should feel a stretch in your left / right calf. 4. Hold this position for __________ seconds. Repeat __________ times with your left / right knee straight and __________ times with your left / right knee bent. Complete this stretch __________ times per day. Strengthening exercises These exercises improve the strength and endurance of your foot and ankle. Endurance is the ability to use your muscles for a long time, even after they get tired. Exercise B: Dorsiflexors  1. Secure a rubber  exercise band or tube to an object, like a table leg, that will not move if it is pulled on. 2. Secure the other end of the band around your left / right foot. 3. Sit on the floor, facing the object with your left / right foot extended. The band or tube should be slightly tense when your foot is relaxed. 4. Slowly flex your left / right ankle and toes to bring your foot toward you. 5. Hold this position for __________ seconds. 6. Slowly return your foot to the starting position. Repeat __________ times. Complete this exercise __________ times per day. Exercise C: Evertors 1. Sit on the floor with your legs straight out in front of you. 2. Loop a rubber exercise or band or tube around the ball of your left / right foot. The ball of your foot is on the walking surface, right under your toes. 3. Hold the ends of the band in your hands, or secure the band to a stable object. 4. Slowly push your foot outward, away from your other leg. 5. Hold this position for __________ seconds. 6. Slowly return your foot to the starting position. Repeat __________ times. Complete this exercise __________ times per day. Exercise D: Standing heel raise ( plantar flexion) 1. Stand with your feet shoulder-width apart with the balls of your feet on a step. The ball of your foot is on the walking surface, right under your toes. 2.  Keep your weight spread evenly over the width of your feet while you rise up on your toes. Use a wall or railing to steady yourself, but try not to use it for support. 3. If this exercise is too easy, try these options: ? Shift your weight toward your left / right leg until you feel challenged. ? If told by your health care provider, stand on your left / right leg only. 4. Hold this position for __________ seconds. Repeat __________ times. Complete this exercise __________ times per day. Exercise E: Single leg stand 1. Without shoes, stand near a railing or in a doorway. You may hold onto  the railing or door frame as needed. 2. Stand on your left / right foot. Keep your big toe down on the floor and try to keep your arch lifted. ? Do not roll to the outside of your foot. ? If this exercise is too easy, you can try it with your eyes closed or while standing on a pillow. 3. Hold this position for __________ seconds. Repeat __________ times. Complete this exercise __________ times per day. This information is not intended to replace advice given to you by your health care provider. Make sure you discuss any questions you have with your health care provider. Document Released: 08/28/2005 Document Revised: 05/04/2016 Document Reviewed: 07/17/2015 Elsevier Interactive Patient Education  [employer].

## 2018-05-01 NOTE — Progress Notes (Signed)
Ronald [name] is a 52 y.o. male who presents to [facility] today for follow-up left peroneal tendinitis.  Patient was seen on July 31 for left lateral ankle pain thought to be due to peroneal tendinitis on ultrasound.  He had a ultrasound-guided peroneal tendon injection and was treated with a Cam walker boot.  He notes near complete resolution in pain and symptoms.  He is weaned out of the boot since his last visit and notes only minimal pain with ambulation.  He has not yet started a home exercise program for peroneal tenonitis.     ROS:  As above  Exam:  BP 118/75   Pulse 85   Ht 5\' 11"  (1.803 m)   Wt 208 lb (94.3 kg)   BMI 29.01 kg/m  General: Well Developed, well nourished, and in no acute distress.  Neuro/Psych: Alert and oriented x3, extra-ocular muscles intact, able to move all 4 extremities, sensation grossly intact. Skin: Warm and dry, no rashes noted.  Respiratory: Not using accessory muscles, speaking in full sentences, trachea midline.  Cardiovascular: Pulses palpable, no extremity edema. Abdomen: Does not appear distended. MSK: Left ankle:  Normal appearing no significant swelling.  Non-tender at peroneal tendons.  Normal foot motion.  Strength is intact.  Minimal pain with resisted foot eversion.  Normal gait.     Assessment and Plan: 52 y.o. male with left Improving left peroneal tendonitis following injection and immobilization with cam walker.  D/C Cam walker and switch to ASO brace for 4-6 weeks.  Start home exercise program starting with eccentric exercises with elastic Thera-Band.  Progress to weightbearing eccentric exercises.  Handout provided.  Teaching using elastic band provided as well.  Recheck in about 6 weeks.  Return sooner if needed.  Patient declined flu vaccine today.  He thinks he might get it later this year but is not sure.  I spent 15 minutes with this patient, greater than 50% was  face-to-face time counseling regarding ddx, plan and home exercise program teaching.Marland Kitchen   Historical information moved to improve visibility of documentation.  Past Medical History:  Diagnosis Date  . Cancer (HCC)    Skin  . Hyperlipidemia 01/20/2015  . Schizoaffective disorder ([place]) 07/14/2008   Qualifier: Diagnosis of  By: [name], Barnetta Chapel    . Squamous cell skin cancer, ala nasi    Past Surgical History:  Procedure Laterality Date  . SKIN BIOPSY     Social History   Tobacco Use  . Smoking status: Former Smoker    Packs/day: 1.00    Types: Cigarettes, Cigars    Last attempt to quit: 10/30/2008    Years since quitting: 9.5  . Smokeless tobacco: Never Used  Substance Use Topics  . Alcohol use: Yes    Alcohol/week: 6.0 - 8.0 standard drinks    Types: 6 - 8 Cans of beer per week    Comment: non-alchoholic   family history includes Diabetes Mellitus II in his brother; Schizophrenia in his maternal uncle.  Medications: Current Outpatient Medications  Medication Sig Dispense Refill  . ARIPiprazole (ABILIFY) 10 MG tablet TAKE 1 TABLET BY MOUTH EVERY DAY 90 tablet 0  . FLUoxetine (PROZAC) 20 MG capsule TAKE 3 CAPSULES (60 MG TOTAL) BY MOUTH DAILY. 270 capsule 0  . lansoprazole (PREVACID) 30 MG capsule TAKE 1 CAPSULE (30 MG TOTAL) BY MOUTH AT BEDTIME. 90 capsule 3  . triamcinolone cream (KENALOG) 0.1 % Apply 1 application topically 2 (two) times daily. [place]  g 0   No current facility-administered medications for this visit.    Allergies  Allergen Reactions  . Bee Venom Swelling  . Penicillins       Discussed warning signs or symptoms. Please see discharge instructions. Patient expresses understanding.

## 2018-05-07 ENCOUNTER — Ambulatory Visit: Payer: BC Managed Care – PPO | Admitting: Family Medicine

## 2018-05-07 ENCOUNTER — Encounter: Payer: Self-pay | Admitting: Family Medicine

## 2018-05-07 VITALS — BP 118/66 | HR 74 | Ht 71.0 in | Wt 211.0 lb

## 2018-05-07 DIAGNOSIS — Z23 Encounter for immunization: Secondary | ICD-10-CM

## 2018-05-07 DIAGNOSIS — K2100 Gastro-esophageal reflux disease with esophagitis, without bleeding: Secondary | ICD-10-CM

## 2018-05-07 DIAGNOSIS — L989 Disorder of the skin and subcutaneous tissue, unspecified: Secondary | ICD-10-CM | POA: Diagnosis not present

## 2018-05-07 DIAGNOSIS — M25561 Pain in right knee: Secondary | ICD-10-CM | POA: Diagnosis not present

## 2018-05-07 MED ORDER — RANITIDINE HCL 150 MG PO CAPS: 150.0000 mg | ORAL_CAPSULE | Freq: Two times a day (BID) | ORAL | 3 refills | Status: DC

## 2018-05-07 NOTE — Patient Instructions (Signed)
Food Choices for Gastroesophageal Reflux Disease, Adult When you have gastroesophageal reflux disease (GERD), the foods you eat and your eating habits are very important. Choosing the right foods can help ease your discomfort. What guidelines do I need to follow?  Choose fruits, vegetables, whole grains, and low-fat dairy products.  Choose low-fat meat, fish, and poultry.  Limit fats such as oils, salad dressings, butter, nuts, and avocado.  Keep a food diary. This helps you identify foods that cause symptoms.  Avoid foods that cause symptoms. These may be different for everyone.  Eat small meals often instead of 3 large meals a day.  Eat your meals slowly, in a place where you are relaxed.  Limit fried foods.  Cook foods using methods other than frying.  Avoid drinking alcohol.  Avoid drinking large amounts of liquids with your meals.  Avoid bending over or lying down until 2-3 hours after eating. What foods are not recommended? These are some foods and drinks that may make your symptoms worse: Vegetables  Tomatoes. Tomato juice. Tomato and spaghetti sauce. Chili peppers. Onion and garlic. Horseradish. Fruits  Oranges, grapefruit, and lemon (fruit and juice). Meats  High-fat meats, fish, and poultry. This includes hot dogs, ribs, ham, sausage, salami, and bacon. Dairy  Whole milk and chocolate milk. Sour cream. Cream. Butter. Ice cream. Cream cheese. Drinks  Coffee and tea. Bubbly (carbonated) drinks or energy drinks. Condiments  Hot sauce. Barbecue sauce. Sweets/Desserts  Chocolate and cocoa. Donuts. Peppermint and spearmint. Fats and Oils  High-fat foods. This includes French fries and potato chips. Other  Vinegar. Strong spices. This includes black pepper, white pepper, red pepper, cayenne, curry powder, cloves, ginger, and chili powder. The items listed above may not be a complete list of foods and drinks to avoid. Contact your dietitian for more information.    This information is not intended to replace advice given to you by your health care provider. Make sure you discuss any questions you have with your health care provider. Document Released: 02/27/2012 Document Revised: 02/03/2016 Document Reviewed: 07/02/2013 Elsevier Interactive Patient Education  2017 Else[employer] Inc.  

## 2018-05-07 NOTE — Progress Notes (Signed)
Subjective:    Patient ID: Ronald [name], male    DOB: 09-Aug-1966, 52 y.o.   MRN: [id number]  HPI 52 year old male is here today for me to follow-up on couple skin lesions on his back.  I had seen him in January and in fact took a picture in my note we will get a follow-up just to make sure.  He did not end up going to urgent care recently for a little rash on his back.  He said use the triamcinolone  cream and it completely cleared up.  In regards to his reflux has been trying to take his PPI just as needed.  But he is still having to take it fairly frequently.  He does drink a lot of coffee during the day though he does switch to decaf in the afternoon.  He also c/o of right knee pain for about 2 months. He was walking down stairs when he felt his knee twist and had sudden pain on the outer right knee knee.  It is been sore ever since then more so if he goes to kneel or squat.  Sometimes also stepping up on stairs he will noticed the pain on the lateral knee.  He denies any swelling.  He is not currently taking any medications for it.  He denies any , locking or giving out of the joint.  He says occasionally he will some painless popping.  Review of Systems  BP 118/66   Pulse 74   Ht 5\' 11"  (1.803 m)   Wt 211 lb (95.7 kg)   SpO2 97%   BMI 29.43 kg/m     Allergies  Allergen Reactions  . Bee Venom Swelling  . Penicillins     Past Medical History:  Diagnosis Date  . Cancer (HCC)    Skin  . Hyperlipidemia 01/20/2015  . Schizoaffective disorder ([place]) 07/14/2008   Qualifier: Diagnosis of  By: [name], Barnetta Chapel    . Squamous cell skin cancer, ala nasi     Past Surgical History:  Procedure Laterality Date  . SKIN BIOPSY      Social History   Socioeconomic History  . Marital status: Single    Spouse name: Not on file  . Number of children: Not on file  . Years of education: Not on file  . Highest education level: Not on file  Occupational History  . Not on file   Social Needs  . Financial resource strain: Not on file  . Food insecurity:    Worry: Not on file    Inability: Not on file  . Transportation needs:    Medical: Not on file    Non-medical: Not on file  Tobacco Use  . Smoking status: Former Smoker    Packs/day: 1.00    Types: Cigarettes, Cigars    Last attempt to quit: 10/30/2008    Years since quitting: 9.5  . Smokeless tobacco: Never Used  Substance and Sexual Activity  . Alcohol use: Yes    Alcohol/week: 6.0 - 8.0 standard drinks    Types: 6 - 8 Cans of beer per week    Comment: non-alchoholic  . Drug use: No  . Sexual activity: Never  Lifestyle  . Physical activity:    Days per week: Not on file    Minutes per session: Not on file  . Stress: Not on file  Relationships  . Social connections:    Talks on phone: Not on file    Gets together: Not  on file    Attends religious service: Not on file    Active member of club or organization: Not on file    Attends meetings of clubs or organizations: Not on file    Relationship status: Not on file  . Intimate partner violence:    Fear of current or ex partner: Not on file    Emotionally abused: Not on file    Physically abused: Not on file    Forced sexual activity: Not on file  Other Topics Concern  . Not on file  Social History Narrative  . Not on file    Family History  Problem Relation Age of Onset  . Diabetes Mellitus II Brother   . Schizophrenia Maternal Uncle   . Alcohol abuse Neg Hx   . Anxiety disorder Neg Hx   . Bipolar disorder Neg Hx   . Dementia Neg Hx   . Depression Neg Hx   . Drug abuse Neg Hx   . Coronary artery disease Neg Hx     Outpatient Encounter Medications as of 05/07/2018  Medication Sig  . ARIPiprazole (ABILIFY) 10 MG tablet TAKE 1 TABLET BY MOUTH EVERY DAY  . FLUoxetine (PROZAC) 20 MG capsule TAKE 3 CAPSULES (60 MG TOTAL) BY MOUTH DAILY.  Marland Kitchen lansoprazole (PREVACID) 30 MG capsule TAKE 1 CAPSULE (30 MG TOTAL) BY MOUTH AT BEDTIME.  .  ranitidine (ZANTAC) 150 MG capsule Take 1 capsule (150 mg total) by mouth 2 (two) times daily.  . [DISCONTINUED] triamcinolone cream (KENALOG) 0.1 % Apply 1 application topically 2 (two) times daily.   No facility-administered encounter medications on file as of 05/07/2018.        Objective:   Physical Exam  Constitutional: He is oriented to person, place, and time. He appears well-developed and well-nourished.  HENT:  Head: Normocephalic and atraumatic.  Eyes: Conjunctivae and EOM are normal.  Cardiovascular: Normal rate.  Pulmonary/Chest: Effort normal.  Musculoskeletal:  Right knee with normal flexion and extension.  There is some significant crepitus under the patella.  Strength is 5 out of 5 at the hip knee and ankle.  Patellar reflex 1+.  Neurological: He is alert and oriented to person, place, and time.  Skin: Skin is dry. No pallor.  Lesion on his back that we originally took a photo back in January has completely resolved he does have a few scattered seborrheic keratoses.  Psychiatric: He has a normal mood and affect. His behavior is normal.  Vitals reviewed.      Assessment & Plan:  Skin lesion -the original skin lesion that we are following in January has actually resolved.  He does have a few scattered seborrheic keratoses.  They all look benign he does have a follow-up with dermatology in the fall also encouraged him to keep that appointment.  GERD - will try an H2 blocker which may be safer long term.  We discussed continuing to work on dietary measures.  He does drink a lot of coffee so encouraged him to really cut back on intake of coffee.  Right knee pain -today was unable to re-create his pain he actually has pretty good stability but I do think he could work on hip abductor strengthening which I do think would make a difference.  He is not currently exercising so encouraged to get back into the gym and really work on quad and hamstring strengthening as well.  If pain  returns then please let me know.  We can always get an x-ray  at that point in time if needed.  He is concerned because his mother had significant arthritis of her knees and hips.

## 2018-05-14 ENCOUNTER — Encounter (HOSPITAL_COMMUNITY): Payer: Self-pay | Admitting: Psychiatry

## 2018-05-14 ENCOUNTER — Ambulatory Visit (INDEPENDENT_AMBULATORY_CARE_PROVIDER_SITE_OTHER): Payer: BC Managed Care – PPO | Admitting: Psychiatry

## 2018-05-14 VITALS — BP 116/72 | HR 83 | Ht 70.0 in | Wt 208.0 lb

## 2018-05-14 DIAGNOSIS — F17210 Nicotine dependence, cigarettes, uncomplicated: Secondary | ICD-10-CM | POA: Diagnosis not present

## 2018-05-14 DIAGNOSIS — F258 Other schizoaffective disorders: Secondary | ICD-10-CM

## 2018-05-14 DIAGNOSIS — F411 Generalized anxiety disorder: Secondary | ICD-10-CM

## 2018-05-14 DIAGNOSIS — F259 Schizoaffective disorder, unspecified: Secondary | ICD-10-CM

## 2018-05-14 MED ORDER — FLUOXETINE HCL 20 MG PO CAPS: ORAL_CAPSULE | ORAL | 0 refills | Status: DC

## 2018-05-14 NOTE — Progress Notes (Signed)
Patient ID: Ronald Ronald [name], male   DOB: [birth year], 52 y.o.   MRN: [id number]   [place] Follow-up Outpatient Visit  Ronald Ronald [name] Feb 06, 1966 [id number] 52 y.o. 05/14/2018 4:34 PM  Chief Complaint:  HPI Comments: Ronald Ronald [name]  a 52 y/o male with a past psychiatric history significant for Schizoaffective Disorder and Generalized anxiety disorder. The patient returns for psychiatric services for medication management.  Brief history as per previous notes" Patient started having paranoid delusions in 2009"  saphris has helped in past.   Doing fair on abilify. Mom sick has to take care of her prozac helps depression   Modifying factor: meds  Past Medical Family, Social History:  Past Medical History:  Diagnosis Date  . Cancer (HCC)    Skin  . Hyperlipidemia 01/20/2015  . Schizoaffective disorder ([place]) 07/14/2008   Qualifier: Diagnosis of  By: [name], Barnetta Chapel    . Squamous cell skin cancer, ala nasi    Family History  Problem Relation Age of Onset  . Diabetes Mellitus II Brother   . Schizophrenia Maternal Uncle   . Alcohol abuse Neg Hx   . Anxiety disorder Neg Hx   . Bipolar disorder Neg Hx   . Dementia Neg Hx   . Depression Neg Hx   . Drug abuse Neg Hx   . Coronary artery disease Neg Hx    Social History   Socioeconomic History  . Marital status: Single    Spouse name: Not on file  . Number of children: Not on file  . Years of education: Not on file  . Highest education level: Not on file  Occupational History  . Not on file  Social Needs  . Financial resource strain: Not on file  . Food insecurity:    Worry: Not on file    Inability: Not on file  . Transportation needs:    Medical: Not on file    Non-medical: Not on file  Tobacco Use  . Smoking status: Current Some Day Smoker    Packs/day: 1.00    Types: Cigarettes, Cigars    Last attempt to quit: 10/30/2008    Years since quitting: 9.5  . Smokeless tobacco: Never Used  .  Tobacco comment: Smokes Cigars 1-2 several days a week.   Substance and Sexual Activity  . Alcohol use: Yes    Alcohol/week: 3.0 - 4.0 standard drinks    Types: 3 - 4 Cans of beer per week    Comment: non-alchoholic  . Drug use: No  . Sexual activity: Not Currently  Lifestyle  . Physical activity:    Days per week: Not on file    Minutes per session: Not on file  . Stress: Not on file  Relationships  . Social connections:    Talks on phone: Not on file    Gets together: Not on file    Attends religious service: Not on file    Active member of club or organization: Not on file    Attends meetings of clubs or organizations: Not on file    Relationship status: Not on file  Other Topics Concern  . Not on file  Social History Narrative  . Not on file    Outpatient Encounter Medications as of 05/14/2018  Medication Sig  . ARIPiprazole (ABILIFY) 10 MG tablet TAKE 1 TABLET BY MOUTH EVERY DAY  . FLUoxetine (PROZAC) 20 MG capsule TAKE 3 CAPSULES (60 MG TOTAL) BY MOUTH DAILY.  Marland Kitchen lansoprazole (PREVACID) 30 MG capsule  TAKE 1 CAPSULE (30 MG TOTAL) BY MOUTH AT BEDTIME.  . [DISCONTINUED] FLUoxetine (PROZAC) 20 MG capsule TAKE 3 CAPSULES (60 MG TOTAL) BY MOUTH DAILY.  . ranitidine (ZANTAC) 150 MG capsule Take 1 capsule (150 mg total) by mouth 2 (two) times daily. (Patient not taking: Reported on 05/14/2018)   No facility-administered encounter medications on file as of 05/14/2018.      Review of Systems  Cardiovascular: Negative for chest pain.  Skin: Negative for itching.  Neurological: Negative for tingling.  Psychiatric/Behavioral: Negative for depression, hallucinations and suicidal ideas. The patient Ronald [name] not nervous/anxious.    Vitals:   05/14/18 1625  BP: 116/72  Pulse: 83  SpO2: 95%  Weight: 208 lb (94.3 kg)  Height: 5\' 10"  (1.778 m)     Psychiatric Specialty Exam: General Appearance: Casual and Well Groomed  Eye Contact::  Fair  Speech:  Clear and Coherent and Normal Rate   Volume:  Normal  Mood: fair  Affect:  congruent  Thought Process:  Coherent, Linear and Logical  Orientation:  Full (Time, Place, and Person)  Thought Content:  WDL  Suicidal Thoughts:  No  Homicidal Thoughts:  No  Memory:  Immediate;   Good Recent;   Good Remote;   Good  Judgement:  Fair  Insight:  Fair  Psychomotor Activity:  Normal  Concentration:  Good  Recall:  Negative  Akathisia:  Negative  Language-Intact  Fund of Knowledge-Average  Handed:  Right  AIMS (if indicated):   As noted in chart.  Assets:  Communication Skills Desire for Improvement Financial Resources/Insurance Intimacy Leisure Time Physical Health Resilience Social Support Talents/Skills     Assessment: Schizoaffective Disorder  Axis I: Schizoaffective Disorder. GAD. Weight change  Plan:  1. Schizoaffective disorder: doing fair. Continue abilify.has meds for now  2. JME:[id number]. Continue prozac. Refill sent  reveiwed questions and renewed meds.fu 5m.     05/14/2018 4:34 PM

## 2018-06-12 ENCOUNTER — Encounter: Payer: Self-pay | Admitting: Family Medicine

## 2018-06-12 ENCOUNTER — Ambulatory Visit: Payer: BC Managed Care – PPO | Admitting: Family Medicine

## 2018-06-12 VITALS — BP 106/65 | HR 79 | Ht 71.0 in | Wt 206.0 lb

## 2018-06-12 DIAGNOSIS — K219 Gastro-esophageal reflux disease without esophagitis: Secondary | ICD-10-CM

## 2018-06-12 DIAGNOSIS — M7672 Peroneal tendinitis, left leg: Secondary | ICD-10-CM | POA: Diagnosis not present

## 2018-06-12 MED ORDER — FAMOTIDINE 40 MG PO TABS: 40.0000 mg | ORAL_TABLET | Freq: Every day | ORAL | 1 refills | Status: DC

## 2018-06-12 NOTE — Patient Instructions (Signed)
Thank you for coming in today.  STOP zantac Start generic prescription pepcid for acid reflux.  Recheck as needed for ankle.  Continue home exercises.  Return sooner if needed.

## 2018-06-13 DIAGNOSIS — K219 Gastro-esophageal reflux disease without esophagitis: Secondary | ICD-10-CM | POA: Insufficient documentation

## 2018-06-13 NOTE — Progress Notes (Signed)
Ronald [name] is a 52 y.o. male who presents to [facility] today for peroneal tendinitis and acid reflux/Zantac recall.  Ronald [name] has a history of left peroneal tendinitis.  He had injection and home exercise program.  He is feeling almost completely better.  He is happy with how things are going and is able to do his job in normal life activities without pain.  He has a history of acid reflux and previously was taking Zantac.  He notes this is been recalled and is worried about what he should do.  He has attempted to wean off of Prevacid due to potential risks of long-term PPI exposure.  For both Prevacid and Zantac he does have bothersome acid reflux symptoms.   ROS as above:  Exam:  BP 106/65   Pulse 79   Ht 5\' 11"  (1.803 m)   Wt 206 lb (93.4 kg)   BMI 28.73 kg/m  Wt Readings from Last 5 Encounters:  06/12/18 206 lb (93.4 kg)  05/07/18 211 lb (95.7 kg)  05/01/18 208 lb (94.3 kg)  04/18/18 217 lb (98.4 kg)  04/10/18 212 lb (96.2 kg)    Gen: Well NAD HEENT: EOMI,  MMM Lungs: Normal work of breathing. CTABL Heart: RRR no MRG Abd: NABS, Soft. Nondistended, Nontender Exts: Brisk capillary refill, warm and well perfused.  Left foot and ankle normal-appearing nontender normal motion normal strength.  Lab and Radiology Results No results found for this or any previous visit (from the past 72 hour(s)). No results found.    Assessment and Plan: 52 y.o. male with  Peroneal tendinitis: Significant improvement.  Continue home exercise program and compressive sleeve as needed.  Recheck as needed.  Reflux: Switch to famotidine.  If not well controlled restart PPI.  Recheck as needed.  I spent 15 minutes with this patient, greater than 50% was face-to-face time counseling regarding plan and recall of zantac  Meds ordered this encounter  Medications  .  famotidine (PEPCID) 40 MG tablet    Sig: Take 1 tablet (40 mg total) by mouth daily.    Dispense:  90 tablet    Refill:  1     Historical information moved to improve visibility of documentation.  Past Medical History:  Diagnosis Date  . Cancer (HCC)    Skin  . Hyperlipidemia 01/20/2015  . Schizoaffective disorder ([place]) 07/14/2008   Qualifier: Diagnosis of  By: [name], Barnetta Chapel    . Squamous cell skin cancer, ala nasi    Past Surgical History:  Procedure Laterality Date  . SKIN BIOPSY     Social History   Tobacco Use  . Smoking status: Current Some Day Smoker    Packs/day: 1.00    Types: Cigarettes, Cigars    Last attempt to quit: 10/30/2008    Years since quitting: 9.6  . Smokeless tobacco: Never Used  . Tobacco comment: Smokes Cigars 1-2 several days a week.   Substance Use Topics  . Alcohol use: Yes    Alcohol/week: 3.0 - 4.0 standard drinks    Types: 3 - 4 Cans of beer per week    Comment: non-alchoholic   family history includes Diabetes Mellitus II in his brother; Schizophrenia in his maternal uncle.  Medications: Current Outpatient Medications  Medication Sig Dispense Refill  . ARIPiprazole (ABILIFY) 10 MG tablet TAKE 1 TABLET BY MOUTH EVERY DAY 90 tablet 0  . FLUoxetine (PROZAC) 20 MG capsule TAKE 3 CAPSULES (  60 MG TOTAL) BY MOUTH DAILY. 270 capsule 0  . famotidine (PEPCID) 40 MG tablet Take 1 tablet (40 mg total) by mouth daily. 90 tablet 1   No current facility-administered medications for this visit.    Allergies  Allergen Reactions  . Bee Venom Swelling  . Penicillins      Discussed warning signs or symptoms. Please see discharge instructions. Patient expresses understanding.

## 2018-06-21 ENCOUNTER — Ambulatory Visit (INDEPENDENT_AMBULATORY_CARE_PROVIDER_SITE_OTHER): Payer: BC Managed Care – PPO | Admitting: Family Medicine

## 2018-06-21 ENCOUNTER — Encounter: Payer: Self-pay | Admitting: Family Medicine

## 2018-06-21 VITALS — BP 121/68 | HR 77 | Ht 71.0 in | Wt 208.0 lb

## 2018-06-21 DIAGNOSIS — H348112 Central retinal vein occlusion, right eye, stable: Secondary | ICD-10-CM | POA: Diagnosis not present

## 2018-06-21 DIAGNOSIS — R51 Headache: Secondary | ICD-10-CM

## 2018-06-21 DIAGNOSIS — R519 Headache, unspecified: Secondary | ICD-10-CM

## 2018-06-21 LAB — POCT GLYCOSYLATED HEMOGLOBIN (HGB A1C): HEMOGLOBIN A1C: 5.5 % (ref 4.0–5.6)

## 2018-06-21 NOTE — Progress Notes (Signed)
Subjective:    Patient ID: Ronald [name], male    DOB: 1966-06-23, 52 y.o.   MRN: [id number]  HPI 52 year old male comes in today for evaluation.  He was just seem [facility] by Dr. Hoyle Sauer and diagnosed with a right retinal vein branch occlusion.  She recommended additional work-up to evaluate for hypertensive disease, leukemia, retinopathy of anemia, diabetes and other possible autoimmune causes.  He overall is feeling well.  He had noticed a visual field defect in his right eye for about the last 3 months.  In fact he had mentioned it when he came in his last office visit and I had encouraged him to call his eye doctor to get in sooner as he Artie had a follow-up appointment.  He says he never did.  He has been having some occasional headaches but not frequent or intolerable.  He occasionally will take something for it.  He denies any fevers chills or sweats.  No night sweats.  He denies any significant worrisome joint pain or swelling of the joints.  He does have a history of GERD and said that he did run out of his medication and actually woke up a couple nights and vomited.  He says that is not unusual for him though if he forgets to take his medicine.  He denies any swollen glands or abdominal pain.  He feels like the area at his umbilicus is been getting a little bit bigger.  Review of Systems   BP 121/68   Pulse 77   Ht 5\' 11"  (1.803 m)   Wt 208 lb (94.3 kg)   SpO2 98%   BMI 29.01 kg/m     Allergies  Allergen Reactions  . Bee Venom Swelling  . Penicillins     Other reaction(s): Unknown Pt was told that he had a reaction when he was very young, he doesn't know if he has allergies or not    Past Medical History:  Diagnosis Date  . Cancer (HCC)    Skin  . Hyperlipidemia 01/20/2015  . Schizoaffective disorder ([place]) 07/14/2008   Qualifier: Diagnosis of  By: [name], Barnetta Chapel    . Squamous cell skin cancer, ala nasi     Past Surgical History:  Procedure  Laterality Date  . SKIN BIOPSY      Social History   Socioeconomic History  . Marital status: Single    Spouse name: Not on file  . Number of children: Not on file  . Years of education: Not on file  . Highest education level: Not on file  Occupational History  . Not on file  Social Needs  . Financial resource strain: Not on file  . Food insecurity:    Worry: Not on file    Inability: Not on file  . Transportation needs:    Medical: Not on file    Non-medical: Not on file  Tobacco Use  . Smoking status: Current Some Day Smoker    Packs/day: 1.00    Types: Cigarettes, Cigars    Last attempt to quit: 10/30/2008    Years since quitting: 9.6  . Smokeless tobacco: Never Used  . Tobacco comment: Smokes Cigars 1-2 several days a week.   Substance and Sexual Activity  . Alcohol use: Yes    Alcohol/week: 3.0 - 4.0 standard drinks    Types: 3 - 4 Cans of beer per week    Comment: non-alchoholic  . Drug use: No  . Sexual activity: Not  Currently  Lifestyle  . Physical activity:    Days per week: Not on file    Minutes per session: Not on file  . Stress: Not on file  Relationships  . Social connections:    Talks on phone: Not on file    Gets together: Not on file    Attends religious service: Not on file    Active member of club or organization: Not on file    Attends meetings of clubs or organizations: Not on file    Relationship status: Not on file  . Intimate partner violence:    Fear of current or ex partner: Not on file    Emotionally abused: Not on file    Physically abused: Not on file    Forced sexual activity: Not on file  Other Topics Concern  . Not on file  Social History Narrative  . Not on file    Family History  Problem Relation Age of Onset  . Diabetes Mellitus II Brother   . Schizophrenia Maternal Uncle   . Alcohol abuse Neg Hx   . Anxiety disorder Neg Hx   . Bipolar disorder Neg Hx   . Dementia Neg Hx   . Depression Neg Hx   . Drug abuse Neg Hx    . Coronary artery disease Neg Hx     Outpatient Encounter Medications as of 06/21/2018  Medication Sig  . ARIPiprazole (ABILIFY) 10 MG tablet TAKE 1 TABLET BY MOUTH EVERY DAY  . famotidine (PEPCID) 40 MG tablet Take 1 tablet (40 mg total) by mouth daily.  Marland Kitchen FLUoxetine (PROZAC) 20 MG capsule TAKE 3 CAPSULES (60 MG TOTAL) BY MOUTH DAILY.   No facility-administered encounter medications on file as of 06/21/2018.          Objective:   Physical Exam  Constitutional: He is oriented to person, place, and time. He appears well-developed and well-nourished.  HENT:  Head: Normocephalic and atraumatic.  Right Ear: External ear normal.  Left Ear: External ear normal.  Nose: Nose normal.  Mouth/Throat: Oropharynx is clear and moist.  TMs and canals are clear.   Eyes: Pupils are equal, round, and reactive to light. Conjunctivae and EOM are normal.  Neck: Neck supple. No thyromegaly present.  Cardiovascular: Normal rate and normal heart sounds.  Pulmonary/Chest: Effort normal and breath sounds normal.  Abdominal: Soft. Bowel sounds are normal. He exhibits no distension and no mass. There is no tenderness. There is no rebound and no guarding. No hernia.  + umbilical hernia.    Lymphadenopathy:    He has no cervical adenopathy.  Neurological: He is alert and oriented to person, place, and time.  Skin: Skin is warm and dry.  Psychiatric: He has a normal mood and affect.        Assessment & Plan:  Retinal vein branch occlusion, right -evaluate for potential causes including hypertension, leukemia, autoimmune disorders.  Hemoglobin A1c was normal so that rules out diabetes.  Will get PT/INR to evaluate for hypercoagulability.  Not be able to find a potential cause but we do need to rule some things out.  I will make sure to send a copy of these results over to Dr. Warren Lacy Harper's office.  Have a follow-up with her in 3 to 4 weeks to reexamine that right eye.  Umbilical hernia-he would like to  consider having it corrected.  He feels like it is actually been getting larger.  He said he could take some time off next summer so  encouraged him to call me in the spring and let me know and we will go ahead and place referral to general surgery at that time

## 2018-06-24 LAB — CBC WITH DIFFERENTIAL/PLATELET
BASOS ABS: 92 {cells}/uL (ref 0–200)
Basophils Relative: 1.4 %
EOS ABS: 178 {cells}/uL (ref 15–500)
Eosinophils Relative: 2.7 %
HEMATOCRIT: 46.8 % (ref 38.5–50.0)
HEMOGLOBIN: 16.6 g/dL (ref 13.2–17.1)
LYMPHS ABS: 1940 {cells}/uL (ref 850–3900)
MCH: 30 pg (ref 27.0–33.0)
MCHC: 35.5 g/dL (ref 32.0–36.0)
MCV: 84.6 fL (ref 80.0–100.0)
MONOS PCT: 6.5 %
MPV: 9 fL (ref 7.5–12.5)
NEUTROS PCT: 60 %
Neutro Abs: 3960 cells/uL (ref 1500–7800)
Platelets: 208 10*3/uL (ref 140–400)
RBC: 5.53 10*6/uL (ref 4.20–5.80)
RDW: 13.2 % (ref 11.0–15.0)
Total Lymphocyte: 29.4 %
WBC: 6.6 10*3/uL (ref 3.8–10.8)
WBCMIX: 429 {cells}/uL (ref 200–950)

## 2018-06-24 LAB — ANA: Anti Nuclear Antibody(ANA): NEGATIVE

## 2018-06-24 LAB — RPR: RPR Ser Ql: NONREACTIVE

## 2018-06-24 LAB — SEDIMENTATION RATE: SED RATE: 2 mm/h (ref 0–20)

## 2018-06-24 LAB — RHEUMATOID FACTOR

## 2018-06-24 LAB — HEMOGLOBIN A1C
Hgb A1c MFr Bld: 5.4 % of total Hgb (ref <5.7)
Mean Plasma Glucose: 108 (calc)
eAG (mmol/L): 6 (calc)

## 2018-06-24 LAB — HIV ANTIBODY (ROUTINE TESTING W REFLEX): HIV 1&2 Ab, 4th Generation: NONREACTIVE

## 2018-06-24 LAB — PROTIME-INR
INR: 1
PROTHROMBIN TIME: 10.6 s (ref 9.0–11.5)

## 2018-06-27 ENCOUNTER — Encounter: Payer: Self-pay | Admitting: Family Medicine

## 2018-07-03 ENCOUNTER — Other Ambulatory Visit (HOSPITAL_COMMUNITY): Payer: Self-pay | Admitting: Psychiatry

## 2018-07-21 ENCOUNTER — Other Ambulatory Visit: Payer: Self-pay

## 2018-07-21 ENCOUNTER — Emergency Department (INDEPENDENT_AMBULATORY_CARE_PROVIDER_SITE_OTHER)
Admission: EM | Admit: 2018-07-21 | Discharge: 2018-07-21 | Disposition: A | Discharge status: Home / Self Care | Payer: BC Managed Care – PPO | Source: Home / Self Care | Attending: Family Medicine | Admitting: Family Medicine

## 2018-07-21 DIAGNOSIS — J069 Acute upper respiratory infection, unspecified: Secondary | ICD-10-CM | POA: Diagnosis not present

## 2018-07-21 NOTE — ED Provider Notes (Signed)
[name]    CSN: [id number] Arrival date & time: 07/21/18  1745     History   Chief Complaint Chief Complaint  Patient presents with  . Generalized Body Aches    Clicking in LT ear    HPI Ronald [name] is a 52 y.o. male.   Patient noticed a vague intermittent clicking sensation in his left ear two days ago, now resolved.  Yesterday he developed increased sinus congestion, and today a mild sore throat with low grade fever to 99.  No cough.  He has had mild generalized myalgias.  The history is provided by the patient.    Past Medical History:  Diagnosis Date  . Cancer (HCC)    Skin  . Hyperlipidemia 01/20/2015  . Schizoaffective disorder ([place]) 07/14/2008   Qualifier: Diagnosis of  By: [name], Barnetta Chapel    . Squamous cell skin cancer, ala nasi     Patient Active Problem List   Diagnosis Date Noted  . GERD (gastroesophageal reflux disease) 06/13/2018  . Peroneal tendinitis of left lower extremity 04/10/2018  . Hyperlipidemia 01/20/2015  . History of basal cell cancer 01/16/2014  . MDD (major depressive disorder) 11/01/2013  . LACERATION 07/14/2010  . BACK PAIN, THORACIC REGION 12/22/2008  . FATIGUE 12/22/2008  . NIGHT SWEATS 10/27/2008  . Schizoaffective disorder ([place]) 07/14/2008    Past Surgical History:  Procedure Laterality Date  . SKIN BIOPSY         Home Medications    Prior to Admission medications   Medication Sig Start Date End Date Taking? Authorizing Provider  ARIPiprazole (ABILIFY) 10 MG tablet TAKE 1 TABLET BY MOUTH EVERY DAY 07/03/18   Merian Capron, MD  famotidine (PEPCID) 40 MG tablet Take 1 tablet (40 mg total) by mouth daily. 06/12/18   Gregor Hams, MD  FLUoxetine (PROZAC) 20 MG capsule TAKE 3 CAPSULES (60 MG TOTAL) BY MOUTH DAILY. 05/14/18   Merian Capron, MD    Family History Family History  Problem Relation Age of Onset  . Diabetes Mellitus II Brother   . Schizophrenia Maternal Uncle   . Alcohol abuse Neg  Hx   . Anxiety disorder Neg Hx   . Bipolar disorder Neg Hx   . Dementia Neg Hx   . Depression Neg Hx   . Drug abuse Neg Hx   . Coronary artery disease Neg Hx     Social History Social History   Tobacco Use  . Smoking status: Current Some Day Smoker    Packs/day: 1.00    Types: Cigarettes, Cigars    Last attempt to quit: 10/30/2008    Years since quitting: 9.7  . Smokeless tobacco: Never Used  . Tobacco comment: Smokes Cigars 1-2 several days a week.   Substance Use Topics  . Alcohol use: Yes    Alcohol/week: 3.0 - 4.0 standard drinks    Types: 3 - 4 Cans of beer per week    Comment: non-alchoholic  . Drug use: No     Allergies   Bee venom and Penicillins   Review of Systems Review of Systems + sore throat No cough No pleuritic pain No wheezing + nasal congestion + post-nasal drainage No sinus pain/pressure No itchy/red eyes ? earache No hemoptysis No SOB + low grade fever, No chills No nausea No vomiting No abdominal pain No diarrhea No urinary symptoms No skin rash + fatigue + myalgias No headache Used OTC meds without relief   Physical Exam Triage Vital Signs ED Triage  Vitals [07/21/18 1802]  Enc Vitals Group     BP 110/77     Pulse Rate 83     Resp 18     Temp 98.5 F (36.9 C)     Temp Source Oral     SpO2 97 %     Weight 209 lb (94.8 kg)     Height 5\' 11"  (1.803 m)     Head Circumference      Peak Flow      Pain Score 0     Pain Loc      Pain Edu?      Excl. in [place]?    No data found.  Updated Vital Signs BP 110/77 (BP Location: Right Arm)   Pulse 83   Temp 98.5 F (36.9 C) (Oral)   Resp 18   Ht 5\' 11"  (1.803 m)   Wt 94.8 kg   SpO2 97%   BMI 29.15 kg/m   Visual Acuity Right Eye Distance:   Left Eye Distance:   Bilateral Distance:    Right Eye Near:   Left Eye Near:    Bilateral Near:     Physical Exam Nursing notes and Vital Signs reviewed. Appearance:  Patient appears stated age, and in no acute distress Eyes:   Pupils are equal, round, and reactive to light and accomodation.  Extraocular movement is intact.  Conjunctivae are not inflamed  Ears:  Canals normal.  Tympanic membranes normal.  Nose:  Mildly congested turbinates.  No sinus tenderness.   Pharynx:  Normal Neck:  Supple.  Enlarged posterior/lateral nodes are palpated bilaterally, tender to palpation on the left.   Lungs:  Clear to auscultation.  Breath sounds are equal.  Moving air well. Heart:  Regular rate and rhythm without murmurs, rubs, or gallops.  Abdomen:  Nontender without masses or hepatosplenomegaly.  Bowel sounds are present.  No CVA or flank tenderness.  Extremities:  No edema.  Skin:  No rash present.    UC Treatments / Results  Labs (all labs ordered are listed, but only abnormal results are displayed) Labs Reviewed - No data to display  EKG None  Radiology No results found.  Procedures Procedures (including critical care time)  Medications Ordered in UC Medications - No data to display  Initial Impression / Assessment and Plan / UC Course  I have reviewed the triage vital signs and the nursing notes.  Pertinent labs & imaging results that were available during my care of the patient were reviewed by me and considered in my medical decision making (see chart for details).    Suspect early viral URI.  There is no evidence of bacterial infection today.   Treat symptomatically for now. Followup with Family Doctor if not improved in about 10 days.   Final Clinical Impressions(s) / UC Diagnoses   Final diagnoses:  Viral URI     Discharge Instructions     As increasing cold symptoms develop, try the following:  Take plain guaifenesin (1200mg  extended release tabs such as Mucinex) twice daily, with plenty of water, for cough and congestion.  May add Pseudoephedrine (30mg , one or two every 4 to 6 hours) for sinus congestion.  Get adequate rest.   May use Afrin nasal spray (or generic oxymetazoline) each  morning for about 5 days and then discontinue.  Also recommend using saline nasal spray several times daily and saline nasal irrigation (AYR is a common brand).  Use Flonase nasal spray each morning after using Afrin nasal spray  and saline nasal irrigation. Try warm salt water gargles for sore throat.  Stop all antihistamines for now, and other non-prescription cough/cold preparations. May take Ibuprofen 200mg , 4 tabs every 8 hours with food for sore throat, headache, etc. May take Delsym Cough Suppressant at bedtime for nighttime cough.     ED Prescriptions    None        Kandra Nicolas, MD 07/22/18 1500

## 2018-07-21 NOTE — Discharge Instructions (Addendum)
As increasing cold symptoms develop, try the following:  Take plain guaifenesin (1200mg  extended release tabs such as Mucinex) twice daily, with plenty of water, for cough and congestion.  May add Pseudoephedrine (30mg , one or two every 4 to 6 hours) for sinus congestion.  Get adequate rest.   May use Afrin nasal spray (or generic oxymetazoline) each morning for about 5 days and then discontinue.  Also recommend using saline nasal spray several times daily and saline nasal irrigation (AYR is a common brand).  Use Flonase nasal spray each morning after using Afrin nasal spray and saline nasal irrigation. Try warm salt water gargles for sore throat.  Stop all antihistamines for now, and other non-prescription cough/cold preparations. May take Ibuprofen 200mg , 4 tabs every 8 hours with food for sore throat, headache, etc. May take Delsym Cough Suppressant at bedtime for nighttime cough.

## 2018-07-21 NOTE — ED Triage Notes (Signed)
Pt c/o dry scratchy throat and body aches that started today. Also c/o a clicking nose in his LT ear and feeling swelling in his lymph nodes.

## 2018-09-12 ENCOUNTER — Other Ambulatory Visit (HOSPITAL_COMMUNITY): Payer: Self-pay | Admitting: Psychiatry

## 2018-09-16 ENCOUNTER — Ambulatory Visit (INDEPENDENT_AMBULATORY_CARE_PROVIDER_SITE_OTHER): Payer: BC Managed Care – PPO | Admitting: Psychiatry

## 2018-09-16 ENCOUNTER — Encounter (HOSPITAL_COMMUNITY): Payer: Self-pay | Admitting: Psychiatry

## 2018-09-16 VITALS — BP 132/88 | Ht 71.0 in | Wt 215.0 lb

## 2018-09-16 DIAGNOSIS — F411 Generalized anxiety disorder: Secondary | ICD-10-CM

## 2018-09-16 DIAGNOSIS — F258 Other schizoaffective disorders: Secondary | ICD-10-CM | POA: Diagnosis not present

## 2018-09-16 DIAGNOSIS — F259 Schizoaffective disorder, unspecified: Secondary | ICD-10-CM

## 2018-09-16 MED ORDER — ARIPIPRAZOLE 10 MG PO TABS: 10.0000 mg | ORAL_TABLET | Freq: Every day | ORAL | 0 refills | Status: DC

## 2018-09-16 NOTE — Progress Notes (Signed)
Patient ID: Ronald [name], male   DOB: 26-Apr-1966, 53 y.o.   MRN: [id number]   [place] Follow-up Outpatient Visit  DAKODA LAVENTURE [birth year] [id number] 53 y.o. 09/16/2018 4:37 PM  Chief Complaint:  HPI Comments: Mr. Waas is  a 53 y/o male with a past psychiatric history significant for Schizoaffective Disorder and Generalized anxiety disorder. The patient returns for psychiatric services for medication management.  Brief history as per previous notes" Patient started having paranoid delusions in 2009"  saphris has helped in past.   Doing fair on abilify. No tremors. Tolerating meds.had a vacation and better time   prozac helps depression  Denies paranoia, he distract when this happens  Modifying factor: meds and family  Past Medical Family, Social History:  Past Medical History:  Diagnosis Date  . Cancer (HCC)    Skin  . Hyperlipidemia 01/20/2015  . Schizoaffective disorder ([place]) 07/14/2008   Qualifier: Diagnosis of  By: [name], Barnetta Chapel    . Squamous cell skin cancer, ala nasi    Family History  Problem Relation Age of Onset  . Diabetes Mellitus II Brother   . Schizophrenia Maternal Uncle   . Alcohol abuse Neg Hx   . Anxiety disorder Neg Hx   . Bipolar disorder Neg Hx   . Dementia Neg Hx   . Depression Neg Hx   . Drug abuse Neg Hx   . Coronary artery disease Neg Hx    Social History   Socioeconomic History  . Marital status: Single    Spouse name: Not on file  . Number of children: Not on file  . Years of education: Not on file  . Highest education level: Not on file  Occupational History  . Not on file  Social Needs  . Financial resource strain: Not on file  . Food insecurity:    Worry: Not on file    Inability: Not on file  . Transportation needs:    Medical: Not on file    Non-medical: Not on file  Tobacco Use  . Smoking status: Current Some Day Smoker    Packs/day: 1.00    Types: Cigarettes, Cigars    Last attempt to  quit: 10/30/2008    Years since quitting: 9.8  . Smokeless tobacco: Never Used  . Tobacco comment: Smokes Cigars 1-2 several days a week.   Substance and Sexual Activity  . Alcohol use: Yes    Alcohol/week: 3.0 - 4.0 standard drinks    Types: 3 - 4 Cans of beer per week    Comment: non-alchoholic  . Drug use: No  . Sexual activity: Not Currently  Lifestyle  . Physical activity:    Days per week: Not on file    Minutes per session: Not on file  . Stress: Not on file  Relationships  . Social connections:    Talks on phone: Not on file    Gets together: Not on file    Attends religious service: Not on file    Active member of club or organization: Not on file    Attends meetings of clubs or organizations: Not on file    Relationship status: Not on file  Other Topics Concern  . Not on file  Social History Narrative  . Not on file    Outpatient Encounter Medications as of 09/16/2018  Medication Sig  . ARIPiprazole (ABILIFY) 10 MG tablet Take 1 tablet (10 mg total) by mouth daily.  . famotidine (PEPCID) 40 MG tablet Take 1  tablet (40 mg total) by mouth daily.  . famotidine (PEPCID) 40 MG tablet Take by mouth.  Marland Kitchen FLUoxetine (PROZAC) 20 MG capsule TAKE 3 CAPSULES BY MOUTH EVERY DAY  . [DISCONTINUED] ARIPiprazole (ABILIFY) 10 MG tablet TAKE 1 TABLET BY MOUTH EVERY DAY   No facility-administered encounter medications on file as of 09/16/2018.      Review of Systems  Cardiovascular: Negative for chest pain.  Skin: Negative for itching.  Neurological: Negative for tingling.  Psychiatric/Behavioral: Negative for depression, hallucinations and suicidal ideas. The patient is not nervous/anxious.    Vitals:   09/16/18 1629  BP: 132/88  Weight: 215 lb (97.5 kg)  Height: 5\' 11"  (1.803 m)     Psychiatric Specialty Exam: General Appearance: Casual and Well Groomed  Eye Contact::  Fair  Speech:  Clear and Coherent and Normal Rate  Volume:  Normal  Mood: fair  Affect:  congruent   Thought Process:  Coherent, Linear and Logical  Orientation:  Full (Time, Place, and Person)  Thought Content:  WDL  Suicidal Thoughts:  No  Homicidal Thoughts:  No  Memory:  Immediate;   Good Recent;   Good Remote;   Good  Judgement:  Fair  Insight:  Fair  Psychomotor Activity:  Normal  Concentration:  Good  Recall:  Negative  Akathisia:  Negative  Language-Intact  Fund of Knowledge-Average  Handed:  Right  AIMS (if indicated):   As noted in chart.  Assets:  Communication Skills Desire for Improvement Financial Resources/Insurance Intimacy Leisure Time Physical Health Resilience Social Support Talents/Skills     Assessment: Schizoaffective Disorder  Axis I: Schizoaffective Disorder. GAD. Weight change  Plan:  1. Schizoaffective disorder: doing fair. Continue abilify Refill sent 2. GAD: manageable. Continue prozac   reveiwed questions and renewed meds.fu 71m.    09/16/2018 4:37 PM

## 2018-09-23 ENCOUNTER — Other Ambulatory Visit (HOSPITAL_COMMUNITY): Payer: Self-pay | Admitting: Psychiatry

## 2018-09-25 ENCOUNTER — Other Ambulatory Visit (HOSPITAL_COMMUNITY): Payer: Self-pay | Admitting: Psychiatry

## 2018-10-17 ENCOUNTER — Ambulatory Visit: Payer: BC Managed Care – PPO | Admitting: Family Medicine

## 2018-10-24 ENCOUNTER — Ambulatory Visit: Payer: BC Managed Care – PPO | Admitting: Family Medicine

## 2018-10-24 ENCOUNTER — Encounter: Payer: Self-pay | Admitting: Family Medicine

## 2018-10-24 VITALS — BP 139/81 | HR 86 | Ht 71.0 in | Wt 217.0 lb

## 2018-10-24 DIAGNOSIS — K2100 Gastro-esophageal reflux disease with esophagitis, without bleeding: Secondary | ICD-10-CM

## 2018-10-24 DIAGNOSIS — D229 Melanocytic nevi, unspecified: Secondary | ICD-10-CM | POA: Diagnosis not present

## 2018-10-24 DIAGNOSIS — R21 Rash and other nonspecific skin eruption: Secondary | ICD-10-CM | POA: Diagnosis not present

## 2018-10-24 MED ORDER — FAMOTIDINE 40 MG PO TABS: 40.0000 mg | ORAL_TABLET | Freq: Every day | ORAL | 1 refills | Status: DC

## 2018-10-24 NOTE — Progress Notes (Signed)
Established Patient Office Visit  Subjective:  Patient ID: Ronald [name], male    DOB: 1966-07-02  Age: 53 y.o. MRN: [id number]  CC:  Chief Complaint  Patient presents with  . Rash    R palm of hand. he reports that the area has gotten better. he works in Software engineer, he does wear gloves    HPI [name] presents for skin rash on his left hand.  He is a Secretary/administrator for profession.  He normally wears gloves.  He says he is just been keeping it clean and covered.  It happened about 2 weeks ago.  It only affected a spot at the base of his left hand near the wrist and palm.  He wants me to check the moles on his back.  He has 1 particularly on the right upper back near his shoulder that he would like me to look at today.  He did see his dermatologist in December.  Also he is planning on having hernia repair surgery for umbilical hernia in the summer.  He says is easier for him to get off of work.  GERD-his reflux has been better.  He did skip his famotidine a couple times just to see if he noticed a difference and he said he still felt good.  But he has been taking it regularly.  Past Medical History:  Diagnosis Date  . Cancer (HCC)    Skin  . Hyperlipidemia 01/20/2015  . Schizoaffective disorder ([place]) 07/14/2008   Qualifier: Diagnosis of  By: [name], Barnetta Chapel    . Squamous cell skin cancer, ala nasi     Past Surgical History:  Procedure Laterality Date  . SKIN BIOPSY      Family History  Problem Relation Age of Onset  . Diabetes Mellitus II Brother   . Schizophrenia Maternal Uncle   . Alcohol abuse Neg Hx   . Anxiety disorder Neg Hx   . Bipolar disorder Neg Hx   . Dementia Neg Hx   . Depression Neg Hx   . Drug abuse Neg Hx   . Coronary artery disease Neg Hx     Social History   Socioeconomic History  . Marital status: Single    Spouse name: Not on file  . Number of children: Not on file  . Years of education: Not on file  .  Highest education level: Not on file  Occupational History  . Not on file  Social Needs  . Financial resource strain: Not on file  . Food insecurity:    Worry: Not on file    Inability: Not on file  . Transportation needs:    Medical: Not on file    Non-medical: Not on file  Tobacco Use  . Smoking status: Current Some Day Smoker    Packs/day: 1.00    Types: Cigarettes, Cigars    Last attempt to quit: 10/30/2008    Years since quitting: 9.9  . Smokeless tobacco: Never Used  . Tobacco comment: Smokes Cigars 1-2 several days a week.   Substance and Sexual Activity  . Alcohol use: Yes    Alcohol/week: 3.0 - 4.0 standard drinks    Types: 3 - 4 Cans of beer per week    Comment: non-alchoholic  . Drug use: No  . Sexual activity: Not Currently  Lifestyle  . Physical activity:    Days per week: Not on file    Minutes per session: Not on file  . Stress: Not  on file  Relationships  . Social connections:    Talks on phone: Not on file    Gets together: Not on file    Attends religious service: Not on file    Active member of club or organization: Not on file    Attends meetings of clubs or organizations: Not on file    Relationship status: Not on file  . Intimate partner violence:    Fear of current or ex partner: Not on file    Emotionally abused: Not on file    Physically abused: Not on file    Forced sexual activity: Not on file  Other Topics Concern  . Not on file  Social History Narrative  . Not on file    Outpatient Medications Prior to Visit  Medication Sig Dispense Refill  . ARIPiprazole (ABILIFY) 10 MG tablet Take 1 tablet (10 mg total) by mouth daily. 90 tablet 0  . FLUoxetine (PROZAC) 20 MG capsule TAKE 3 CAPSULES BY MOUTH EVERY DAY 270 capsule 0  . famotidine (PEPCID) 40 MG tablet Take 1 tablet (40 mg total) by mouth daily. 90 tablet 1  . famotidine (PEPCID) 40 MG tablet Take by mouth.     No facility-administered medications prior to visit.     Allergies   Allergen Reactions  . Bee Venom Swelling  . Penicillins     Other reaction(s): Unknown Pt was told that he had a reaction when he was very young, he doesn't know if he has allergies or not    ROS Review of Systems    Objective:    Physical Exam  BP 139/81   Pulse 86   Ht 5\' 11"  (1.803 m)   Wt 217 lb (98.4 kg)   SpO2 98%   BMI 30.27 kg/m  Wt Readings from Last 3 Encounters:  10/24/18 217 lb (98.4 kg)  07/21/18 209 lb (94.8 kg)  06/21/18 208 lb (94.3 kg)     There are no preventive care reminders to display for this patient.  There are no preventive care reminders to display for this patient.  Lab Results  Component Value Date   TSH 0.835 07/14/2008   Lab Results  Component Value Date   WBC 6.6 06/21/2018   HGB 16.6 06/21/2018   HCT 46.8 06/21/2018   MCV 84.6 06/21/2018   PLT 208 06/21/2018   Lab Results  Component Value Date   NA 137 03/20/2018   K 4.5 03/20/2018   CO2 30 03/20/2018   GLUCOSE 94 03/20/2018   BUN 17 03/20/2018   CREATININE 1.14 03/20/2018   BILITOT 0.4 03/20/2018   ALKPHOS 56 07/14/2008   AST 19 03/20/2018   ALT 19 03/20/2018   PROT 6.7 03/20/2018   ALBUMIN 4.9 07/14/2008   CALCIUM 9.6 03/20/2018   No results found for: [facility] Lab Results  Component Value Date   HDL 43 05/05/2016   No results found for: [facility] Lab Results  Component Value Date   TRIG 100 05/05/2016   Lab Results  Component Value Date   CHOLHDL 4.4 05/05/2016   Lab Results  Component Value Date   HGBA1C 5.4 06/21/2018      Assessment & Plan:   Problem List Items Addressed This Visit      Digestive   GERD (gastroesophageal reflux disease)   Relevant Medications   famotidine (PEPCID) 40 MG tablet    Other Visit Diagnoses    Skin rash    -  Primary   Nevus  Skin rash-it looks like it was likely some type of chemical burn or either contact dermatitis.  It seems to be healing just fine.  No open wound or drainage.  Nevus-skin lesion on  right upper back looks benign under the microscope.  No other worrisome skin findings.  Keep regular appointment with dermatology.  GERD-I did go ahead and refill the famotidine but certainly if he feels like his symptoms are under good control he can actually try discontinuing the medication and using it more as needed.  Meds ordered this encounter  Medications  . famotidine (PEPCID) 40 MG tablet    Sig: Take 1 tablet (40 mg total) by mouth daily.    Dispense:  90 tablet    Refill:  1    Follow-up: Return if symptoms worsen or fail to improve.    Beatrice Lecher, MD

## 2018-11-23 IMAGING — DX DG ANKLE COMPLETE 3+V*L*
3 series · 3 of 3 positions shown · non-contrast
Comparison: None

CLINICAL DATA: Lateral LEFT ankle pain, swelling and instability
since [REDACTED], denies injury

EXAM:
LEFT ANKLE COMPLETE - 3+ VIEW

[ankle ap]
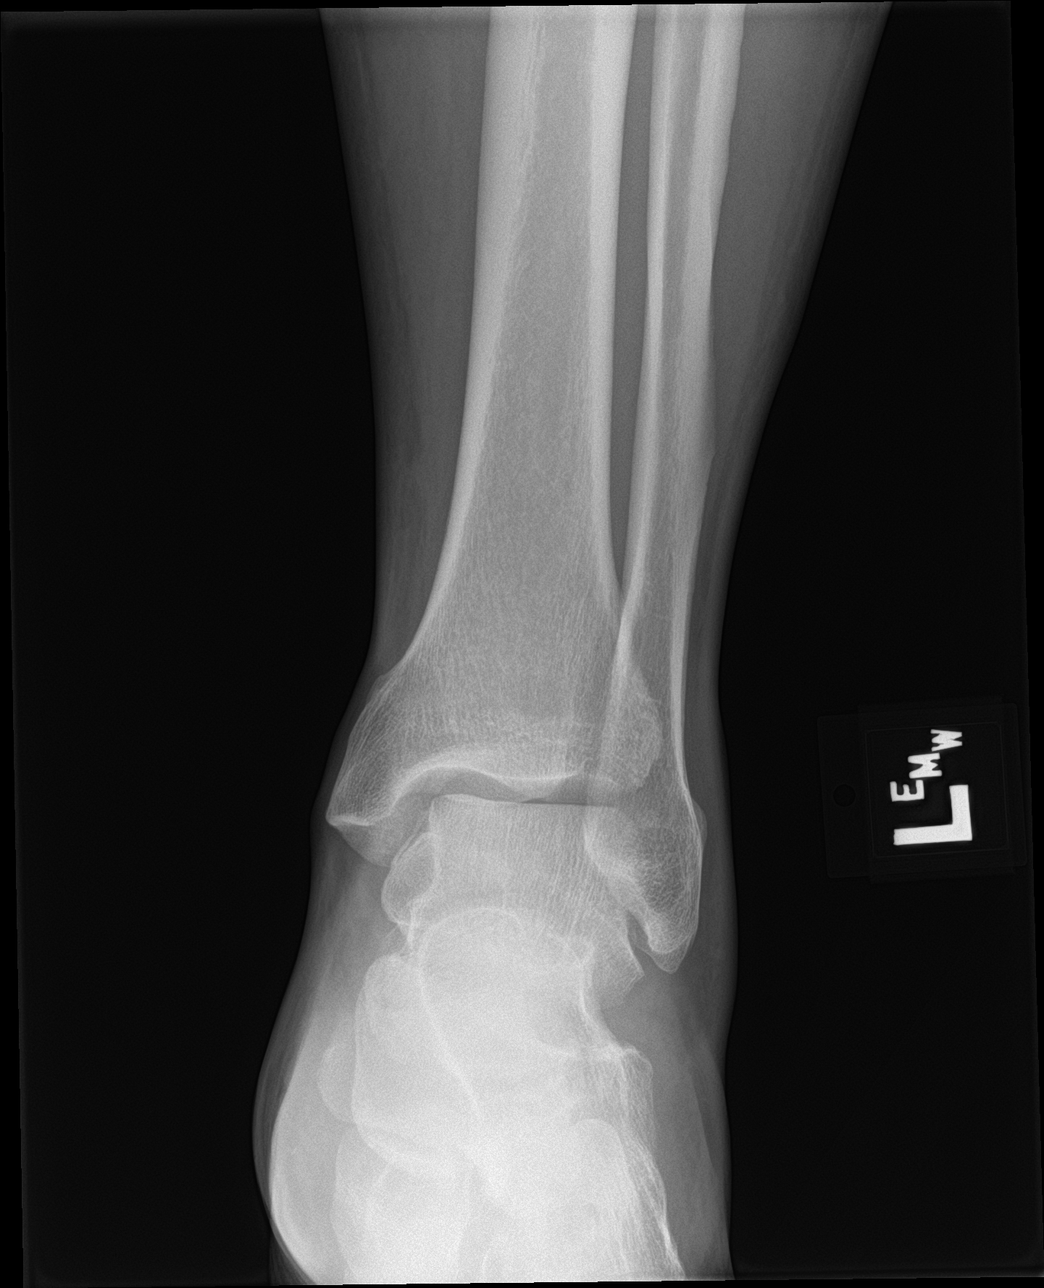

[ankle obl]
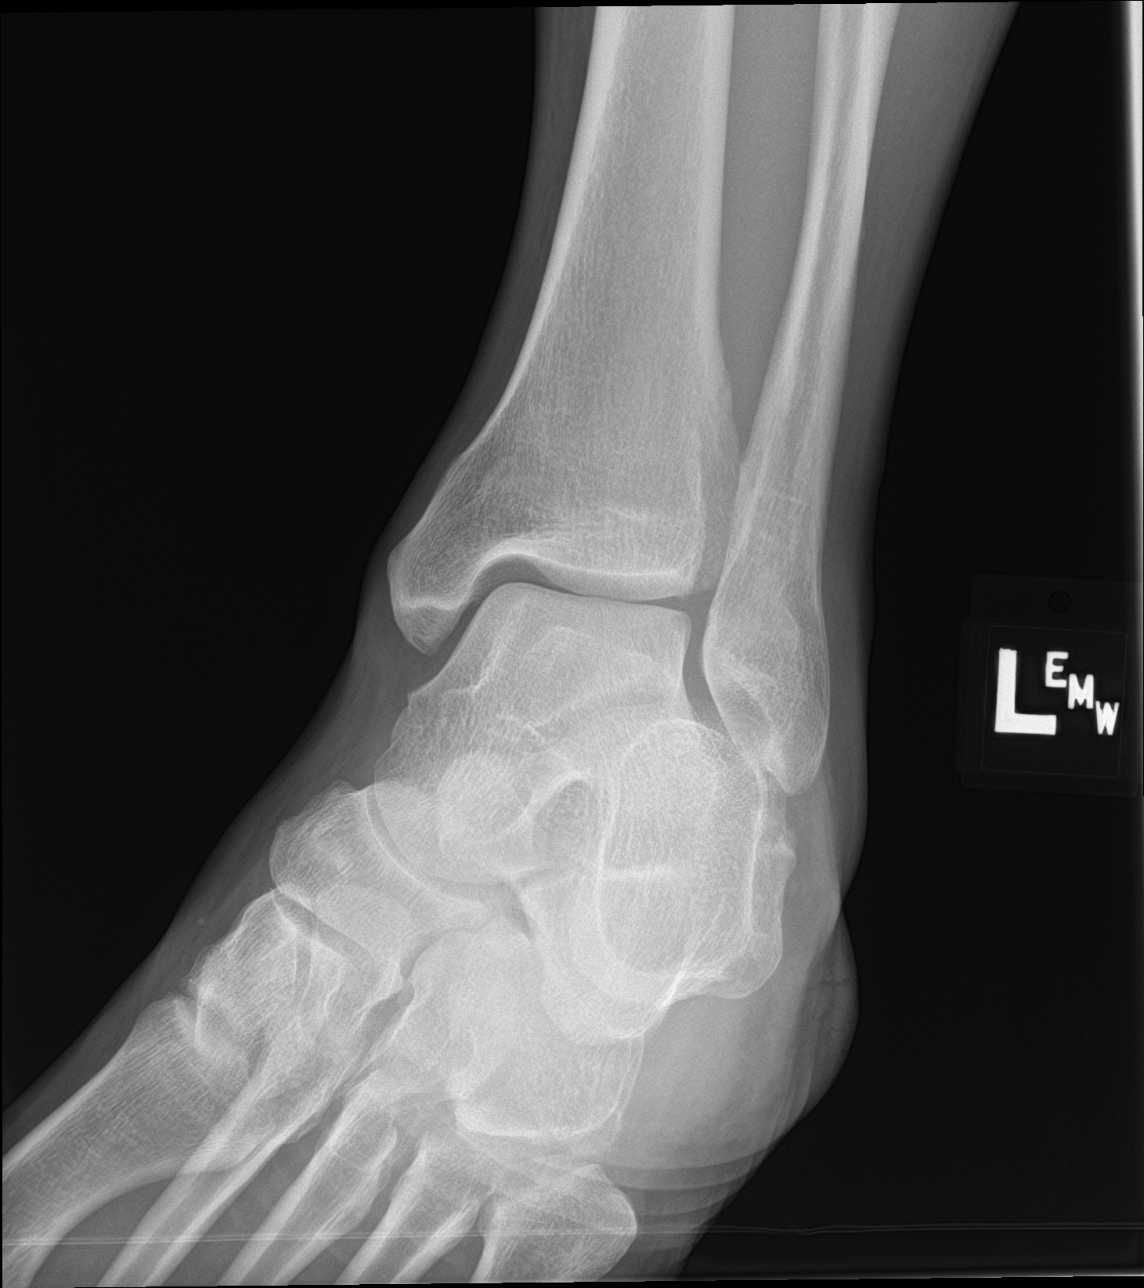

[ankle lat]
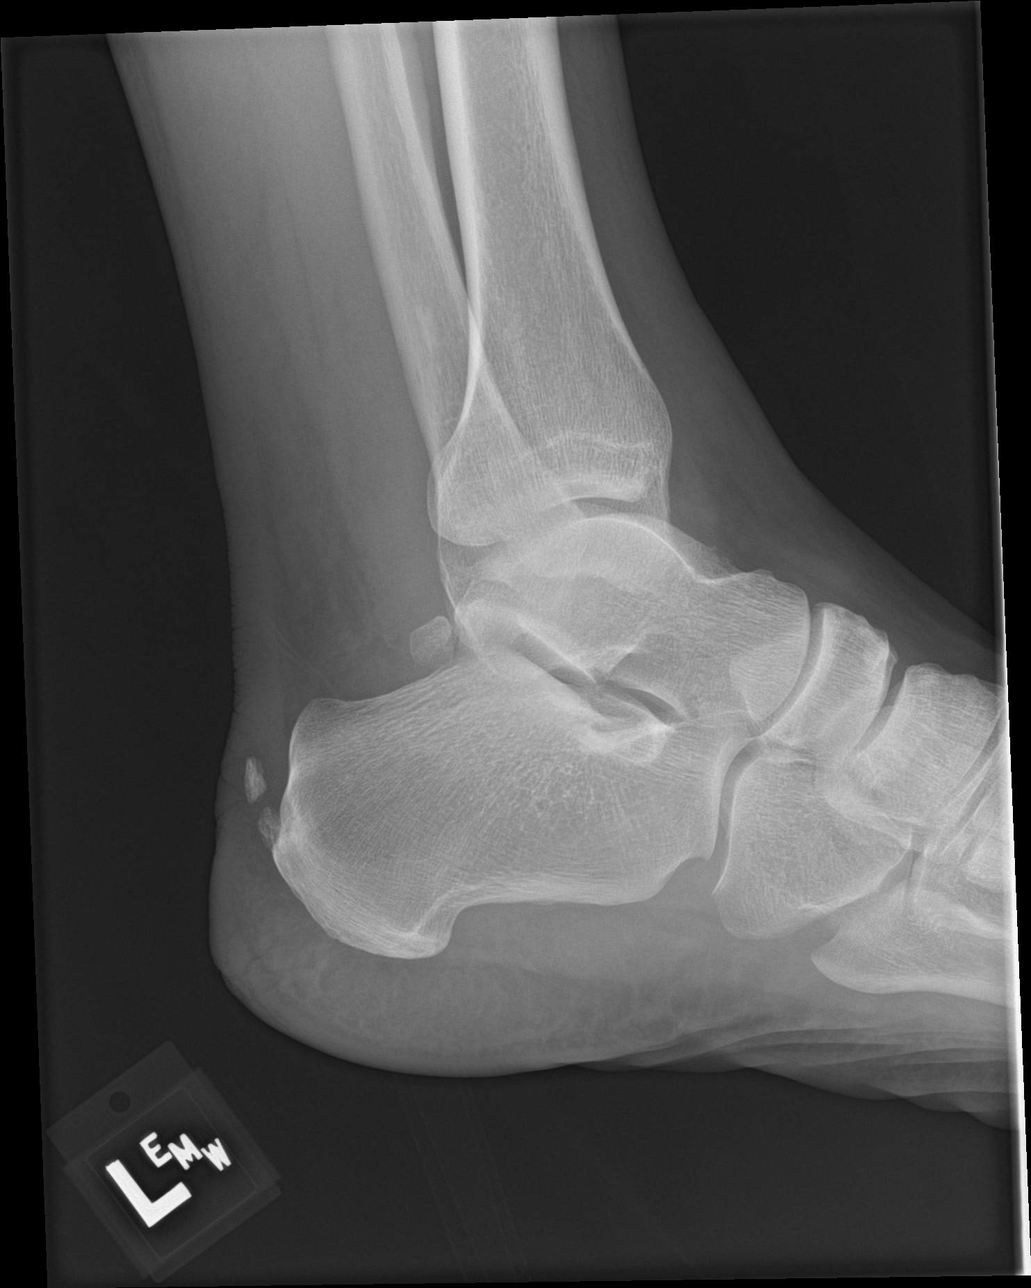

[3 of 3 positions shown; findings below may reference images not displayed]

FINDINGS: Lateral soft tissue swelling.

Osseous mineralization normal.

Ankle joint space preserved.

No acute fracture, dislocation, or bone destruction.

Calcification at distal Achilles tendon and insertion site.
IMPRESSION: No acute osseous abnormalities.

Distal Achilles tendon calcification which may reflect tendinopathy
or prior injury.

## 2018-11-25 ENCOUNTER — Ambulatory Visit (INDEPENDENT_AMBULATORY_CARE_PROVIDER_SITE_OTHER): Payer: BC Managed Care – PPO | Admitting: Family Medicine

## 2018-11-25 ENCOUNTER — Other Ambulatory Visit: Payer: Self-pay

## 2018-11-25 ENCOUNTER — Encounter: Payer: Self-pay | Admitting: Family Medicine

## 2018-11-25 VITALS — BP 136/82 | HR 87 | Temp 98.9°F | Ht 71.0 in | Wt 214.0 lb

## 2018-11-25 DIAGNOSIS — J069 Acute upper respiratory infection, unspecified: Secondary | ICD-10-CM | POA: Diagnosis not present

## 2018-11-25 NOTE — Patient Instructions (Addendum)

## 2018-11-25 NOTE — Progress Notes (Signed)
Acute Office Visit  Subjective:    Patient ID: Ronald [name], male    DOB: 1966-06-08, 53 y.o.   MRN: [id number]  Chief Complaint  Patient presents with  . Sore Throat    pt reports that his sxs began this morning. he feels that his lymph nodes are swollen  . Headache    at the base of his head and sometimes frontal    HPI Patient is in today for nasal congestion, runny nose. ST and HA at the base of his school and frontal that started this morning.  He works as a Electrical engineer at [employer]. No  fever, sweats or chills or bodyaches.  No cough.  BP was a little elevated when he first woke up this morning.  But it seems to have settled back down.  He is not currently taking any medications he just wanted to make sure before he returned to work that he was safe to do so.  Past Medical History:  Diagnosis Date  . Cancer (HCC)    Skin  . Hyperlipidemia 01/20/2015  . Schizoaffective disorder ([place]) 07/14/2008   Qualifier: Diagnosis of  By: [name], Barnetta Chapel    . Squamous cell skin cancer, ala nasi     Past Surgical History:  Procedure Laterality Date  . SKIN BIOPSY      Family History  Problem Relation Age of Onset  . Diabetes Mellitus II Brother   . Schizophrenia Maternal Uncle   . Alcohol abuse Neg Hx   . Anxiety disorder Neg Hx   . Bipolar disorder Neg Hx   . Dementia Neg Hx   . Depression Neg Hx   . Drug abuse Neg Hx   . Coronary artery disease Neg Hx     Social History   Socioeconomic History  . Marital status: Single    Spouse name: Not on file  . Number of children: Not on file  . Years of education: Not on file  . Highest education level: Not on file  Occupational History  . Not on file  Social Needs  . Financial resource strain: Not on file  . Food insecurity:    Worry: Not on file    Inability: Not on file  . Transportation needs:    Medical: Not on file    Non-medical: Not on file  Tobacco Use  . Smoking status: Current Some Day Smoker     Packs/day: 1.00    Types: Cigarettes, Cigars    Last attempt to quit: 10/30/2008    Years since quitting: 10.0  . Smokeless tobacco: Never Used  . Tobacco comment: Smokes Cigars 1-2 several days a week.   Substance and Sexual Activity  . Alcohol use: Yes    Alcohol/week: 3.0 - 4.0 standard drinks    Types: 3 - 4 Cans of beer per week    Comment: non-alchoholic  . Drug use: No  . Sexual activity: Not Currently  Lifestyle  . Physical activity:    Days per week: Not on file    Minutes per session: Not on file  . Stress: Not on file  Relationships  . Social connections:    Talks on phone: Not on file    Gets together: Not on file    Attends religious service: Not on file    Active member of club or organization: Not on file    Attends meetings of clubs or organizations: Not on file    Relationship status: Not on file  .  Intimate partner violence:    Fear of current or ex partner: Not on file    Emotionally abused: Not on file    Physically abused: Not on file    Forced sexual activity: Not on file  Other Topics Concern  . Not on file  Social History Narrative  . Not on file    Outpatient Medications Prior to Visit  Medication Sig Dispense Refill  . ARIPiprazole (ABILIFY) 10 MG tablet Take 1 tablet (10 mg total) by mouth daily. 90 tablet 0  . famotidine (PEPCID) 40 MG tablet Take 1 tablet (40 mg total) by mouth daily. 90 tablet 1  . FLUoxetine (PROZAC) 20 MG capsule TAKE 3 CAPSULES BY MOUTH EVERY DAY 270 capsule 0   No facility-administered medications prior to visit.     Allergies  Allergen Reactions  . Bee Venom Swelling  . Penicillins     Other reaction(s): Unknown Pt was told that he had a reaction when he was very young, he doesn't know if he has allergies or not    ROS     Objective:    Physical Exam  Constitutional: He is oriented to person, place, and time. He appears well-developed and well-nourished.  HENT:  Head: Normocephalic and atraumatic.   Right Ear: External ear normal.  Left Ear: External ear normal.  Nose: Nose normal.  Mouth/Throat: Oropharynx is clear and moist.  TMs and canals are clear.   Eyes: Pupils are equal, round, and reactive to light. Conjunctivae and EOM are normal.  Neck: Neck supple. No thyromegaly present.  Cardiovascular: Normal rate and normal heart sounds.  Pulmonary/Chest: Effort normal and breath sounds normal.  Lymphadenopathy:    He has no cervical adenopathy.  Neurological: He is alert and oriented to person, place, and time.  Skin: Skin is warm and dry.  Psychiatric: He has a normal mood and affect.    BP 136/82   Pulse 87   Temp 98.9 F (37.2 C)   Ht 5\' 11"  (1.803 m)   Wt 214 lb (97.1 kg)   SpO2 98%   BMI 29.85 kg/m  Wt Readings from Last 3 Encounters:  11/25/18 214 lb (97.1 kg)  10/24/18 217 lb (98.4 kg)  07/21/18 209 lb (94.8 kg)    There are no preventive care reminders to display for this patient.  There are no preventive care reminders to display for this patient.   Lab Results  Component Value Date   TSH 0.835 07/14/2008   Lab Results  Component Value Date   WBC 6.6 06/21/2018   HGB 16.6 06/21/2018   HCT 46.8 06/21/2018   MCV 84.6 06/21/2018   PLT 208 06/21/2018   Lab Results  Component Value Date   NA 137 03/20/2018   K 4.5 03/20/2018   CO2 30 03/20/2018   GLUCOSE 94 03/20/2018   BUN 17 03/20/2018   CREATININE 1.14 03/20/2018   BILITOT 0.4 03/20/2018   ALKPHOS 56 07/14/2008   AST 19 03/20/2018   ALT 19 03/20/2018   PROT 6.7 03/20/2018   ALBUMIN 4.9 07/14/2008   CALCIUM 9.6 03/20/2018   No results found for: CHOL Lab Results  Component Value Date   HDL 43 05/05/2016   No results found for: [facility] Lab Results  Component Value Date   TRIG 100 05/05/2016   Lab Results  Component Value Date   CHOLHDL 4.4 05/05/2016   Lab Results  Component Value Date   HGBA1C 5.4 06/21/2018       Assessment &  Plan:   Problem List Items Addressed This  Visit    None    Visit Diagnoses    Viral upper respiratory tract infection    -  Primary     Upper respiratory infection.  Currently no fever or cough so less likely to be COVID-19.  We discussed staying home for at least the next 24 hours just to make sure that he is not getting worse if he is doing okay then he can return to work on Wednesday.  If he does get worse then please give [name] call back.  In the meantime recommend second biotic care.  They well-hydrated.  No orders of the defined types were placed in this encounter.    Beatrice Lecher, MD

## 2018-11-27 ENCOUNTER — Ambulatory Visit: Payer: BC Managed Care – PPO | Admitting: Family Medicine

## 2018-12-16 ENCOUNTER — Other Ambulatory Visit (HOSPITAL_COMMUNITY): Payer: Self-pay | Admitting: Psychiatry

## 2019-01-15 ENCOUNTER — Encounter: Payer: Self-pay | Admitting: Family Medicine

## 2019-01-17 ENCOUNTER — Encounter: Payer: Self-pay | Admitting: Family Medicine

## 2019-02-04 ENCOUNTER — Ambulatory Visit: Payer: BC Managed Care – PPO | Admitting: Family Medicine

## 2019-02-04 ENCOUNTER — Encounter: Payer: Self-pay | Admitting: Family Medicine

## 2019-02-04 ENCOUNTER — Ambulatory Visit (INDEPENDENT_AMBULATORY_CARE_PROVIDER_SITE_OTHER): Payer: BC Managed Care – PPO | Admitting: Family Medicine

## 2019-02-04 VITALS — BP 140/96 | HR 91 | Ht 71.0 in

## 2019-02-04 DIAGNOSIS — R0789 Other chest pain: Secondary | ICD-10-CM | POA: Diagnosis not present

## 2019-02-04 DIAGNOSIS — R030 Elevated blood-pressure reading, without diagnosis of hypertension: Secondary | ICD-10-CM | POA: Diagnosis not present

## 2019-02-04 DIAGNOSIS — R635 Abnormal weight gain: Secondary | ICD-10-CM

## 2019-02-04 NOTE — Progress Notes (Signed)
Virtual Visit via Video Note  I connected with Ronald [name] on 02/04/19 at  4:00 PM EDT by a video enabled telemedicine application and verified that I am speaking with the correct person using two identifiers.   I discussed the limitations of evaluation and management by telemedicine and the availability of in person appointments. The patient expressed understanding and agreed to proceed.  Subjective:    CC:   HPI: Pt reports that since he had been sedentary he had done some push ups and he checked his BP about 5 -10 minutes and it was higher than usual 180/110 somewhere in this range. He felt his hear racing at that time and had some tightness in his chest that lasted maybe an hour. He tooik a nap later that day.  He said he did check it later that day and it came down but it still was not a normal pressure.  When he checked it a few days later it was around 130/89 he just wanted to make sure that he hadn't "injured"his heart. No recent chest pain Denies any SOB or chest pain but noticed a difference.   He had been very sedentary for the last 2 months and started exercising again. Had been going to the gym before that. He has tried to lift weights since then and did OK.   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  He was able to work without any chest pain or difficulty today.  He does admit that he is probably gained about 10 pounds since being home with COVID.  Past medical history, Surgical history, Family history not pertinant except as noted below, Social history, Allergies, and medications have been entered into the medical record, reviewed, and corrections made.   Review of Systems: No fevers, chills, night sweats, weight loss, chest pain, or shortness of breath.   Objective:    General: Speaking clearly in complete sentences without any shortness of breath.  Alert and oriented x3.  Normal judgment. No apparent  acute distress.  Well-groomed.    Impression and Recommendations:    Elevated blood pressure-suspect that it was probably a fairly normal physiologic response to exercise as he checked his blood pressure within about 10 minutes right after doing some push-ups.  Though he was feeling significant tachycardia at the time which is why actually checked his blood pressure.  It may have just been a physiologic response to pushing his exercise limits after being deconditioned for 2 months.  Though he is greater than 50 so consider possible heart disease.  I would like to schedule him for an EKG even though he has not had any recurrent symptoms.  We also discussed the possibility of getting a stress test but he wants to hold off on that for now.  Would also like him to keep an eye on his blood pressures daily until I see him for the EKG it may be that his baseline blood pressure has elevated especially with some recent weight gain.  Atypical chest pain-see note above for elevated blood pressure.  Weight gain -encouraged him to work on healthy diet and staying active to reduce his weight.  His blood pressure looks fantastic a year ago when his weight was closer to 208 pounds.        I discussed the assessment and treatment plan with the patient. The patient was provided an opportunity to ask  questions and all were answered. The patient agreed with the plan and demonstrated an understanding of the instructions.   The patient was advised to call back or seek an in-person evaluation if the symptoms worsen or if the condition fails to improve as anticipated.   Beatrice Lecher, MD

## 2019-02-04 NOTE — Progress Notes (Signed)
Pt was not able to locate his BP cuff at the time when I called. He stated that he will continue to look for it and check it if he finds it.   Pt reports that since he had been sedentary he had done some push ups and he checked his BP and it was higher than usual 180/110 somewhere in this range and when he checked it a few days later it was around 130/89 he just wanted to make sure that he hadn't "injured"his heart. Denies any SOB or chest pain but noticed a difference.Maryruth Eve, Lahoma Crocker, CMA

## 2019-02-10 ENCOUNTER — Ambulatory Visit (INDEPENDENT_AMBULATORY_CARE_PROVIDER_SITE_OTHER): Payer: BC Managed Care – PPO | Admitting: Family Medicine

## 2019-02-10 ENCOUNTER — Ambulatory Visit (HOSPITAL_COMMUNITY): Payer: BC Managed Care – PPO | Admitting: Psychiatry

## 2019-02-10 ENCOUNTER — Encounter: Payer: Self-pay | Admitting: Family Medicine

## 2019-02-10 VITALS — BP 123/71 | HR 91 | Ht 71.0 in | Wt 220.0 lb

## 2019-02-10 DIAGNOSIS — K2100 Gastro-esophageal reflux disease with esophagitis, without bleeding: Secondary | ICD-10-CM

## 2019-02-10 DIAGNOSIS — R000 Tachycardia, unspecified: Secondary | ICD-10-CM

## 2019-02-10 DIAGNOSIS — R0789 Other chest pain: Secondary | ICD-10-CM

## 2019-02-10 DIAGNOSIS — R030 Elevated blood-pressure reading, without diagnosis of hypertension: Secondary | ICD-10-CM

## 2019-02-10 MED ORDER — OMEPRAZOLE 40 MG PO CPDR: 40.0000 mg | DELAYED_RELEASE_CAPSULE | Freq: Every day | ORAL | 3 refills | Status: DC

## 2019-02-10 NOTE — Progress Notes (Signed)
Established Patient Office Visit  Subjective:  Patient ID: Ronald [name], male    DOB: 1966-05-18  Age: 53 y.o. MRN: [id number]  CC:  Chief Complaint  Patient presents with  . Chest Pain    HPI COLSEN MODI presents for atypical chest pain. He is feeilng much better. He hasn't had any more episodes of chest pain.  Says his BP was high a few days ago.  He does drink a lot of caffeine.  We did a virtual visit last week and I had asked him to come in today just to that we could do a cardiac exam as well as get an EKG.  No Prior history of heart disease though he is over 50.  He also reports he has been having a lot of heartburn symptoms where he feels like a burning sensation in his chest after he eats.  No nausea or vomiting with it.  He says that he will take Tums usually will take 2 few nights a week when it happens sometimes it literally wakes him up at night.  He is not currently on any type of PPI right now.  He also reports that at times his blood pressure is elevated.  He said about a year ago he was under a lot of stress because they were short handed at work and at times his pulse was hitting the 130s.  He says he was able to purposely work to reduce the stress levels and bring his pulse down but even then it was usually in the 90s or low 100s.  Past Medical History:  Diagnosis Date  . Cancer (HCC)    Skin  . Hyperlipidemia 01/20/2015  . Schizoaffective disorder ([place]) 07/14/2008   Qualifier: Diagnosis of  By: [name], Barnetta Chapel    . Squamous cell skin cancer, ala nasi     Past Surgical History:  Procedure Laterality Date  . SKIN BIOPSY      Family History  Problem Relation Age of Onset  . Diabetes Mellitus II Brother   . Schizophrenia Maternal Uncle   . Alcohol abuse Neg Hx   . Anxiety disorder Neg Hx   . Bipolar disorder Neg Hx   . Dementia Neg Hx   . Depression Neg Hx   . Drug abuse Neg Hx   . Coronary artery disease Neg Hx     Social History    Socioeconomic History  . Marital status: Single    Spouse name: Not on file  . Number of children: Not on file  . Years of education: Not on file  . Highest education level: Not on file  Occupational History  . Not on file  Social Needs  . Financial resource strain: Not on file  . Food insecurity:    Worry: Not on file    Inability: Not on file  . Transportation needs:    Medical: Not on file    Non-medical: Not on file  Tobacco Use  . Smoking status: Current Some Day Smoker    Packs/day: 1.00    Types: Cigarettes, Cigars    Last attempt to quit: 10/30/2008    Years since quitting: 10.2  . Smokeless tobacco: Never Used  . Tobacco comment: Smokes Cigars 1-2 several days a week.   Substance and Sexual Activity  . Alcohol use: Yes    Alcohol/week: 3.0 - 4.0 standard drinks    Types: 3 - 4 Cans of beer per week    Comment: non-alchoholic  .  Drug use: No  . Sexual activity: Not Currently  Lifestyle  . Physical activity:    Days per week: Not on file    Minutes per session: Not on file  . Stress: Not on file  Relationships  . Social connections:    Talks on phone: Not on file    Gets together: Not on file    Attends religious service: Not on file    Active member of club or organization: Not on file    Attends meetings of clubs or organizations: Not on file    Relationship status: Not on file  . Intimate partner violence:    Fear of current or ex partner: Not on file    Emotionally abused: Not on file    Physically abused: Not on file    Forced sexual activity: Not on file  Other Topics Concern  . Not on file  Social History Narrative  . Not on file    Outpatient Medications Prior to Visit  Medication Sig Dispense Refill  . ARIPiprazole (ABILIFY) 10 MG tablet Take 1 tablet (10 mg total) by mouth daily. 90 tablet 0  . famotidine (PEPCID) 40 MG tablet Take 1 tablet (40 mg total) by mouth daily. 90 tablet 1  . FLUoxetine (PROZAC) 20 MG capsule TAKE 3 CAPSULES BY  MOUTH EVERY DAY (Patient not taking: Reported on 02/04/2019) 270 capsule 0   No facility-administered medications prior to visit.     Allergies  Allergen Reactions  . Bee Venom Swelling  . Penicillins     Other reaction(s): Unknown Pt was told that he had a reaction when he was very young, he doesn't know if he has allergies or not    ROS Review of Systems    Objective:    Physical Exam  Constitutional: He is oriented to person, place, and time. He appears well-developed and well-nourished.  HENT:  Head: Normocephalic and atraumatic.  Cardiovascular: Normal rate, regular rhythm and normal heart sounds.  Pulmonary/Chest: Effort normal and breath sounds normal.  Neurological: He is alert and oriented to person, place, and time.  Skin: Skin is warm and dry.  Psychiatric: He has a normal mood and affect. His behavior is normal.    BP 123/71   Pulse 91   Ht 5\' 11"  (1.803 m)   Wt 220 lb (99.8 kg)   SpO2 99%   BMI 30.68 kg/m  Wt Readings from Last 3 Encounters:  02/10/19 220 lb (99.8 kg)  11/25/18 214 lb (97.1 kg)  10/24/18 217 lb (98.4 kg)     There are no preventive care reminders to display for this patient.  There are no preventive care reminders to display for this patient.  Lab Results  Component Value Date   TSH 0.835 07/14/2008   Lab Results  Component Value Date   WBC 6.6 06/21/2018   HGB 16.6 06/21/2018   HCT 46.8 06/21/2018   MCV 84.6 06/21/2018   PLT 208 06/21/2018   Lab Results  Component Value Date   NA 137 03/20/2018   K 4.5 03/20/2018   CO2 30 03/20/2018   GLUCOSE 94 03/20/2018   BUN 17 03/20/2018   CREATININE 1.14 03/20/2018   BILITOT 0.4 03/20/2018   ALKPHOS 56 07/14/2008   AST 19 03/20/2018   ALT 19 03/20/2018   PROT 6.7 03/20/2018   ALBUMIN 4.9 07/14/2008   CALCIUM 9.6 03/20/2018   No results found for: [facility] Lab Results  Component Value Date   HDL 43 05/05/2016  No results found for: [facility] Lab Results  Component Value  Date   TRIG 100 05/05/2016   Lab Results  Component Value Date   CHOLHDL 4.4 05/05/2016   Lab Results  Component Value Date   HGBA1C 5.4 06/21/2018      Assessment & Plan:   Problem List Items Addressed This Visit      Digestive   GERD (gastroesophageal reflux disease)   Relevant Medications   omeprazole (PRILOSEC) 40 MG capsule   Other Relevant Orders   COMPLETE METABOLIC PANEL WITH GFR   Lipid panel   TSH   CBC    Other Visit Diagnoses    Atypical chest pain    -  Primary   Relevant Orders   COMPLETE METABOLIC PANEL WITH GFR   Lipid panel   TSH   CBC   Elevated BP without diagnosis of hypertension       Relevant Orders   COMPLETE METABOLIC PANEL WITH GFR   Lipid panel   TSH   CBC   Tachycardia       Relevant Orders   COMPLETE METABOLIC PANEL WITH GFR   Lipid panel   TSH   CBC      Atypical chest pain-seems to have resolved but we did go ahead and do an EKG as we had planned.  Rate of 83 bpm, normal sinus rhythm with no acute ST-T wave changes.  Elevated blood pressure without diagnosis of hypertension-still getting a couple elevated pressures at home but today it looks absolutely phenomenal.  We discussed avoiding things that can elevate blood pressure such as salt intake caffeine etc.  Lack of sleep can also trigger elevated blood pressure levels we will keep an eye on this.  Also encouraged him to bring in his home blood pressure cuff to compare to ours to see if it is accurate  GERD- We discussed options including maybe starting a PPI for the next few weeks to see if this is helpful.  If not improving after 1 month and please let me know.  Tachycardia -it sounds like it was probably stress related.  His pulse actually looks great today it is 83 on his EKG.  But he is also not at work and more calm but did encourage him to keep an eye on this.  We could always consider beta-blocker if we need to reduce his pulse as well as his blood pressure levels.  It  sounds like stress reduction is really helpful for him.  Meds ordered this encounter  Medications  . omeprazole (PRILOSEC) 40 MG capsule    Sig: Take 1 capsule (40 mg total) by mouth daily.    Dispense:  30 capsule    Refill:  3    Follow-up: Return in about 3 months (around 05/13/2019), or if symptoms worsen or fail to improve, for recheck BP and bring in home cuff to check.  Beatrice Lecher, MD

## 2019-02-10 NOTE — Progress Notes (Signed)
He reports that he has not had any more episodes of CP since last visit.Ronald [name], [place]

## 2019-02-17 ENCOUNTER — Ambulatory Visit (INDEPENDENT_AMBULATORY_CARE_PROVIDER_SITE_OTHER): Payer: BC Managed Care – PPO | Admitting: Psychiatry

## 2019-02-17 ENCOUNTER — Encounter (HOSPITAL_COMMUNITY): Payer: Self-pay | Admitting: Psychiatry

## 2019-02-17 DIAGNOSIS — F259 Schizoaffective disorder, unspecified: Secondary | ICD-10-CM

## 2019-02-17 DIAGNOSIS — F258 Other schizoaffective disorders: Secondary | ICD-10-CM | POA: Diagnosis not present

## 2019-02-17 DIAGNOSIS — F411 Generalized anxiety disorder: Secondary | ICD-10-CM | POA: Diagnosis not present

## 2019-02-17 MED ORDER — ARIPIPRAZOLE 10 MG PO TABS: 10.0000 mg | ORAL_TABLET | Freq: Every day | ORAL | 0 refills | Status: DC

## 2019-02-17 NOTE — Progress Notes (Signed)
Patient ID: Ronald [name], male   DOB: 10-Jul-1966, 53 y.o.   MRN: [id number]   [place] Follow-up Outpatient Visit  Ronald [name] 10-20-1965 [id number] 53 y.o. 02/17/2019 3:19 PM I connected with [name] on 02/17/19 at  3:00 PM EDT by a video enabled telemedicine application and verified that I am speaking with the correct person using two identifiers.   I discussed the limitations of evaluation and management by telemedicine and the availability of in person appointments. The patient expressed understanding and agreed to proceed. Chief Complaint: follow up . Med review HPI Comments: Mr. Baldus is  a 53 y/o male with a past psychiatric history significant for Schizoaffective Disorder and Generalized anxiety disorder.   Brief history as per previous notes" Patient started having paranoid delusions in 2009"  saphris has helped in past.  Back to work after initial off work due to pandemic.  Doing fair on abilify. No tremors. Tolerating meds.had a vacation and better time prozac helps depression   Denies paranoia, he distract when this happens  Modifying factor: meds and family Past Medical Family, Social History:  Past Medical History:  Diagnosis Date  . Cancer (HCC)    Skin  . Hyperlipidemia 01/20/2015  . Schizoaffective disorder ([place]) 07/14/2008   Qualifier: Diagnosis of  By: [name], Barnetta Chapel    . Squamous cell skin cancer, ala nasi    Family History  Problem Relation Age of Onset  . Diabetes Mellitus II Brother   . Schizophrenia Maternal Uncle   . Alcohol abuse Neg Hx   . Anxiety disorder Neg Hx   . Bipolar disorder Neg Hx   . Dementia Neg Hx   . Depression Neg Hx   . Drug abuse Neg Hx   . Coronary artery disease Neg Hx    Social History   Socioeconomic History  . Marital status: Single    Spouse name: Not on file  . Number of children: Not on file  . Years of education: Not on file  . Highest education level: Not on file   Occupational History  . Not on file  Social Needs  . Financial resource strain: Not on file  . Food insecurity:    Worry: Not on file    Inability: Not on file  . Transportation needs:    Medical: Not on file    Non-medical: Not on file  Tobacco Use  . Smoking status: Current Some Day Smoker    Packs/day: 1.00    Types: Cigarettes, Cigars    Last attempt to quit: 10/30/2008    Years since quitting: 10.3  . Smokeless tobacco: Never Used  . Tobacco comment: Smokes Cigars 1-2 several days a week.   Substance and Sexual Activity  . Alcohol use: Yes    Alcohol/week: 3.0 - 4.0 standard drinks    Types: 3 - 4 Cans of beer per week    Comment: non-alchoholic  . Drug use: No  . Sexual activity: Not Currently  Lifestyle  . Physical activity:    Days per week: Not on file    Minutes per session: Not on file  . Stress: Not on file  Relationships  . Social connections:    Talks on phone: Not on file    Gets together: Not on file    Attends religious service: Not on file    Active member of club or organization: Not on file    Attends meetings of clubs or organizations: Not on file  Relationship status: Not on file  Other Topics Concern  . Not on file  Social History Narrative  . Not on file    Outpatient Encounter Medications as of 02/17/2019  Medication Sig  . ARIPiprazole (ABILIFY) 10 MG tablet Take 1 tablet (10 mg total) by mouth daily.  . famotidine (PEPCID) 40 MG tablet Take 1 tablet (40 mg total) by mouth daily.  Marland Kitchen FLUoxetine (PROZAC) 20 MG capsule TAKE 3 CAPSULES BY MOUTH EVERY DAY (Patient not taking: Reported on 02/04/2019)  . [DISCONTINUED] ARIPiprazole (ABILIFY) 10 MG tablet Take 1 tablet (10 mg total) by mouth daily.  . [DISCONTINUED] omeprazole (PRILOSEC) 40 MG capsule Take 1 capsule (40 mg total) by mouth daily.   No facility-administered encounter medications on file as of 02/17/2019.      Review of Systems  Cardiovascular: Negative for chest pain.  Skin:  Negative for rash.  Neurological: Negative for tingling.  Psychiatric/Behavioral: Negative for depression, hallucinations and suicidal ideas. The patient is not nervous/anxious.    There were no vitals filed for this visit.   Psychiatric Specialty Exam: General Appearance: Casual and Well Groomed  Eye Contact::  Fair  Speech:  Clear and Coherent and Normal Rate  Volume:  Normal  Mood: fair  Affect:  congruent  Thought Process:  Coherent, Linear and Logical  Orientation:  Full (Time, Place, and Person)  Thought Content:  WDL  Suicidal Thoughts:  No  Homicidal Thoughts:  No  Memory:  Immediate;   Good Recent;   Good Remote;   Good  Judgement:  Fair  Insight:  Fair  Psychomotor Activity:  Normal  Concentration:  Good  Recall:  Negative  Akathisia:  Negative  Language-Intact  Fund of Knowledge-Average  Handed:  Right  AIMS (if indicated):   As noted in chart.  Assets:  Communication Skills Desire for Improvement Financial Resources/Insurance Intimacy Leisure Time Physical Health Resilience Social Support Talents/Skills     Assessment: Schizoaffective Disorder  Axis I: Schizoaffective Disorder. GAD. Weight change  Plan:  1. Schizoaffective disorder:  Doing fair. abilify sent. No tremors 2. GAD: manageable. Continue prozac   reveiwed questions and renewed meds.fu 54m.   I discussed the assessment and treatment plan with the patient. The patient was provided an opportunity to ask questions and all were answered. The patient agreed with the plan and demonstrated an understanding of the instructions.   The patient was advised to call back or seek an in-person evaluation if the symptoms worsen or if the condition fails to improve as anticipated. Fu 87m.   02/17/2019 3:19 PM

## 2019-03-01 LAB — COMPLETE METABOLIC PANEL WITH GFR
AG Ratio: 2.3 (calc) (ref 1.0–2.5)
ALT: 22 U/L (ref 9–46)
AST: 22 U/L (ref 10–35)
Albumin: 4.4 g/dL (ref 3.6–5.1)
Alkaline phosphatase (APISO): 71 U/L (ref 35–144)
BUN: 12 mg/dL (ref 7–25)
CO2: 27 mmol/L (ref 20–32)
Calcium: 9.2 mg/dL (ref 8.6–10.3)
Chloride: 101 mmol/L (ref 98–110)
Creat: 1.13 mg/dL (ref 0.70–1.33)
GFR, Est African American: 86 mL/min/{1.73_m2} (ref >=60)
GFR, Est Non African American: 74 mL/min/{1.73_m2} (ref >=60)
Globulin: 1.9 g/dL (calc) (ref 1.9–3.7)
Glucose, Bld: 85 mg/dL (ref 65–99)
Potassium: 4.8 mmol/L (ref 3.5–5.3)
Sodium: 136 mmol/L (ref 135–146)
Total Bilirubin: 0.6 mg/dL (ref 0.2–1.2)
Total Protein: 6.3 g/dL (ref 6.1–8.1)

## 2019-03-01 LAB — LIPID PANEL
Cholesterol: 197 mg/dL (ref <200)
HDL: 40 mg/dL (ref >=40)
LDL Cholesterol (Calc): 128 mg/dL (calc) — ABNORMAL HIGH
Non-HDL Cholesterol (Calc): 157 mg/dL (calc) — ABNORMAL HIGH (ref <130)
Total CHOL/HDL Ratio: 4.9 (calc) (ref <5.0)
Triglycerides: 172 mg/dL — ABNORMAL HIGH (ref <150)

## 2019-03-01 LAB — CBC
HCT: 45.6 % (ref 38.5–50.0)
Hemoglobin: 15.7 g/dL (ref 13.2–17.1)
MCH: 29.7 pg (ref 27.0–33.0)
MCHC: 34.4 g/dL (ref 32.0–36.0)
MCV: 86.2 fL (ref 80.0–100.0)
MPV: 8.6 fL (ref 7.5–12.5)
Platelets: 204 10*3/uL (ref 140–400)
RBC: 5.29 10*6/uL (ref 4.20–5.80)
RDW: 13.1 % (ref 11.0–15.0)
WBC: 6.3 10*3/uL (ref 3.8–10.8)

## 2019-03-01 LAB — TSH: TSH: 1.17 mIU/L (ref 0.40–4.50)

## 2019-03-21 NOTE — Addendum Note (Signed)
Addended by: Clemetine Marker A on: 03/21/2019 08:52 AM   Modules accepted: Orders

## 2019-04-04 ENCOUNTER — Encounter: Payer: Self-pay | Admitting: Family Medicine

## 2019-04-04 DIAGNOSIS — K429 Umbilical hernia without obstruction or gangrene: Secondary | ICD-10-CM

## 2019-04-04 NOTE — Addendum Note (Signed)
Addended by: Towana Badger on: 04/04/2019 01:12 PM   Modules accepted: Orders

## 2019-04-04 NOTE — Telephone Encounter (Signed)
Referral pended. Please review and sign if ok

## 2019-04-04 NOTE — Addendum Note (Signed)
Addended by: Beatrice Lecher D on: 04/04/2019 04:36 PM   Modules accepted: Orders

## 2019-04-13 ENCOUNTER — Encounter: Payer: Self-pay | Admitting: Family Medicine

## 2019-04-15 ENCOUNTER — Telehealth (HOSPITAL_COMMUNITY): Payer: Self-pay | Admitting: Psychiatry

## 2019-04-15 NOTE — Telephone Encounter (Signed)
Patient would like Dr. De Nurse advise. He is aware Dr. Loni Muse is out of the office and will return next week.   He works at a [employer] as a Chartered certified accountant. He would like to know Dr. Evalina Field opinion if he thinks its it too stressful for him given the pandemic.   Please adivse.

## 2019-04-21 ENCOUNTER — Other Ambulatory Visit (HOSPITAL_COMMUNITY): Payer: Self-pay | Admitting: Psychiatry

## 2019-04-21 ENCOUNTER — Encounter: Payer: Self-pay | Admitting: Family Medicine

## 2019-04-21 NOTE — Telephone Encounter (Signed)
If he is able to manage, should continue work with safe distancing and precautions.

## 2019-04-21 NOTE — Telephone Encounter (Signed)
Crystal can send. Just call as well that he is stable and tolerating these medications

## 2019-04-21 NOTE — Telephone Encounter (Signed)
Informed patient of what Dr. De Nurse stated in his previous message. Patient stated his understanding.

## 2019-04-21 NOTE — Telephone Encounter (Signed)
Pt may be leaving his employer- he would like a 59m supply of his meds sent to [facility] on [address]

## 2019-04-21 NOTE — Telephone Encounter (Signed)
Please call pt to and have give him info for testing for COVID.  He can go tomorrow to drive up testing system. .  Then we can do a virtual for the rash on his eye and axillary swelling.

## 2019-04-22 ENCOUNTER — Other Ambulatory Visit: Payer: Self-pay

## 2019-04-22 ENCOUNTER — Encounter: Payer: Self-pay | Admitting: Physician Assistant

## 2019-04-22 ENCOUNTER — Ambulatory Visit (INDEPENDENT_AMBULATORY_CARE_PROVIDER_SITE_OTHER): Payer: BC Managed Care – PPO | Admitting: Physician Assistant

## 2019-04-22 VITALS — Temp 97.1°F

## 2019-04-22 DIAGNOSIS — Z20822 Contact with and (suspected) exposure to covid-19: Secondary | ICD-10-CM

## 2019-04-22 DIAGNOSIS — R05 Cough: Secondary | ICD-10-CM

## 2019-04-22 DIAGNOSIS — J019 Acute sinusitis, unspecified: Secondary | ICD-10-CM | POA: Diagnosis not present

## 2019-04-22 DIAGNOSIS — Z20828 Contact with and (suspected) exposure to other viral communicable diseases: Secondary | ICD-10-CM

## 2019-04-22 DIAGNOSIS — R058 Other specified cough: Secondary | ICD-10-CM

## 2019-04-22 MED ORDER — ARIPIPRAZOLE 10 MG PO TABS: 10.0000 mg | ORAL_TABLET | Freq: Every day | ORAL | 0 refills | Status: DC

## 2019-04-22 MED ORDER — FLUTICASONE PROPIONATE 50 MCG/ACT NA SUSP: 1.0000 | Freq: Every day | NASAL | 0 refills | Status: DC

## 2019-04-22 MED ORDER — FLUOXETINE HCL 20 MG PO CAPS: 60.0000 mg | ORAL_CAPSULE | Freq: Every day | ORAL | 0 refills | Status: DC

## 2019-04-22 MED ORDER — DOXYCYCLINE HYCLATE 100 MG PO TABS: 100.0000 mg | ORAL_TABLET | Freq: Two times a day (BID) | ORAL | 0 refills | Status: AC

## 2019-04-22 NOTE — Telephone Encounter (Signed)
Sent to pharmacy per Dr. De Nurse

## 2019-04-22 NOTE — Telephone Encounter (Signed)
Patient advised of recommendations. He has been scheduled for virtual visit.

## 2019-04-22 NOTE — Progress Notes (Signed)
Virtual Visit via Video Note  I connected with Ronald [name] on 04/22/19 at 10:30 AM EDT by a video enabled telemedicine application and verified that I am speaking with the correct person using two identifiers.   I discussed the limitations of evaluation and management by telemedicine and the availability of in person appointments. The patient expressed understanding and agreed to proceed.  History of Present Illness: HPI:                                                                Ronald [name] is a 53 y.o. male   Onset 2 weeks ago Reports new onset frontal headaches 2 weeks ago followed by development of sore throat and nasal congestion worse on the right side 1 week ago. Symptoms are worse first thing in the morning and improve throughout the day. Headache is frontal, comes and goes. He has a rare, occasional cough. He reports he has GERD and has to cough when he has regurgitation. He vomited once a few nights ago and he states this is a recurrent problem where he has a tendency to wake up nauseous when he sleeps on his right side.  Endorses fatigue, taking several naps per day, but this is also ongoing prior to symptom onset. Denies fever, chills, myalgia, shortness of breath, chest pain. Has not tried any treatment for symptoms.  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  Past Medical History:  Diagnosis Date  . Cancer (HCC)    Skin  . Hyperlipidemia 01/20/2015  . Schizoaffective disorder ([place]) 07/14/2008   Qualifier: Diagnosis of  By: [name], Barnetta Chapel    . Squamous cell skin cancer, ala nasi    Past Surgical History:  Procedure Laterality Date  . SKIN BIOPSY     Social History   Tobacco Use  . Smoking status: Current Some Day Smoker    Packs/day: 1.00    Types: Cigarettes, Cigars    Last attempt to quit: 10/30/2008     Years since quitting: 10.4  . Smokeless tobacco: Never Used  . Tobacco comment: Smokes Cigars 1-2 several days a week.   Substance Use Topics  . Alcohol use: Yes    Alcohol/week: 3.0 - 4.0 standard drinks    Types: 3 - 4 Cans of beer per week    Comment: non-alchoholic   family history includes Diabetes Mellitus II in his brother; Schizophrenia in his maternal uncle.    ROS: negative except as noted in the HPI  Medications: Current Outpatient Medications  Medication Sig Dispense Refill  . ARIPiprazole (ABILIFY) 10 MG tablet Take 1 tablet (10 mg total) by mouth daily. 90 tablet 0  . FLUoxetine (PROZAC) 20 MG capsule Take 3 capsules (60 mg total) by mouth daily. 270 capsule 0  . doxycycline (VIBRA-TABS) 100 MG tablet Take 1 tablet (100 mg total) by mouth 2 (two) times daily with a meal for 7 days. 14 tablet 0  . fluticasone (FLONASE) 50 MCG/ACT nasal spray Place 1 spray into both nostrils daily. 9.9 mL 0   No current facility-administered medications for this visit.    Allergies  Allergen Reactions  .  Bee Venom Swelling  . Penicillins     Other reaction(s): Unknown Pt was told that he had a reaction when he was very young, he doesn't know if he has allergies or not       Objective:  Temp (!) 97.1 F (36.2 C) (Oral)  Gen:  alert, not ill-appearing, no distress, appropriate for age 81: head normocephalic without obvious abnormality, conjunctiva and cornea clear, wearing glasses, trachea midline Pulm: Normal work of breathing, normal phonation Neuro: alert and oriented x 3 Psych: cooperative, euthymic mood, affect mood-congruent, speech is articulate, normal rate and volume; thought processes clear and goal-directed, normal judgment, good insight    No results found for this or any previous visit (from the past 72 hour(s)). No results found.    Assessment and Plan: 53 y.o. male with   .Ronald [name] was seen today for cough, nasal congestion, sore throat and  headache.  Diagnoses and all orders for this visit:  Acute non-recurrent sinusitis, unspecified location -     doxycycline (VIBRA-TABS) 100 MG tablet; Take 1 tablet (100 mg total) by mouth 2 (two) times daily with a meal for 7 days. -     fluticasone (FLONASE) 50 MCG/ACT nasal spray; Place 1 spray into both nostrils daily.  Nonproductive cough -     Novel Coronavirus, NAA ([facility])  Contact with and (suspected) exposure to other viral communicable diseases -     Novel Coronavirus, NAA ([facility])     Patient is afebrile. Unable to provide BP/HR On limited physical exam, he is not ill appearing and does not appear in respiratory distress History is suspicious for sinusitis - allergic versus viral versus bacterial Given persistent symptoms for 2 weeks, will treat empirically for bacterial sinusitis  Start antihistamine and intranasal steroid for possible allergic rhinitis and symptom management Due to potential exposure at work, will also test him for COVID-19. Patient was advised to quarantine while awaiting his results. Work note provided   Follow Up Instructions:    I discussed the assessment and treatment plan with the patient. The patient was provided an opportunity to ask questions and all were answered. The patient agreed with the plan and demonstrated an understanding of the instructions.   The patient was advised to call back or seek an in-person evaluation if the symptoms worsen or if the condition fails to improve as anticipated.  I provided 15 minutes of non-face-to-face time during this encounter.   Trixie Dredge, [place]

## 2019-04-22 NOTE — Telephone Encounter (Signed)
I informed patient of medications sent. I also informed him to contact him PCP because he stated he wanted Dr. Evalina Field opinion about being safe at his job with COVID-19. Patient states that he is working in an unsafe environment and does not feel comfortable do to the virus. Dr. De Nurse already addressed this issue and patient continues to want advice, but is not stating what exactly he is needing.

## 2019-04-23 ENCOUNTER — Encounter: Payer: Self-pay | Admitting: Physician Assistant

## 2019-04-23 ENCOUNTER — Encounter: Payer: Self-pay | Admitting: Family Medicine

## 2019-04-23 DIAGNOSIS — Z125 Encounter for screening for malignant neoplasm of prostate: Secondary | ICD-10-CM

## 2019-04-23 NOTE — Telephone Encounter (Signed)
Dr. Madilyn Fireman, are there any other labs you wanted ordered  before I message him back. WB

## 2019-04-24 ENCOUNTER — Encounter: Payer: Self-pay | Admitting: Physician Assistant

## 2019-04-24 LAB — NOVEL CORONAVIRUS, NAA: SARS-CoV-2, NAA: NOT DETECTED

## 2019-04-25 ENCOUNTER — Encounter: Payer: Self-pay | Admitting: Family Medicine

## 2019-04-25 NOTE — Telephone Encounter (Signed)
Discussed with provider, RX sent

## 2019-05-06 ENCOUNTER — Encounter: Payer: Self-pay | Admitting: Family Medicine

## 2019-05-06 NOTE — Telephone Encounter (Signed)
Pt advised and appt scheduled for Thursday.Maryruth Eve, [place] Crocker, CMA

## 2019-05-07 ENCOUNTER — Encounter: Payer: Self-pay | Admitting: Family Medicine

## 2019-05-08 ENCOUNTER — Other Ambulatory Visit: Payer: Self-pay | Admitting: Family Medicine

## 2019-05-08 ENCOUNTER — Ambulatory Visit (INDEPENDENT_AMBULATORY_CARE_PROVIDER_SITE_OTHER): Payer: BC Managed Care – PPO | Admitting: Family Medicine

## 2019-05-08 DIAGNOSIS — Z20828 Contact with and (suspected) exposure to other viral communicable diseases: Secondary | ICD-10-CM

## 2019-05-08 NOTE — Progress Notes (Signed)
   Subjective:    Patient ID: Ronald [name], male    DOB: 1966-04-19, 53 y.o.   MRN: [id number]  HPI Patient drove up outside to do a self COVID swab. Explained to patient how to correctly swab the nares and watched patient swab correctly. Tolerated well. No symptoms at this time.  Review of Systems     Objective:   Physical Exam        Assessment & Plan:

## 2019-05-09 ENCOUNTER — Other Ambulatory Visit: Payer: Self-pay | Admitting: Family Medicine

## 2019-05-09 NOTE — Progress Notes (Signed)
Agree with documentation as above.   Catherine Metheney, MD  

## 2019-05-13 ENCOUNTER — Encounter: Payer: Self-pay | Admitting: Family Medicine

## 2019-05-13 ENCOUNTER — Ambulatory Visit (INDEPENDENT_AMBULATORY_CARE_PROVIDER_SITE_OTHER): Payer: BC Managed Care – PPO | Admitting: Family Medicine

## 2019-05-13 ENCOUNTER — Other Ambulatory Visit: Payer: Self-pay

## 2019-05-13 VITALS — BP 127/75 | HR 103 | Temp 98.7°F | Ht 71.0 in | Wt 219.0 lb

## 2019-05-13 DIAGNOSIS — R030 Elevated blood-pressure reading, without diagnosis of hypertension: Secondary | ICD-10-CM | POA: Diagnosis not present

## 2019-05-13 DIAGNOSIS — L821 Other seborrheic keratosis: Secondary | ICD-10-CM

## 2019-05-13 DIAGNOSIS — H348112 Central retinal vein occlusion, right eye, stable: Secondary | ICD-10-CM | POA: Diagnosis not present

## 2019-05-13 DIAGNOSIS — E785 Hyperlipidemia, unspecified: Secondary | ICD-10-CM | POA: Diagnosis not present

## 2019-05-13 NOTE — Assessment & Plan Note (Signed)
LDL mild elevation at 128.  Discussed importance of healthy diet and regular exercise to keep this under control and to reduce risk.  Not high enough that we would typically start a statin.

## 2019-05-13 NOTE — Progress Notes (Addendum)
Established Patient Office Visit  Subjective:  Patient ID: Ronald [name], male    DOB: June 20, 1966  Age: 53 y.o. MRN: [id number]  CC:  Chief Complaint  Patient presents with  . Hypertension  . Eye Pain    R eye he [facility] and he has a blood spot R corner of eye he has been using compresses and refresh eye drops.   . Nevus    R calf he has noticed that this has changed color and would like for Dr. Madilyn Fireman to check this    HPI Ronald [name] presents for follow-up.    Feels like his sinus infection has cleared up. He was seen about 2 and half weeks ago and diagnosed with sinusitis.  He also c/o right eye issue- R eye he was [facility] and he has a blood spot R corner of eye he has been using compresses and refresh eye drops.  He has been treated for a retinal venous occlusion.  This is the second 1 that he has had this year.  The first 1 was sometime around December.  Also has a nevus on his right calf.  It has changed color and it has has gotten larger.   He is also here to follow-up for blood pressure.  At previous office visit blood pressure was elevated.  It seems to be back down today and he is feeling well overall.  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    Past Medical History:  Diagnosis Date  . Cancer (HCC)    Skin  . Hyperlipidemia 01/20/2015  . Schizoaffective disorder ([place]) 07/14/2008   Qualifier: Diagnosis of  By: [name], Barnetta Chapel    . Squamous cell skin cancer, ala nasi     Past Surgical History:  Procedure Laterality Date  . SKIN BIOPSY      Family History  Problem Relation Age of Onset  . Diabetes Mellitus II Brother   . Schizophrenia Maternal Uncle   . Alcohol abuse Neg Hx   . Anxiety disorder Neg Hx   .  Bipolar disorder Neg Hx   . Dementia Neg Hx   . Depression Neg Hx   . Drug abuse Neg Hx   . Coronary artery disease Neg Hx     Social History   Socioeconomic History  . Marital status: Single    Spouse name: Not on file  . Number of children: Not on file  . Years of education: Not on file  . Highest education level: Not on file  Occupational History  . Not on file  Social Needs  . Financial resource strain: Not on file  . Food insecurity    Worry: Not on file    Inability: Not on file  . Transportation needs    Medical: Not on file    Non-medical: Not on file  Tobacco Use  . Smoking status: Current Some Day Smoker    Packs/day: 1.00    Types: Cigarettes, Cigars    Last attempt to quit: 10/30/2008    Years since quitting: 10.5  . Smokeless tobacco: Never Used  . Tobacco comment: Smokes Cigars 1-2 several days a week.   Substance  and Sexual Activity  . Alcohol use: Yes    Alcohol/week: 3.0 - 4.0 standard drinks    Types: 3 - 4 Cans of beer per week    Comment: non-alchoholic  . Drug use: No  . Sexual activity: Not Currently  Lifestyle  . Physical activity    Days per week: Not on file    Minutes per session: Not on file  . Stress: Not on file  Relationships  . Social Herbalist on phone: Not on file    Gets together: Not on file    Attends religious service: Not on file    Active member of club or organization: Not on file    Attends meetings of clubs or organizations: Not on file    Relationship status: Not on file  . Intimate partner violence    Fear of current or ex partner: Not on file    Emotionally abused: Not on file    Physically abused: Not on file    Forced sexual activity: Not on file  Other Topics Concern  . Not on file  Social History Narrative  . Not on file    Outpatient Medications Prior to Visit  Medication Sig Dispense Refill  . ARIPiprazole (ABILIFY) 10 MG tablet Take 1 tablet (10 mg total) by mouth daily. 90 tablet 0  .  FLUoxetine (PROZAC) 20 MG capsule Take 3 capsules (60 mg total) by mouth daily. 270 capsule 0  . fluticasone (FLONASE) 50 MCG/ACT nasal spray Place 1 spray into both nostrils daily. 9.9 mL 0  . omeprazole (PRILOSEC) 40 MG capsule TAKE 1 CAPSULE BY MOUTH EVERY DAY 90 capsule 1   No facility-administered medications prior to visit.     Allergies  Allergen Reactions  . Bee Venom Swelling  . Penicillins     Other reaction(s): Unknown Pt was told that he had a reaction when he was very young, he doesn't know if he has allergies or not    ROS Review of Systems    Objective:    Physical Exam  Constitutional: He is oriented to person, place, and time. He appears well-developed and well-nourished.  HENT:  Head: Normocephalic and atraumatic.  Cardiovascular: Normal rate, regular rhythm and normal heart sounds.  Pulmonary/Chest: Effort normal and breath sounds normal.  Musculoskeletal:     Comments: On the posterior right calf he has a oval-shaped grayish-brown seborrheic keratosis.  Neurological: He is alert and oriented to person, place, and time.  Skin: Skin is warm and dry.  Psychiatric: He has a normal mood and affect. His behavior is normal.    BP 127/75   Pulse (!) 103   Temp 98.7 F (37.1 C)   Ht 5\' 11"  (1.803 m)   Wt 219 lb (99.3 kg)   SpO2 98%   BMI 30.54 kg/m  Wt Readings from Last 3 Encounters:  05/13/19 219 lb (99.3 kg)  02/10/19 220 lb (99.8 kg)  11/25/18 214 lb (97.1 kg)     Health Maintenance Due  Topic Date Due  . COLONOSCOPY  10/31/2015    There are no preventive care reminders to display for this patient.  Lab Results  Component Value Date   TSH 1.17 02/28/2019   Lab Results  Component Value Date   WBC 6.3 02/28/2019   HGB 15.7 02/28/2019   HCT 45.6 02/28/2019   MCV 86.2 02/28/2019   PLT 204 02/28/2019   Lab Results  Component Value Date   NA 136 02/28/2019  K 4.8 02/28/2019   CO2 27 02/28/2019   GLUCOSE 85 02/28/2019   BUN 12  02/28/2019   CREATININE 1.13 02/28/2019   BILITOT 0.6 02/28/2019   ALKPHOS 56 07/14/2008   AST 22 02/28/2019   ALT 22 02/28/2019   PROT 6.3 02/28/2019   ALBUMIN 4.9 07/14/2008   CALCIUM 9.2 02/28/2019   Lab Results  Component Value Date   CHOL 197 02/28/2019   Lab Results  Component Value Date   HDL 40 02/28/2019   Lab Results  Component Value Date   LDLCALC 128 (H) 02/28/2019   Lab Results  Component Value Date   TRIG 172 (H) 02/28/2019   Lab Results  Component Value Date   CHOLHDL 4.9 02/28/2019   Lab Results  Component Value Date   HGBA1C 5.4 06/21/2018      Assessment & Plan:   Problem List Items Addressed This Visit      Other   Hyperlipidemia    LDL mild elevation at 128.  Discussed importance of healthy diet and regular exercise to keep this under control and to reduce risk.  Not high enough that we would typically start a statin.       Other Visit Diagnoses    Seborrheic keratosis    -  Primary   Retinal vein occlusion of right eye, unspecified retinal vein       Elevated BP without diagnosis of hypertension         Seborrheic keratosis-discussed that this is a benign hereditary lesion.  Can treat with cryotherapy if patient prefers definitive treatment.  Discussed retinal vein occlusion-discussed that he really should speak with his eye doctor about the potential cause and whether or not there may be something that he can do to help prevent further occlusions.  He does have a mild LDL elevation but no known atherosclerosis.  Blood pressure is actually well controlled today.  Elevated BP-blood pressure looks great today.  No orders of the defined types were placed in this encounter.   Follow-up: Return if symptoms worsen or fail to improve.    Beatrice Lecher, MD

## 2019-05-13 NOTE — Patient Instructions (Signed)
Central Retinal Vein Occlusion, Adult  Central retinal vein occlusion (CRVO) is a blockage in the main blood vessel that carries blood away from the retina (central retinal vein). The retina is the part of your eye that senses light and sends signals to the brain that allow you to see. CRVO can cause blurry vision. It can also cause complete or partial vision loss. The condition usually affects only one eye. What are the causes? This condition is usually caused by a blood clot that forms inside the central retinal vein. A clot is blood that has thickened into a gel or solid. Vision loss happens because the blockage in the vein causes fluid to leak out of the vein and collect in the retina. Sometimes the cause is not known. What increases the risk? This condition is more likely to develop in:  People who have a blood vessel disease that causes narrowing of the arteries (atherosclerosis). This is the main risk factor for this condition.  People who are age 33 or older.  People who have any of the following medical conditions: ? High blood pressure (hypertension). ? Diabetes. ? Heart disease. ? High levels of fat in the blood (hyperlipidemia). ? Increased pressure inside the eye (glaucoma). ? Disorders that increase blood clotting (coagulopathy). What are the signs or symptoms? Symptoms of this condition include:  Sudden and painless vision loss. If the vision loss is partial, it may get worse over a span of hours or days.  Sudden and painless blurry vision.  Tiny spots or clumps that move across your vision (floaters).  Pain or pressure in your eye (uncommon). How is this diagnosed? This condition is usually diagnosed by a health care provider who specializes in eye diseases (ophthalmologist). To diagnose the condition, the ophthalmologist will do an eye exam. She or he may also:  Do an exam that involves having eye drops put in the eye (slit lamp exam or dilated fundus exam).  Order  tests that help diagnose the condition, such as: ? A vision test. ? A measurement of the pressure inside your eye. ? A fluorescein angiogram. In this test, pictures are taken of your retina after a dye is injected into your bloodstream. ? Optical coherence tomography. In this test, light waves are used to create pictures of your retina.  Check your blood pressure.  Order tests to find the cause of your condition and rule out other conditions. The tests may check for these problems: ? Diabetes. ? High levels of fat or cholesterol in your blood. ? Abnormal blood clotting. How is this treated? There is no cure for this condition. The goals of treatment are to save as much of your vision as possible and to prevent the condition from happening again. Treatment may include:  Medicine to reduce swelling in your eye. The medicine may be injected in your eye, or an implant may be placed in your eye to release the medicine.  Laser treatment to reduce swelling and stop fluid leakage in the eye (focal laser treatment).  Laser treatment to seal or destroy abnormal blood vessels (pan-retinal laser treatment). Follow these instructions at home:  Use over-the-counter and prescription medicines only as told by your health care provider.  Keep all follow-up visits as told by your health care provider. This is important. How is this prevented? To help prevent this condition:  Work with your health care providers to reduce your risk factors.  Do not use any products that contain nicotine or tobacco, such  as cigarettes and e-cigarettes. If you need help quitting, ask your health care provider.  Follow a heart-healthy diet. This diet includes a lot of fruits, vegetables, grains, and lean protein. It is low in saturated fat and sugar.  Maintain a healthy weight.  Exercise regularly. Contact a health care provider if:  You have any changes in your vision. Get help right away if:  You have eye  pain.  You have symptoms of this condition, including blurry vision or floaters.  You have new or sudden vision loss. Summary  Central retinal vein occlusion (CRVO) is a blockage in the main blood vessel that carries blood away from the retina (central retinal vein). The retina is the part of your eye that senses light and sends signals to the brain that allow you to see.  CRVO can cause blurry vision. It can also cause complete or partial vision loss. The condition usually affects only one eye.  There is no cure for this condition. The goals of treatment are to save as much of your vision as possible and to prevent the condition from happening again.  Get help right away if you have new or sudden vision loss. This information is not intended to replace advice given to you by your health care provider. Make sure you discuss any questions you have with your health care provider. Document Released: 12/10/2000 Document Revised: 08/10/2017 Document Reviewed: 11/03/2016 Elsevier Patient Education  2020 [place].  Dyslipidemia Dyslipidemia is an imbalance of waxy, fat-like substances (lipids) in the blood. The body needs lipids in small amounts. Dyslipidemia often involves a high level of cholesterol or triglycerides, which are types of lipids. Common forms of dyslipidemia include:  High levels of LDL cholesterol. LDL is the type of cholesterol that causes fatty deposits (plaques) to build up in the blood vessels that carry blood away from your heart (arteries).  Low levels of HDL cholesterol. HDL cholesterol is the type of cholesterol that protects against heart disease. High levels of HDL remove the LDL buildup from arteries.  High levels of triglycerides. Triglycerides are a fatty substance in the blood that is linked to a buildup of plaques in the arteries. What are the causes? Primary dyslipidemia is caused by changes (mutations) in genes that are passed down through families  (inherited). These mutations cause several types of dyslipidemia. Secondary dyslipidemia is caused by lifestyle choices and diseases that lead to dyslipidemia, such as:  Eating a diet that is high in animal fat.  Not getting enough exercise.  Having diabetes, kidney disease, liver disease, or thyroid disease.  Drinking large amounts of alcohol.  Using certain medicines. What increases the risk? You are more likely to develop this condition if you are an older man or if you are a woman who has gone through menopause. Other risk factors include:  Having a family history of dyslipidemia.  Taking certain medicines, including birth control pills, steroids, some diuretics, and beta-blockers.  Smoking cigarettes.  Eating a high-fat diet.  Having certain medical conditions such as diabetes, polycystic ovary syndrome (PCOS), kidney disease, liver disease, or hypothyroidism.  Not exercising regularly.  Being overweight or obese with too much belly fat. What are the signs or symptoms? In most cases, dyslipidemia does not usually cause any symptoms. In severe cases, very high lipid levels can cause:  Fatty bumps under the skin (xanthomas).  White or gray ring around the black center (pupil) of the eye. Very high triglyceride levels can cause inflammation of the pancreas (  pancreatitis). How is this diagnosed? Your health care provider may diagnose dyslipidemia based on a routine blood test (fasting blood test). Because most people do not have symptoms of the condition, this blood testing (lipid profile) is done on adults age 54 and older and is repeated every 5 years. This test checks:  Total cholesterol. This measures the total amount of cholesterol in your blood, including LDL cholesterol, HDL cholesterol, and triglycerides. A healthy number is below 200.  LDL cholesterol. The target number for LDL cholesterol is different for each person, depending on individual risk factors. Ask your  health care provider what your LDL cholesterol should be.  HDL cholesterol. An HDL level of 60 or higher is best because it helps to protect against heart disease. A number below 72 for men or below 20 for women increases the risk for heart disease.  Triglycerides. A healthy triglyceride number is below 150. If your lipid profile is abnormal, your health care provider may do other blood tests. How is this treated? Treatment depends on the type of dyslipidemia that you have and your other risk factors for heart disease and stroke. Your health care provider will have a target range for your lipid levels based on this information. For many people, this condition may be treated by lifestyle changes, such as diet and exercise. Your health care provider may recommend that you:  Get regular exercise.  Make changes to your diet.  Quit smoking if you smoke. If diet changes and exercise do not help you reach your goals, your health care provider may also prescribe medicine to lower lipids. The most commonly prescribed type of medicine lowers your LDL cholesterol (statin drug). If you have a high triglyceride level, your provider may prescribe another type of drug (fibrate) or an omega-3 fish oil supplement, or both. Follow these instructions at home:  Eating and drinking  Follow instructions from your health care provider or dietitian about eating or drinking restrictions.  Eat a healthy diet as told by your health care provider. This can help you reach and maintain a healthy weight, lower your LDL cholesterol, and raise your HDL cholesterol. This may include: ? Limiting your calories, if you are overweight. ? Eating more fruits, vegetables, whole grains, fish, and lean meats. ? Limiting saturated fat, trans fat, and cholesterol.  If you drink alcohol: ? Limit how much you use. ? Be aware of how much alcohol is in your drink. In the [place] one drink equals one 12 oz bottle of beer (355 mL), one 5  oz glass of wine (148 mL), or one 1 oz glass of hard liquor (44 mL).  Do not drink alcohol if: ? Your health care provider tells you not to drink. ? You are pregnant, may be pregnant, or are planning to become pregnant. Activity  Get regular exercise. Start an exercise and strength training program as told by your health care provider. Ask your health care provider what activities are safe for you. Your health care provider may recommend: ? 30 minutes of aerobic activity 4-6 days a week. Brisk walking is an example of aerobic activity. ? Strength training 2 days a week. General instructions  Do not use any products that contain nicotine or tobacco, such as cigarettes, e-cigarettes, and chewing tobacco. If you need help quitting, ask your health care provider.  Take over-the-counter and prescription medicines only as told by your health care provider. This includes supplements.  Keep all follow-up visits as told by your health care  provider. Contact a health care provider if:  You are: ? Having trouble sticking to your exercise or diet plan. ? Struggling to quit smoking or control your use of alcohol. Summary  Dyslipidemia often involves a high level of cholesterol or triglycerides, which are types of lipids.  Treatment depends on the type of dyslipidemia that you have and your other risk factors for heart disease and stroke.  For many people, treatment starts with lifestyle changes, such as diet and exercise.  Your health care provider may prescribe medicine to lower lipids. This information is not intended to replace advice given to you by your health care provider. Make sure you discuss any questions you have with your health care provider. Document Released: 09/02/2013 Document Revised: 04/22/2018 Document Reviewed: 03/29/2018 Elsevier Patient Education  [place].

## 2019-05-14 ENCOUNTER — Telehealth: Payer: Self-pay

## 2019-05-14 ENCOUNTER — Encounter: Payer: Self-pay | Admitting: Family Medicine

## 2019-05-14 LAB — NOVEL CORONAVIRUS, NAA

## 2019-05-14 NOTE — Telephone Encounter (Signed)
Received fax from [facility] stating that test was cancelled due to patient name not matching requisition.   I have called [facility] to try to correct the issue. I spoke with Roselyn Reef at [phone] and she advised me that Trudi Ida from [facility] branch will be reaching out about sample.

## 2019-05-15 ENCOUNTER — Other Ambulatory Visit: Payer: Self-pay | Admitting: Physician Assistant

## 2019-05-15 DIAGNOSIS — J019 Acute sinusitis, unspecified: Secondary | ICD-10-CM

## 2019-05-16 NOTE — Telephone Encounter (Signed)
Have not heard back from [facility]- placed follow up call and spoke with Iman. She states that they need consent form signed that sample belongs to patient. I have requested this be faxed to [name], advised them this is important to be run.

## 2019-05-20 LAB — SPECIMEN STATUS REPORT

## 2019-05-20 LAB — NOVEL CORONAVIRUS, NAA: SARS-CoV-2, NAA: NOT DETECTED

## 2019-05-21 NOTE — Telephone Encounter (Signed)
We were able to receive results- pt has been advised

## 2019-06-19 ENCOUNTER — Encounter (HOSPITAL_COMMUNITY): Payer: Self-pay | Admitting: Psychiatry

## 2019-06-19 ENCOUNTER — Ambulatory Visit (INDEPENDENT_AMBULATORY_CARE_PROVIDER_SITE_OTHER): Payer: BC Managed Care – PPO | Admitting: Psychiatry

## 2019-06-19 DIAGNOSIS — F259 Schizoaffective disorder, unspecified: Secondary | ICD-10-CM

## 2019-06-19 DIAGNOSIS — F411 Generalized anxiety disorder: Secondary | ICD-10-CM

## 2019-06-19 NOTE — Progress Notes (Signed)
Patient ID: Ronald [name], male   DOB: Feb 08, 1966, 53 y.o.   MRN: [id number]   [place] Follow-up Outpatient Visit  Ronald [name] May 30, 1966 [id number] 53 y.o. 06/19/2019 3:13 PM  I connected with Ronald [name] on 06/19/19 at  3:00 PM EDT by a video enabled telemedicine application and verified that I am speaking with the correct person using two identifiers.      I discussed the limitations of evaluation and management by telemedicine and the availability of in person appointments. The patient expressed understanding and agreed to proceed. Chief Complaint: follow up . Med review  HPI Comments: Ronald [name] is  a 53 y/o male with a past psychiatric history significant for Schizoaffective Disorder and Generalized anxiety disorder.   Brief history as per previous notes" Patient started having paranoid delusions in 2009"  saphris has helped in past.  Had retinal vein occlusion couldn't work or function, managing it and now had to let job go  Mood wise fair, not paranoid May call in December to change back to saphris generc when released   No tremors   Denies paranoia, he distract when this happens  Modifying factor: meds and family Past Medical Family, Social History:  Past Medical History:  Diagnosis Date  . Cancer (HCC)    Skin  . Hyperlipidemia 01/20/2015  . Schizoaffective disorder ([place]) 07/14/2008   Qualifier: Diagnosis of  By: [name], Barnetta Chapel    . Squamous cell skin cancer, ala nasi    Family History  Problem Relation Age of Onset  . Diabetes Mellitus II Brother   . Schizophrenia Maternal Uncle   . Alcohol abuse Neg Hx   . Anxiety disorder Neg Hx   . Bipolar disorder Neg Hx   . Dementia Neg Hx   . Depression Neg Hx   . Drug abuse Neg Hx   . Coronary artery disease Neg Hx    Social History   Socioeconomic History  . Marital status: Single    Spouse name: Not on file  . Number of children: Not on file  . Years of education: Not on  file  . Highest education level: Not on file  Occupational History  . Not on file  Social Needs  . Financial resource strain: Not on file  . Food insecurity    Worry: Not on file    Inability: Not on file  . Transportation needs    Medical: Not on file    Non-medical: Not on file  Tobacco Use  . Smoking status: Current Some Day Smoker    Packs/day: 1.00    Types: Cigarettes, Cigars    Last attempt to quit: 10/30/2008    Years since quitting: 10.6  . Smokeless tobacco: Never Used  . Tobacco comment: Smokes Cigars 1-2 several days a week.   Substance and Sexual Activity  . Alcohol use: Yes    Alcohol/week: 3.0 - 4.0 standard drinks    Types: 3 - 4 Cans of beer per week    Comment: non-alchoholic  . Drug use: No  . Sexual activity: Not Currently  Lifestyle  . Physical activity    Days per week: Not on file    Minutes per session: Not on file  . Stress: Not on file  Relationships  . Social Herbalist on phone: Not on file    Gets together: Not on file    Attends religious service: Not on file    Active member of club or  organization: Not on file    Attends meetings of clubs or organizations: Not on file    Relationship status: Not on file  Other Topics Concern  . Not on file  Social History Narrative  . Not on file    Outpatient Encounter Medications as of 06/19/2019  Medication Sig  . ARIPiprazole (ABILIFY) 10 MG tablet Take 1 tablet (10 mg total) by mouth daily.  Marland Kitchen FLUoxetine (PROZAC) 20 MG capsule Take 3 capsules (60 mg total) by mouth daily.   No facility-administered encounter medications on file as of 06/19/2019.      Review of Systems  Cardiovascular: Negative for chest pain.  Skin: Negative for rash.  Psychiatric/Behavioral: Negative for depression, hallucinations and suicidal ideas. The patient is not nervous/anxious.    There were no vitals filed for this visit.   Psychiatric Specialty Exam: General Appearance: Casual and Well Groomed  Eye  Contact::  Fair  Speech:  Clear and Coherent and Normal Rate  Volume:  Normal  Mood:fair  Affect:  congruent  Thought Process:  Coherent, Linear and Logical  Orientation:  Full (Time, Place, and Person)  Thought Content:  WDL  Suicidal Thoughts:  No  Homicidal Thoughts:  No  Memory:  Immediate;   Good Recent;   Good Remote;   Good  Judgement:  Fair  Insight:  Fair  Psychomotor Activity:  Normal  Concentration:  Good  Recall:  Negative  Akathisia:  Negative  Language-Intact  Fund of Knowledge-Average  Handed:  Right  AIMS (if indicated):   As noted in chart.  Assets:  Communication Skills Desire for Improvement Financial Resources/Insurance Intimacy Leisure Time Physical Health Resilience Social Support Talents/Skills     Assessment: Schizoaffective Disorder  Axis I: Schizoaffective Disorder. GAD. Weight change  Plan:  1. Schizoaffective disorder:  Fair continue abilifyk may change to saphris as before in December when generic released 2. GAD: manageable. Continue prozac   reveiwed questions , has meds for now   I discussed the assessment and treatment plan with the patient. The patient was provided an opportunity to ask questions and all were answered. The patient agreed with the plan and demonstrated an understanding of the instructions.   The patient was advised to call back or seek an in-person evaluation if the symptoms worsen or if the condition fails to improve as anticipated. Fu 2-3 m.   06/19/2019 3:13 PM

## 2019-07-13 ENCOUNTER — Other Ambulatory Visit (HOSPITAL_COMMUNITY): Payer: Self-pay | Admitting: Psychiatry

## 2019-08-28 ENCOUNTER — Encounter: Payer: Self-pay | Admitting: Family Medicine

## 2019-09-01 ENCOUNTER — Ambulatory Visit (HOSPITAL_COMMUNITY): Payer: BC Managed Care – PPO | Admitting: Psychiatry

## 2019-09-18 ENCOUNTER — Ambulatory Visit (INDEPENDENT_AMBULATORY_CARE_PROVIDER_SITE_OTHER): Payer: BC Managed Care – PPO | Admitting: Psychiatry

## 2019-09-18 ENCOUNTER — Encounter (HOSPITAL_COMMUNITY): Payer: Self-pay | Admitting: Psychiatry

## 2019-09-18 ENCOUNTER — Ambulatory Visit (HOSPITAL_COMMUNITY): Payer: BC Managed Care – PPO | Admitting: Psychiatry

## 2019-09-18 ENCOUNTER — Other Ambulatory Visit: Payer: Self-pay

## 2019-09-18 DIAGNOSIS — F411 Generalized anxiety disorder: Secondary | ICD-10-CM | POA: Diagnosis not present

## 2019-09-18 DIAGNOSIS — F259 Schizoaffective disorder, unspecified: Secondary | ICD-10-CM | POA: Diagnosis not present

## 2019-09-18 MED ORDER — FLUOXETINE HCL 20 MG PO CAPS: 60.0000 mg | ORAL_CAPSULE | Freq: Every day | ORAL | 0 refills | Status: DC

## 2019-09-18 MED ORDER — ARIPIPRAZOLE 10 MG PO TABS: 10.0000 mg | ORAL_TABLET | Freq: Every day | ORAL | 0 refills | Status: DC

## 2019-09-18 NOTE — Progress Notes (Signed)
Patient ID: Ronald [name], male   DOB: 1966-03-18, 54 y.o.   MRN: [id number]   [place] Follow-up Outpatient Visit  KEYDEN WINTERMUTE May 02, 1966 [id number] 54 y.o. 09/18/2019 11:17 AM  I connected with [name] on 09/18/19 at 11:00 AM EST by telephone and verified that I am speaking with the correct person using two identifiers.     I discussed the limitations of evaluation and management by telemedicine and the availability of in person appointments. The patient expressed understanding and agreed to proceed. Chief Complaint: follow up . Med review  HPI Comments: Mr. Ruter is  a 54 y/o male with a past psychiatric history significant for Schizoaffective Disorder and Generalized anxiety disorder.   Brief history as per previous notes" Patient started having paranoid delusions in 2009"  saphris has helped in past.  Had retinal vein occlusion couldn't work or function, managing it and now had to let job go  Mood wise fair, pandemic and political situation worries him. Stays home as mom is old  No tremors   Some worries related to finance and thinking of going to [place] or other state if needed. Doesn't think people are after him    Modifying factor:meds and family  Past Medical Family, Social History:  Past Medical History:  Diagnosis Date  . Cancer (HCC)    Skin  . Hyperlipidemia 01/20/2015  . Schizoaffective disorder ([place]) 07/14/2008   Qualifier: Diagnosis of  By: [name], Barnetta Chapel    . Squamous cell skin cancer, ala nasi    Family History  Problem Relation Age of Onset  . Diabetes Mellitus II Brother   . Schizophrenia Maternal Uncle   . Alcohol abuse Neg Hx   . Anxiety disorder Neg Hx   . Bipolar disorder Neg Hx   . Dementia Neg Hx   . Depression Neg Hx   . Drug abuse Neg Hx   . Coronary artery disease Neg Hx    Social History   Socioeconomic History  . Marital status: Single    Spouse name: Not on file  . Number of children:  Not on file  . Years of education: Not on file  . Highest education level: Not on file  Occupational History  . Not on file  Tobacco Use  . Smoking status: Current Some Day Smoker    Packs/day: 1.00    Types: Cigarettes, Cigars    Last attempt to quit: 10/30/2008    Years since quitting: 10.8  . Smokeless tobacco: Never Used  . Tobacco comment: Smokes Cigars 1-2 several days a week.   Substance and Sexual Activity  . Alcohol use: Yes    Alcohol/week: 3.0 - 4.0 standard drinks    Types: 3 - 4 Cans of beer per week    Comment: non-alchoholic  . Drug use: No  . Sexual activity: Not Currently  Other Topics Concern  . Not on file  Social History Narrative  . Not on file   Social Determinants of Health   Financial Resource Strain:   . Difficulty of Paying Living Expenses: Not on file  Food Insecurity:   . Worried About Charity fundraiser in the Last Year: Not on file  . Ran Out of Food in the Last Year: Not on file  Transportation Needs:   . Lack of Transportation (Medical): Not on file  . Lack of Transportation (Non-Medical): Not on file  Physical Activity:   . Days of Exercise per Week: Not on file  .  Minutes of Exercise per Session: Not on file  Stress:   . Feeling of Stress : Not on file  Social Connections:   . Frequency of Communication with Friends and Family: Not on file  . Frequency of Social Gatherings with Friends and Family: Not on file  . Attends Religious Services: Not on file  . Active Member of Clubs or Organizations: Not on file  . Attends Archivist Meetings: Not on file  . Marital Status: Not on file    Outpatient Encounter Medications as of 09/18/2019  Medication Sig  . ARIPiprazole (ABILIFY) 10 MG tablet Take 1 tablet (10 mg total) by mouth daily.  Marland Kitchen FLUoxetine (PROZAC) 20 MG capsule Take 3 capsules (60 mg total) by mouth daily.  . [DISCONTINUED] ARIPiprazole (ABILIFY) 10 MG tablet Take 1 tablet (10 mg total) by mouth daily.  .  [DISCONTINUED] FLUoxetine (PROZAC) 20 MG capsule TAKE 3 CAPSULES BY MOUTH EVERY DAY   No facility-administered encounter medications on file as of 09/18/2019.     Review of Systems  Cardiovascular: Negative for chest pain.  Skin: Negative for rash.  Psychiatric/Behavioral: Negative for hallucinations and suicidal ideas.   There were no vitals filed for this visit.   Psychiatric Specialty Exam: General Appearance:   Eye Contact::   Speech:  Clear and Coherent and Normal Rate  Volume:  Normal  Mood: fair  Affect:  congruent  Thought Process:  Coherent, Linear and Logical  Orientation:  Full (Time, Place, and Person)  Thought Content:  WDL  Suicidal Thoughts:  No  Homicidal Thoughts:  No  Memory:  Immediate;   Good Recent;   Good Remote;   Good  Judgement:  Fair  Insight:  Fair  Psychomotor Activity:  Normal  Concentration:  Good  Recall:  Negative  Akathisia:  Negative  Language-Intact  Fund of Knowledge-Average  Handed:  Right  AIMS (if indicated):   As noted in chart.  Assets:  Communication Skills Desire for Improvement Financial Resources/Insurance Intimacy Leisure Time Physical Health Resilience Social Support Talents/Skills     Assessment: Schizoaffective Disorder  Axis I: Schizoaffective Disorder. GAD. Weight change  Plan:  1. Schizoaffective disorder:  Fair, continue abilify, no tremors 2. GAD: anxiety related to finances, continue prozac Would see him early this time in a month as for his worries and can call earlier if needed meds renewed  reveiwed questions  I discussed the assessment and treatment plan with the patient. The patient was provided an opportunity to ask questions and all were answered. The patient agreed with the plan and demonstrated an understanding of the instructions.   The patient was advised to call back or seek an in-person evaluation if the symptoms worsen or if the condition fails to improve as anticipated. Fu 46m.    09/18/2019 11:17 AM

## 2019-10-20 ENCOUNTER — Ambulatory Visit (INDEPENDENT_AMBULATORY_CARE_PROVIDER_SITE_OTHER): Payer: BC Managed Care – PPO | Admitting: Psychiatry

## 2019-10-20 ENCOUNTER — Encounter (HOSPITAL_COMMUNITY): Payer: Self-pay | Admitting: Psychiatry

## 2019-10-20 DIAGNOSIS — F411 Generalized anxiety disorder: Secondary | ICD-10-CM

## 2019-10-20 DIAGNOSIS — F259 Schizoaffective disorder, unspecified: Secondary | ICD-10-CM

## 2019-10-20 NOTE — Progress Notes (Signed)
Patient ID: Ronald [name], male   DOB: August 01, 1966, 55 y.o.   MRN: [id number]   [place] Follow-up Outpatient Visit  [name] 08/14/1966 [id number] 54 y.o. 10/20/2019 10:07 AM   I connected with [name] on 10/20/19 at 10:00 AM EST by telephone and verified that I am speaking with the correct person using two identifiers.    I discussed the limitations of evaluation and management by telemedicine and the availability of in person appointments. The patient expressed understanding and agreed to proceed. Chief Complaint: follow up . Med review  HPI Comments: [name] is  a 54 y/o male with a past psychiatric history significant for Schizoaffective Disorder and Generalized anxiety disorder.   Brief history as per previous notes" Patient started having paranoid delusions in 2009"  saphris has helped in past.  Had retinal vein occlusion couldn't work or function, managing it and now had to let job go  Early appointment due to pandemic and political worries,  But handling it better, watching less news and reading more  No tremors Mom got her vaccine so after her second he can start working  Doesn't think people are after him    Modifying factor:meds and family  Past Medical Family, Social History:  Past Medical History:  Diagnosis Date  . Cancer (HCC)    Skin  . Hyperlipidemia 01/20/2015  . Schizoaffective disorder ([place]) 07/14/2008   Qualifier: Diagnosis of  By: [name], Barnetta Chapel    . Squamous cell skin cancer, ala nasi    Family History  Problem Relation Age of Onset  . Diabetes Mellitus II Brother   . Schizophrenia Maternal Uncle   . Alcohol abuse Neg Hx   . Anxiety disorder Neg Hx   . Bipolar disorder Neg Hx   . Dementia Neg Hx   . Depression Neg Hx   . Drug abuse Neg Hx   . Coronary artery disease Neg Hx    Social History   Socioeconomic History  . Marital status: Single    Spouse name: Not on file  . Number of children:  Not on file  . Years of education: Not on file  . Highest education level: Not on file  Occupational History  . Not on file  Tobacco Use  . Smoking status: Current Some Day Smoker    Packs/day: 1.00    Types: Cigarettes, Cigars    Last attempt to quit: 10/30/2008    Years since quitting: 10.9  . Smokeless tobacco: Never Used  . Tobacco comment: Smokes Cigars 1-2 several days a week.   Substance and Sexual Activity  . Alcohol use: Yes    Alcohol/week: 3.0 - 4.0 standard drinks    Types: 3 - 4 Cans of beer per week    Comment: non-alchoholic  . Drug use: No  . Sexual activity: Not Currently  Other Topics Concern  . Not on file  Social History Narrative  . Not on file   Social Determinants of Health   Financial Resource Strain:   . Difficulty of Paying Living Expenses: Not on file  Food Insecurity:   . Worried About Charity fundraiser in the Last Year: Not on file  . Ran Out of Food in the Last Year: Not on file  Transportation Needs:   . Lack of Transportation (Medical): Not on file  . Lack of Transportation (Non-Medical): Not on file  Physical Activity:   . Days of Exercise per Week: Not on file  .  Minutes of Exercise per Session: Not on file  Stress:   . Feeling of Stress : Not on file  Social Connections:   . Frequency of Communication with Friends and Family: Not on file  . Frequency of Social Gatherings with Friends and Family: Not on file  . Attends Religious Services: Not on file  . Active Member of Clubs or Organizations: Not on file  . Attends Archivist Meetings: Not on file  . Marital Status: Not on file    Outpatient Encounter Medications as of 10/20/2019  Medication Sig  . ARIPiprazole (ABILIFY) 10 MG tablet Take 1 tablet (10 mg total) by mouth daily.  Marland Kitchen FLUoxetine (PROZAC) 20 MG capsule Take 3 capsules (60 mg total) by mouth daily.   No facility-administered encounter medications on file as of 10/20/2019.     Review of Systems   Cardiovascular: Negative for chest pain.  Skin: Negative for rash.  Psychiatric/Behavioral: Negative for hallucinations and suicidal ideas.   There were no vitals filed for this visit.   Psychiatric Specialty Exam: General Appearance:   Eye Contact::   Speech:  Clear and Coherent and Normal Rate  Volume:  Normal  Mood: fair  Affect:  congruent  Thought Process:  Coherent, Linear and Logical  Orientation:  Full (Time, Place, and Person)  Thought Content:  WDL  Suicidal Thoughts:  No  Homicidal Thoughts:  No  Memory:  Immediate;   Good Recent;   Good Remote;   Good  Judgement:  Fair  Insight:  Fair  Psychomotor Activity:  Normal  Concentration:  Good  Recall:  Negative  Akathisia:  Negative  Language-Intact  Fund of Knowledge-Average  Handed:  Right  AIMS (if indicated):   As noted in chart.  Assets:  Communication Skills Desire for Improvement Financial Resources/Insurance Intimacy Leisure Time Physical Health Resilience Social Support Talents/Skills     Assessment: Schizoaffective Disorder  Axis I: Schizoaffective Disorder. GAD. Weight change  Plan:  1. Schizoaffective disorder:  Fair, continue abilify,  2. GAD: anxiety some better, waiting for mom second vaccine to add work then Continue prozac  reveiwed questions  I discussed the assessment and treatment plan with the patient. The patient was provided an opportunity to ask questions and all were answered. The patient agreed with the plan and demonstrated an understanding of the instructions.   The patient was advised to call back or seek an in-person evaluation if the symptoms worsen or if the condition fails to improve as anticipated. Fu 64m.   10/20/2019 10:07 AM

## 2019-12-04 ENCOUNTER — Ambulatory Visit: Payer: BC Managed Care – PPO | Attending: Internal Medicine

## 2019-12-04 DIAGNOSIS — Z23 Encounter for immunization: Secondary | ICD-10-CM

## 2019-12-04 NOTE — Progress Notes (Signed)
   Covid-19 Vaccination Clinic  Name:  Ronald [name]    MRN: [id number] DOB: 1966-03-05  12/04/2019  Mr. [name] was observed post Covid-19 immunization for 15 minutes without incident. He was provided with Vaccine Information Sheet and instruction to access the V-Safe system.   Mr. [name] was instructed to call 911 with any severe reactions post vaccine: Marland Kitchen Difficulty breathing  . Swelling of face and throat  . A fast heartbeat  . A bad rash all over body  . Dizziness and weakness   Immunizations Administered    Name Date Dose VIS Date Route   Pfizer COVID-19 Vaccine 12/04/2019 12:41 PM 0.3 mL 08/22/2019 Intramuscular   Manufacturer: [place]   Lot: [id number]   [place]: [id number]

## 2019-12-23 ENCOUNTER — Other Ambulatory Visit (HOSPITAL_COMMUNITY): Payer: Self-pay | Admitting: Psychiatry

## 2019-12-23 ENCOUNTER — Ambulatory Visit (INDEPENDENT_AMBULATORY_CARE_PROVIDER_SITE_OTHER): Payer: BC Managed Care – PPO | Admitting: Psychiatry

## 2019-12-23 ENCOUNTER — Encounter (HOSPITAL_COMMUNITY): Payer: Self-pay | Admitting: Psychiatry

## 2019-12-23 DIAGNOSIS — F411 Generalized anxiety disorder: Secondary | ICD-10-CM

## 2019-12-23 DIAGNOSIS — F259 Schizoaffective disorder, unspecified: Secondary | ICD-10-CM | POA: Diagnosis not present

## 2019-12-23 NOTE — Progress Notes (Signed)
Patient ID: Ronald [name], male   DOB: 1966/08/06, 54 y.o.   MRN: [id number]   [place] Follow-up Outpatient Visit  DUKE MULLINEAUX 1966-09-01 [id number] 54 y.o. 12/23/2019 2:40 PM   I connected with Ronald [name] on 12/23/19 at  2:30 PM EDT by telephone and verified that I am speaking with the correct person using two identifiers.   I discussed the limitations of evaluation and management by telemedicine and the availability of in person appointments. The patient expressed understanding and agreed to proceed. Chief Complaint: follow up . Med review  HPI Comments: Ronald [name] is  a 54  y/o male with a past psychiatric history significant for Schizoaffective Disorder and Generalized anxiety disorder.   Brief history as per previous notes" Patient started having paranoid delusions in 2009"  saphris has helped in past.   currntly out of work due to retinal vein occlusion but may start looking again after having his covid vaccine  Doing fair otherwise. No paranoia  No tremors Mom got her vaccine so after her second he can start working  Doesn't think people are after him  Modifying factor:meds and family  Past Medical Family, Social History:  Past Medical History:  Diagnosis Date  . Cancer (HCC)    Skin  . Hyperlipidemia 01/20/2015  . Schizoaffective disorder ([place]) 07/14/2008   Qualifier: Diagnosis of  By: Ronald [name], Ronald [name]    . Squamous cell skin cancer, ala nasi    Family History  Problem Relation Age of Onset  . Diabetes Mellitus II Brother   . Schizophrenia Maternal Uncle   . Alcohol abuse Neg Hx   . Anxiety disorder Neg Hx   . Bipolar disorder Neg Hx   . Dementia Neg Hx   . Depression Neg Hx   . Drug abuse Neg Hx   . Coronary artery disease Neg Hx    Social History   Socioeconomic History  . Marital status: Single    Spouse name: Not on file  . Number of children: Not on file  . Years of education: Not on file  . Highest education  level: Not on file  Occupational History  . Not on file  Tobacco Use  . Smoking status: Current Some Day Smoker    Packs/day: 1.00    Types: Cigarettes, Cigars    Last attempt to quit: 10/30/2008    Years since quitting: 11.1  . Smokeless tobacco: Never Used  . Tobacco comment: Smokes Cigars 1-2 several days a week.   Substance and Sexual Activity  . Alcohol use: Yes    Alcohol/week: 3.0 - 4.0 standard drinks    Types: 3 - 4 Cans of beer per week    Comment: non-alchoholic  . Drug use: No  . Sexual activity: Not Currently  Other Topics Concern  . Not on file  Social History Narrative  . Not on file   Social Determinants of Health   Financial Resource Strain:   . Difficulty of Paying Living Expenses:   Food Insecurity:   . Worried About Charity fundraiser in the Last Year:   . Arboriculturist in the Last Year:   Transportation Needs:   . Film/video editor (Medical):   Marland Kitchen Lack of Transportation (Non-Medical):   Physical Activity:   . Days of Exercise per Week:   . Minutes of Exercise per Session:   Stress:   . Feeling of Stress :   Social Connections:   . Frequency of  Communication with Friends and Family:   . Frequency of Social Gatherings with Friends and Family:   . Attends Religious Services:   . Active Member of Clubs or Organizations:   . Attends Archivist Meetings:   Marland Kitchen Marital Status:     Outpatient Encounter Medications as of 12/23/2019  Medication Sig  . ARIPiprazole (ABILIFY) 10 MG tablet TAKE 1 TABLET BY MOUTH EVERY DAY  . FLUoxetine (PROZAC) 20 MG capsule Take 3 capsules (60 mg total) by mouth daily.   No facility-administered encounter medications on file as of 12/23/2019.     Review of Systems  Cardiovascular: Negative for chest pain.  Skin: Negative for rash.  Psychiatric/Behavioral: Negative for hallucinations and suicidal ideas.   There were no vitals filed for this visit.   Psychiatric Specialty Exam: General Appearance:    Eye Contact::   Speech:  Clear and Coherent and Normal Rate  Volume:  Normal  Mood: fair  Affect:  congruent  Thought Process:  Coherent, Linear and Logical  Orientation:  Full (Time, Place, and Person)  Thought Content:  WDL  Suicidal Thoughts:  No  Homicidal Thoughts:  No  Memory:  Immediate;   Good Recent;   Good Remote;   Good  Judgement:  Fair  Insight:  Fair  Psychomotor Activity:  Normal  Concentration:  Good  Recall:  Negative  Akathisia:  Negative  Language-Intact  Fund of Knowledge-Average  Handed:  Right  AIMS (if indicated):   As noted in chart.  Assets:  Communication Skills Desire for Improvement Financial Resources/Insurance Intimacy Leisure Time Physical Health Resilience Social Support Talents/Skills     Assessment: Schizoaffective Disorder  Axis I: Schizoaffective Disorder. GAD. Weight change  Plan:  1. Schizoaffective disorder:  Baseline, denies paranoia, continue abilify, no tremors 2. GAD: anxiety some better, waiting for mom second vaccine to add work then Continue prozac  reveiwed questions  I discussed the assessment and treatment plan with the patient. The patient was provided an opportunity to ask questions and all were answered. The patient agreed with the plan and demonstrated an understanding of the instructions.   The patient was advised to call back or seek an in-person evaluation if the symptoms worsen or if the condition fails to improve as anticipated. Fu 70m  Non face to face time spent: 2min   12/23/2019 2:40 PM

## 2019-12-29 ENCOUNTER — Ambulatory Visit: Payer: BC Managed Care – PPO | Attending: Internal Medicine

## 2019-12-29 DIAGNOSIS — Z23 Encounter for immunization: Secondary | ICD-10-CM

## 2019-12-29 NOTE — Progress Notes (Signed)
   Covid-19 Vaccination Clinic  Name:  Ronald [name]    MRN: [id number] DOB: 1966/02/03  12/29/2019  [name] was observed post Covid-19 immunization for 15 minutes without incident. He was provided with Vaccine Information Sheet and instruction to access the V-Safe system.   [name] was instructed to call 911 with any severe reactions post vaccine: Marland Kitchen Difficulty breathing  . Swelling of face and throat  . A fast heartbeat  . A bad rash all over body  . Dizziness and weakness   Immunizations Administered    Name Date Dose VIS Date Route   Pfizer COVID-19 Vaccine 12/29/2019 12:18 PM 0.3 mL 11/05/2018 Intramuscular   Manufacturer: [place]   Lot: [id number]   [place]: [id number]

## 2020-03-01 ENCOUNTER — Telehealth (HOSPITAL_COMMUNITY): Payer: Self-pay | Admitting: Psychiatry

## 2020-03-01 NOTE — Telephone Encounter (Signed)
Pt needs refill on abilify [facility].

## 2020-03-02 MED ORDER — ARIPIPRAZOLE 10 MG PO TABS: 10.0000 mg | ORAL_TABLET | Freq: Every day | ORAL | 0 refills | Status: DC

## 2020-03-02 NOTE — Telephone Encounter (Signed)
sent 

## 2020-03-24 ENCOUNTER — Telehealth (INDEPENDENT_AMBULATORY_CARE_PROVIDER_SITE_OTHER): Payer: BC Managed Care – PPO | Admitting: Psychiatry

## 2020-03-24 ENCOUNTER — Encounter (HOSPITAL_COMMUNITY): Payer: Self-pay | Admitting: Psychiatry

## 2020-03-24 DIAGNOSIS — F411 Generalized anxiety disorder: Secondary | ICD-10-CM

## 2020-03-24 DIAGNOSIS — F259 Schizoaffective disorder, unspecified: Secondary | ICD-10-CM

## 2020-03-24 NOTE — Progress Notes (Signed)
Patient ID: Ronald [name], male   DOB: 09-19-1965, 54 y.o.   MRN: [id number]   [place] Follow-up Outpatient Visit  ZANIEL MARINEAU Mar 28, 1966 [id number] 54 y.o. 03/24/2020 1:48 PM   I connected with [name] on 03/24/20 at  1:30 PM EDT by telephone and verified that I am speaking with the correct person using two identifiers.    I discussed the limitations of evaluation and management by telemedicine and the availability of in person appointments. The patient expressed understanding and agreed to proceed.  Patient location: home Provider location : home  Chief Complaint: follow up . Med review  HPI Comments: Mr. Swander is  a 54  y/o male with a past psychiatric history significant for Schizoaffective Disorder and Generalized anxiety disorder.   Brief history as per previous notes" Patient started having paranoid delusions in 2009"  saphris has helped in past.   currntly out of work due to retinal vein occlusion.  Has got vaccine so looking for other jobs  Doing fair otherwise. No paranoia Still feels saphris worked better but waiting for its price to get more affordable, wants to keep abilify for now No tremors Mom got her vaccine so after her second he can start working  Doesn't think people are after him  Modifying factor:meds and family  Past Medical Family, Social History:  Past Medical History:  Diagnosis Date  . Cancer (HCC)    Skin  . Hyperlipidemia 01/20/2015  . Schizoaffective disorder ([place]) 07/14/2008   Qualifier: Diagnosis of  By: [name], Barnetta Chapel    . Squamous cell skin cancer, ala nasi    Family History  Problem Relation Age of Onset  . Diabetes Mellitus II Brother   . Schizophrenia Maternal Uncle   . Alcohol abuse Neg Hx   . Anxiety disorder Neg Hx   . Bipolar disorder Neg Hx   . Dementia Neg Hx   . Depression Neg Hx   . Drug abuse Neg Hx   . Coronary artery disease Neg Hx    Social History   Socioeconomic  History  . Marital status: Single    Spouse name: Not on file  . Number of children: Not on file  . Years of education: Not on file  . Highest education level: Not on file  Occupational History  . Not on file  Tobacco Use  . Smoking status: Current Some Day Smoker    Packs/day: 1.00    Types: Cigarettes, Cigars    Last attempt to quit: 10/30/2008    Years since quitting: 11.4  . Smokeless tobacco: Never Used  . Tobacco comment: Smokes Cigars 1-2 several days a week.   Vaping Use  . Vaping Use: Never used  Substance and Sexual Activity  . Alcohol use: Yes    Alcohol/week: 3.0 - 4.0 standard drinks    Types: 3 - 4 Cans of beer per week    Comment: non-alchoholic  . Drug use: No  . Sexual activity: Not Currently  Other Topics Concern  . Not on file  Social History Narrative  . Not on file   Social Determinants of Health   Financial Resource Strain:   . Difficulty of Paying Living Expenses:   Food Insecurity:   . Worried About Charity fundraiser in the Last Year:   . Arboriculturist in the Last Year:   Transportation Needs:   . Film/video editor (Medical):   Marland Kitchen Lack of Transportation (Non-Medical):  Physical Activity:   . Days of Exercise per Week:   . Minutes of Exercise per Session:   Stress:   . Feeling of Stress :   Social Connections:   . Frequency of Communication with Friends and Family:   . Frequency of Social Gatherings with Friends and Family:   . Attends Religious Services:   . Active Member of Clubs or Organizations:   . Attends Archivist Meetings:   Marland Kitchen Marital Status:     Outpatient Encounter Medications as of 03/24/2020  Medication Sig  . ARIPiprazole (ABILIFY) 10 MG tablet Take 1 tablet (10 mg total) by mouth daily.  Marland Kitchen FLUoxetine (PROZAC) 20 MG capsule Take 3 capsules (60 mg total) by mouth daily.   No facility-administered encounter medications on file as of 03/24/2020.     Review of Systems  Cardiovascular: Negative for chest  pain.  Skin: Negative for rash.  Psychiatric/Behavioral: Negative for hallucinations and suicidal ideas.   There were no vitals filed for this visit.   Psychiatric Specialty Exam: General Appearance:   Eye Contact::   Speech:  Clear and Coherent and Normal Rate  Volume:  Normal  Mood: fair  Affect:  congruent  Thought Process:  Coherent, Linear and Logical  Orientation:  Full (Time, Place, and Person)  Thought Content:  WDL  Suicidal Thoughts:  No  Homicidal Thoughts:  No  Memory:  Immediate;   Good Recent;   Good Remote;   Good  Judgement:  Fair  Insight:  Fair  Psychomotor Activity:  Normal  Concentration:  Good  Recall:  Negative  Akathisia:  Negative  Language-Intact  Fund of Knowledge-Average  Handed:  Right  AIMS (if indicated):   As noted in chart.  Assets:  Communication Skills Desire for Improvement Financial Resources/Insurance Intimacy Leisure Time Physical Health Resilience Social Support Talents/Skills     Assessment: Schizoaffective Disorder  Axis I: Schizoaffective Disorder. GAD. Weight change  Plan:  1. Schizoaffective disorder:  Baseline, continue abilify, prozac  2. GAD: anxiety manageable on prozac, may look for jobs reveiwed questions  I discussed the assessment and treatment plan with the patient. The patient was provided an opportunity to ask questions and all were answered. The patient agreed with the plan and demonstrated an understanding of the instructions.   The patient was advised to call back or seek an in-person evaluation if the symptoms worsen or if the condition fails to improve as anticipated. Fu 48m  Non face to face time spent: 27min   03/24/2020 1:48 PM

## 2020-05-06 ENCOUNTER — Telehealth (HOSPITAL_COMMUNITY): Payer: Self-pay | Admitting: Psychiatry

## 2020-05-06 MED ORDER — TRAZODONE HCL 50 MG PO TABS: 50.0000 mg | ORAL_TABLET | Freq: Every day | ORAL | 0 refills | Status: DC

## 2020-05-06 NOTE — Telephone Encounter (Signed)
Pt states he is not sleeping very well and would like another rx for trazadone [facility]

## 2020-05-06 NOTE — Telephone Encounter (Signed)
Ok sent 50mg  qhs

## 2020-05-09 ENCOUNTER — Other Ambulatory Visit (HOSPITAL_COMMUNITY): Payer: Self-pay | Admitting: Psychiatry

## 2020-05-28 ENCOUNTER — Other Ambulatory Visit (HOSPITAL_COMMUNITY): Payer: Self-pay | Admitting: Psychiatry

## 2020-06-15 ENCOUNTER — Telehealth (HOSPITAL_COMMUNITY): Payer: Self-pay | Admitting: Psychiatry

## 2020-06-15 MED ORDER — ARIPIPRAZOLE 10 MG PO TABS: 10.0000 mg | ORAL_TABLET | Freq: Every day | ORAL | 0 refills | Status: DC

## 2020-06-15 NOTE — Telephone Encounter (Signed)
Sent abilify Prozac not due

## 2020-06-15 NOTE — Telephone Encounter (Signed)
Pt needs refill on abilify and prozac  [facility]

## 2020-06-24 ENCOUNTER — Telehealth (INDEPENDENT_AMBULATORY_CARE_PROVIDER_SITE_OTHER): Payer: BC Managed Care – PPO | Admitting: Psychiatry

## 2020-06-24 ENCOUNTER — Encounter (HOSPITAL_COMMUNITY): Payer: Self-pay | Admitting: Psychiatry

## 2020-06-24 DIAGNOSIS — F259 Schizoaffective disorder, unspecified: Secondary | ICD-10-CM

## 2020-06-24 DIAGNOSIS — F411 Generalized anxiety disorder: Secondary | ICD-10-CM | POA: Diagnosis not present

## 2020-06-24 NOTE — Progress Notes (Signed)
Patient ID: NEHEMYAH FOUSHEE, male   DOB: 21-Jan-1966, 54 y.o.   MRN: [id number]   [place] Follow-up Outpatient Visit  DELMO MATTY 19-Jul-1966 [id number] 54 y.o. 06/24/2020 4:24 PM    I connected with [name] on 06/24/20 at  4:15 PM EDT by telephone and verified that I am speaking with the correct person using two identifiers.    I discussed the limitations of evaluation and management by telemedicine and the availability of in person appointments. The patient expressed understanding and agreed to proceed.  Patient location: home Provider location : home office  Chief Complaint: follow up . Med review  HPI Comments: Mr. Torregrossa is  a 54  y/o male with a past psychiatric history significant for Schizoaffective Disorder and Generalized anxiety disorder.   Brief history as per previous notes" Patient started having paranoid delusions in 2009"  saphris has helped in past.   currntly out of work due to retinal vein occlusion.  Has got vaccine so looking for other jobs  Staying at home takes nap here or there, had called for trazadone as night sleep got effected  May change to saphris once cost effective otherwise satisfied with current meds Mom got her vaccine so after her second he can start working  Doesn't think people are after him  Modifying factor:meds and family  Past Medical Family, Social History:  Past Medical History:  Diagnosis Date  . Cancer (HCC)    Skin  . Hyperlipidemia 01/20/2015  . Schizoaffective disorder ([place]) 07/14/2008   Qualifier: Diagnosis of  By: [name], Barnetta Chapel    . Squamous cell skin cancer, ala nasi    Family History  Problem Relation Age of Onset  . Diabetes Mellitus II Brother   . Schizophrenia Maternal Uncle   . Alcohol abuse Neg Hx   . Anxiety disorder Neg Hx   . Bipolar disorder Neg Hx   . Dementia Neg Hx   . Depression Neg Hx   . Drug abuse Neg Hx   . Coronary artery disease Neg Hx    Social  History   Socioeconomic History  . Marital status: Single    Spouse name: Not on file  . Number of children: Not on file  . Years of education: Not on file  . Highest education level: Not on file  Occupational History  . Not on file  Tobacco Use  . Smoking status: Current Some Day Smoker    Packs/day: 1.00    Types: Cigarettes, Cigars    Last attempt to quit: 10/30/2008    Years since quitting: 11.6  . Smokeless tobacco: Never Used  . Tobacco comment: Smokes Cigars 1-2 several days a week.   Vaping Use  . Vaping Use: Never used  Substance and Sexual Activity  . Alcohol use: Yes    Alcohol/week: 3.0 - 4.0 standard drinks    Types: 3 - 4 Cans of beer per week    Comment: non-alchoholic  . Drug use: No  . Sexual activity: Not Currently  Other Topics Concern  . Not on file  Social History Narrative  . Not on file   Social Determinants of Health   Financial Resource Strain:   . Difficulty of Paying Living Expenses: Not on file  Food Insecurity:   . Worried About Charity fundraiser in the Last Year: Not on file  . Ran Out of Food in the Last Year: Not on file  Transportation Needs:   . Lack of  Transportation (Medical): Not on file  . Lack of Transportation (Non-Medical): Not on file  Physical Activity:   . Days of Exercise per Week: Not on file  . Minutes of Exercise per Session: Not on file  Stress:   . Feeling of Stress : Not on file  Social Connections:   . Frequency of Communication with Friends and Family: Not on file  . Frequency of Social Gatherings with Friends and Family: Not on file  . Attends Religious Services: Not on file  . Active Member of Clubs or Organizations: Not on file  . Attends Archivist Meetings: Not on file  . Marital Status: Not on file    Outpatient Encounter Medications as of 06/24/2020  Medication Sig  . ARIPiprazole (ABILIFY) 10 MG tablet Take 1 tablet (10 mg total) by mouth daily.  Marland Kitchen FLUoxetine (PROZAC) 20 MG capsule TAKE  3 CAPSULES BY MOUTH EVERY DAY  . traZODone (DESYREL) 50 MG tablet TAKE 1 TABLET BY MOUTH EVERYDAY AT BEDTIME   No facility-administered encounter medications on file as of 06/24/2020.     Review of Systems  Cardiovascular: Negative for chest pain.  Skin: Negative for rash.  Psychiatric/Behavioral: Negative for hallucinations and suicidal ideas.   There were no vitals filed for this visit.   Psychiatric Specialty Exam: General Appearance:   Eye Contact::   Speech:  Clear and Coherent and Normal Rate  Volume:  Normal  Mood: fair  Affect:  congruent  Thought Process:  Coherent, Linear and Logical  Orientation:  Full (Time, Place, and Person)  Thought Content:  WDL  Suicidal Thoughts:  No  Homicidal Thoughts:  No  Memory:  Immediate;   Good Recent;   Good Remote;   Good  Judgement:  Fair  Insight:  Fair  Psychomotor Activity:  Normal  Concentration:  Good  Recall:  Negative  Akathisia:  Negative  Language-Intact  Fund of Knowledge-Average  Handed:  Right  AIMS (if indicated):   As noted in chart.  Assets:  Communication Skills Desire for Improvement Financial Resources/Insurance Intimacy Leisure Time Physical Health Resilience Social Support Talents/Skills     Assessment: Schizoaffective Disorder  Axis I: Schizoaffective Disorder. GAD. Weight change  Plan:  1. Schizoaffective disorder:  Baseline, continue abilify, prozac 2. GAD:  Looking for job, financial stress, continue prozac  reveiwed questions  I discussed the assessment and treatment plan with the patient. The patient was provided an opportunity to ask questions and all were answered. The patient agreed with the plan and demonstrated an understanding of the instructions.   The patient was advised to call back or seek an in-person evaluation if the symptoms worsen or if the condition fails to improve as anticipated. Fu 88m  Non face to face time spent: 87min   06/24/2020 4:24  PM

## 2020-08-29 ENCOUNTER — Encounter: Payer: Self-pay | Admitting: Family Medicine

## 2020-08-30 ENCOUNTER — Telehealth: Payer: Self-pay | Admitting: *Deleted

## 2020-08-30 NOTE — Telephone Encounter (Signed)
LVM advising pt that should his sxs persist that he should schedule a virtual visit or if he doesn't have any sxs and his test is negative that he can schedule an in person visit.

## 2020-08-30 NOTE — Telephone Encounter (Signed)
Okay, not sure who gave him the medical advice but if he still having persistent symptoms later this week then we can always do a virtual visit if he needs 1.  Or if he is not a negative test we can always see him in person if needed.

## 2020-10-25 ENCOUNTER — Encounter (HOSPITAL_COMMUNITY): Payer: Self-pay | Admitting: Psychiatry

## 2020-10-25 ENCOUNTER — Telehealth (HOSPITAL_COMMUNITY): Payer: BC Managed Care – PPO | Admitting: Psychiatry

## 2020-10-25 ENCOUNTER — Telehealth (INDEPENDENT_AMBULATORY_CARE_PROVIDER_SITE_OTHER): Payer: BC Managed Care – PPO | Admitting: Psychiatry

## 2020-10-25 DIAGNOSIS — F411 Generalized anxiety disorder: Secondary | ICD-10-CM | POA: Diagnosis not present

## 2020-10-25 DIAGNOSIS — F259 Schizoaffective disorder, unspecified: Secondary | ICD-10-CM | POA: Diagnosis not present

## 2020-10-25 MED ORDER — FLUOXETINE HCL 20 MG PO CAPS: 60.0000 mg | ORAL_CAPSULE | Freq: Every day | ORAL | 0 refills | Status: DC

## 2020-10-25 MED ORDER — ARIPIPRAZOLE 10 MG PO TABS: 10.0000 mg | ORAL_TABLET | Freq: Every day | ORAL | 0 refills | Status: DC

## 2020-10-25 NOTE — Progress Notes (Signed)
Patient ID: Ronald [name], male   DOB: [birth year], 55 y.o.   MRN: [id number]   [facility] Follow-up Outpatient Visit  AVIYON HOCEVAR [birth year] [id number] 55 y.o. 10/25/2020 1:15 PM    Virtual Visit via Telephone Note  I connected with Ronald [name] on 10/25/20 at  1:00 PM EST by telephone and verified that I am speaking with the correct person using two identifiers.  Location: Patient: home Provider: home office   I discussed the limitations, risks, security and privacy concerns of performing an evaluation and management service by telephone and the availability of in person appointments. I also discussed with the patient that there may be a patient responsible charge related to this service. The patient expressed understanding and agreed to proceed.   History of Present Illness:    Observations/Objective:   Assessment and Plan:   Follow Up Instructions:    I discussed the assessment and treatment plan with the patient. The patient was provided an opportunity to ask questions and all were answered. The patient agreed with the plan and demonstrated an understanding of the instructions.   The patient was advised to call back or seek an in-person evaluation if the symptoms worsen or if the condition fails to improve as anticipated.  I provided 15  minutes of non-face-to-face time during this encounter.   Merian Capron, MD   Chief Complaint: follow up . Med review  HPI Comments: Ronald [name] is  a 55  y/o male with a past psychiatric history significant for Schizoaffective Disorder and Generalized anxiety disorder.   Brief history as per previous notes" Patient started having paranoid delusions in 2009"  saphris has helped in past.   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 Cancer (HCC)    Skin  . Hyperlipidemia 01/20/2015  . Schizoaffective disorder ([place]) 07/14/2008   Qualifier: Diagnosis of  By: [name], Barnetta Chapel    . Squamous cell skin cancer, ala nasi    Family History  Problem Relation Age of Onset  . Diabetes Mellitus II Brother   . Schizophrenia Maternal Uncle   . Alcohol abuse Neg Hx   . Anxiety disorder Neg Hx   . Bipolar disorder Neg Hx   . Dementia Neg Hx   . Depression Neg Hx   . Drug abuse Neg Hx   . Coronary artery disease Neg Hx    Social History   Socioeconomic History  . Marital status: Single    Spouse name: Not on file  . Number of children: Not on file  . Years of education: Not on file  . Highest education level: Not on file  Occupational History  . Not on file  Tobacco Use  . Smoking status: Current Some Day Smoker    Packs/day: 1.00    Types: Cigarettes, Cigars    Last attempt to quit: 10/30/2008    Years since quitting: 11.9  . Smokeless tobacco: Never Used  . Tobacco comment: Smokes Cigars 1-2 several days a week.   Vaping Use  . Vaping Use: Never used  Substance and Sexual Activity  . Alcohol use: Yes    Alcohol/week: 3.0 - 4.0 standard drinks    Types: 3 -  4 Cans of beer per week    Comment: non-alchoholic  . Drug use: No  . Sexual activity: Not Currently  Other Topics Concern  . Not on file  Social History Narrative  . Not on file   Social Determinants of Health   Financial Resource Strain: Not on file  Food Insecurity: Not on file  Transportation Needs: Not on file  Physical Activity: Not on file  Stress: Not on file  Social Connections: Not on file    Outpatient Encounter Medications as of 10/25/2020  Medication Sig  . ARIPiprazole (ABILIFY) 10 MG tablet Take 1 tablet (10 mg total) by mouth daily.  Marland Kitchen FLUoxetine (PROZAC) 20 MG capsule Take 3 capsules (60 mg total)  by mouth daily.  . traZODone (DESYREL) 50 MG tablet TAKE 1 TABLET BY MOUTH EVERYDAY AT BEDTIME  . [DISCONTINUED] ARIPiprazole (ABILIFY) 10 MG tablet Take 1 tablet (10 mg total) by mouth daily.  . [DISCONTINUED] FLUoxetine (PROZAC) 20 MG capsule TAKE 3 CAPSULES BY MOUTH EVERY DAY   No facility-administered encounter medications on file as of 10/25/2020.     Review of Systems  Cardiovascular: Negative for chest pain.  Skin: Negative for rash.  Psychiatric/Behavioral: Negative for hallucinations and suicidal ideas.   There were no vitals filed for this visit.   Psychiatric Specialty Exam: General Appearance:   Eye Contact::   Speech:  Clear and Coherent and Normal Rate  Volume:  Normal  Mood: fair  Affect:  congruent  Thought Process:  Coherent, Linear and Logical  Orientation:  Full (Time, Place, and Person)  Thought Content:  WDL  Suicidal Thoughts:  No  Homicidal Thoughts:  No  Memory:  Immediate;   Good Recent;   Good Remote;   Good  Judgement:  Fair  Insight:  Fair  Psychomotor Activity:  Normal  Concentration:  Good  Recall:  Negative  Akathisia:  Negative  Language-Intact  Fund of Knowledge-Average  Handed:  Right  AIMS (if indicated):   As noted in chart.  Assets:  Communication Skills Desire for Improvement Financial Resources/Insurance Intimacy Leisure Time Physical Health Resilience Social Support Talents/Skills   Prior documentation reviewed  Assessment: Schizoaffective Disorder  Axis I: Schizoaffective Disorder. GAD. Weight change  Plan:  1. Schizoaffective disorder: doing better, continue prozac, abilify 2. GAD:  Having a job has helped Discussed sleep hygiene   Fu 65m. Renewed meds which were due  10/25/2020 1:15 PM

## 2020-11-05 ENCOUNTER — Telehealth (HOSPITAL_COMMUNITY): Payer: Self-pay | Admitting: Psychiatry

## 2020-11-05 NOTE — Telephone Encounter (Signed)
[place]

## 2021-01-20 ENCOUNTER — Ambulatory Visit (INDEPENDENT_AMBULATORY_CARE_PROVIDER_SITE_OTHER): Payer: BC Managed Care – PPO | Admitting: Psychiatry

## 2021-01-20 ENCOUNTER — Encounter (HOSPITAL_COMMUNITY): Payer: Self-pay | Admitting: Psychiatry

## 2021-01-20 VITALS — BP 124/80 | Temp 98.7°F | Ht 71.0 in | Wt 207.0 lb

## 2021-01-20 DIAGNOSIS — F259 Schizoaffective disorder, unspecified: Secondary | ICD-10-CM | POA: Diagnosis not present

## 2021-01-20 DIAGNOSIS — F411 Generalized anxiety disorder: Secondary | ICD-10-CM

## 2021-01-20 MED ORDER — ARIPIPRAZOLE 10 MG PO TABS: 10.0000 mg | ORAL_TABLET | Freq: Every day | ORAL | 0 refills | Status: DC

## 2021-01-20 MED ORDER — FLUOXETINE HCL 20 MG PO CAPS: 60.0000 mg | ORAL_CAPSULE | Freq: Every day | ORAL | 0 refills | Status: DC

## 2021-01-20 NOTE — Progress Notes (Signed)
Patient ID: Ronald [name], male   DOB: [birth year], 55 y.o.   MRN: [id number]   [facility] Follow-up Outpatient Visit  LEARY MCNULTY 1965/10/01 [id number] 55 y.o. 01/20/2021 1:45 PM    Chief Complaint: follow up . Med review  HPI Comments:  Brief history as per previous notes" Patient started having paranoid delusions in 2009"  saphris has helped in past.   Doing fair, works night shift meds keep balance, not have paranoia  No tremors  Doesn't think people are after him  Modifying factor: medications, job  Past Medical Family, Social History:  Past Medical History:  Diagnosis Date  . Cancer (HCC)    Skin  . Hyperlipidemia 01/20/2015  . Schizoaffective disorder ([place]) 07/14/2008   Qualifier: Diagnosis of  By: [name], Barnetta Chapel    . Squamous cell skin cancer, ala nasi    Family History  Problem Relation Age of Onset  . Diabetes Mellitus II Brother   . Schizophrenia Maternal Uncle   . Alcohol abuse Neg Hx   . Anxiety disorder Neg Hx   . Bipolar disorder Neg Hx   . Dementia Neg Hx   . Depression Neg Hx   . Drug abuse Neg Hx   . Coronary artery disease Neg Hx    Social History   Socioeconomic History  . Marital status: Single    Spouse name: Not on file  . Number of children: Not on file  . Years of education: Not on file  . Highest education level: Not on file  Occupational History  . Not on file  Tobacco Use  . Smoking status: Current Some Day Smoker    Packs/day: 1.00    Types: Cigarettes, Cigars    Last attempt to quit: 10/30/2008    Years since quitting: 12.2  . Smokeless tobacco: Never Used  . Tobacco comment: Smokes Cigars 1-2 several days a week.   Vaping Use  . Vaping Use: Never used  Substance and Sexual Activity  . Alcohol use: Yes    Alcohol/week: 3.0 - 4.0 standard drinks    Types: 3 - 4 Cans of beer per week    Comment: non-alchoholic  . Drug use: No  . Sexual activity: Not Currently  Other Topics Concern  . Not on  file  Social History Narrative  . Not on file   Social Determinants of Health   Financial Resource Strain: Not on file  Food Insecurity: Not on file  Transportation Needs: Not on file  Physical Activity: Not on file  Stress: Not on file  Social Connections: Not on file    Outpatient Encounter Medications as of 01/20/2021  Medication Sig  . [DISCONTINUED] ARIPiprazole (ABILIFY) 10 MG tablet Take 1 tablet (10 mg total) by mouth daily.  . [DISCONTINUED] FLUoxetine (PROZAC) 20 MG capsule Take 3 capsules (60 mg total) by mouth daily.  . [DISCONTINUED] traZODone (DESYREL) 50 MG tablet TAKE 1 TABLET BY MOUTH EVERYDAY AT BEDTIME  . ARIPiprazole (ABILIFY) 10 MG tablet Take 1 tablet (10 mg total) by mouth daily.  Marland Kitchen FLUoxetine (PROZAC) 20 MG capsule Take 3 capsules (60 mg total) by mouth daily.   No facility-administered encounter medications on file as of 01/20/2021.     Review of Systems  Cardiovascular: Negative for chest pain.  Skin: Negative for rash.  Psychiatric/Behavioral: Negative for hallucinations and suicidal ideas.   Vitals:   01/20/21 1329  BP: 124/80  Temp: 98.7 F (37.1 C)  Weight: 207 lb (93.9 kg)  Height:  5\' 11"  (1.803 m)     Psychiatric Specialty Exam: General Appearance: casual  Eye Contact:: fair  Speech:  Clear and Coherent and Normal Rate  Volume:  Normal  Mood: fair  Affect:  congruent  Thought Process:  Coherent, Linear and Logical  Orientation:  Full (Time, Place, and Person)  Thought Content:  WDL  Suicidal Thoughts:  No  Homicidal Thoughts:  No  Memory:  Immediate;   Good Recent;   Good Remote;   Good  Judgement:  Fair  Insight:  Fair  Psychomotor Activity:  Normal  Concentration:  Good  Recall:  Negative  Akathisia:  Negative  Language-Intact  Fund of Knowledge-Average  Handed:  Right  AIMS (if indicated):   As noted in chart.  Assets:  Communication Skills Desire for Improvement Financial Resources/Insurance Intimacy Leisure  Time Physical Health Resilience Social Support Talents/Skills   Prior documentation reviewed  Assessment: Schizoaffective Disorder  Axis I: Schizoaffective Disorder. GAD. Weight change  Plan:  1. Schizoaffective disorder: doing fair, continue abilify, prozac 2. GAD:  Having a job has helped Discussed sleep hygiene. Not taking trazadone as of now on regular basis    Renewed meds fu 21m in office Seen face to face 6min including documentation  01/20/2021 1:45 PM

## 2021-02-22 ENCOUNTER — Ambulatory Visit: Payer: BC Managed Care – PPO | Admitting: Medical-Surgical

## 2021-02-22 ENCOUNTER — Other Ambulatory Visit: Payer: Self-pay

## 2021-02-22 ENCOUNTER — Encounter: Payer: Self-pay | Admitting: Medical-Surgical

## 2021-02-22 ENCOUNTER — Ambulatory Visit (INDEPENDENT_AMBULATORY_CARE_PROVIDER_SITE_OTHER): Payer: BC Managed Care – PPO

## 2021-02-22 VITALS — BP 111/76 | HR 98 | Temp 99.1°F | Ht 71.0 in | Wt 207.0 lb

## 2021-02-22 DIAGNOSIS — Z23 Encounter for immunization: Secondary | ICD-10-CM | POA: Diagnosis not present

## 2021-02-22 DIAGNOSIS — R0781 Pleurodynia: Secondary | ICD-10-CM

## 2021-02-22 DIAGNOSIS — M25561 Pain in right knee: Secondary | ICD-10-CM

## 2021-02-22 DIAGNOSIS — M546 Pain in thoracic spine: Secondary | ICD-10-CM

## 2021-02-22 DIAGNOSIS — H9190 Unspecified hearing loss, unspecified ear: Secondary | ICD-10-CM

## 2021-02-22 DIAGNOSIS — G8929 Other chronic pain: Secondary | ICD-10-CM

## 2021-02-22 DIAGNOSIS — M25562 Pain in left knee: Secondary | ICD-10-CM

## 2021-02-22 MED ORDER — CELECOXIB 200 MG PO CAPS: 200.0000 mg | ORAL_CAPSULE | Freq: Two times a day (BID) | ORAL | 0 refills | Status: DC
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
## 2021-02-22 NOTE — Patient Instructions (Signed)
Tdap (Tetanus, Diphtheria, Pertussis) Vaccine: What You Need to Know 1. Why get vaccinated? Tdap vaccine can prevent tetanus, diphtheria, and pertussis. Diphtheria and pertussis spread from person to person. Tetanus enters the body through cuts or wounds. TETANUS (T) causes painful stiffening of the muscles. Tetanus can lead to serious health problems, including being unable to open the mouth, having trouble swallowing and breathing, or death. DIPHTHERIA (D) can lead to difficulty breathing, heart failure, paralysis, or death. PERTUSSIS (aP), also known as "whooping cough," can cause uncontrollable, violent coughing that makes it hard to breathe, eat, or drink. Pertussis can be extremely serious especially in babies and young children, causing pneumonia, convulsions, brain damage, or death. In teens and adults, it can cause weight loss, loss of bladder control, passing out, and rib fractures from severe coughing. 2. Tdap vaccine Tdap is only for children 7 years and older, adolescents, and adults.  Adolescents should receive a single dose of Tdap, preferably at age 11 or 12 years. Pregnant people should get a dose of Tdap during every pregnancy, preferably during the early part of the third trimester, to help protect the newborn from pertussis. Infants are most at risk for severe, life-threatening complications frompertussis. Adults who have never received Tdap should get a dose of Tdap. Also, adults should receive a booster dose of either Tdap or Td (a different vaccine that protects against tetanus and diphtheria but not pertussis) every 10 years, or after 5 years in the case of a severe or dirty wound or burn. Tdap may be given at the same time as other vaccines. 3. Talk with your health care provider Tell your vaccine provider if the person getting the vaccine: Has had an allergic reaction after a previous dose of any vaccine that protects against tetanus, diphtheria, or pertussis, or has any  severe, life-threatening allergies Has had a coma, decreased level of consciousness, or prolonged seizures within 7 days after a previous dose of any pertussis vaccine (DTP, DTaP, or Tdap) Has seizures or another nervous system problem Has ever had Guillain-Barr Syndrome (also called "GBS") Has had severe pain or swelling after a previous dose of any vaccine that protects against tetanus or diphtheria In some cases, your health care provider may decide to postpone Tdapvaccination until a future visit. People with minor illnesses, such as a cold, may be vaccinated. People who are moderately or severely ill should usually wait until they recover beforegetting Tdap vaccine.  Your health care provider can give you more information. 4. Risks of a vaccine reaction Pain, redness, or swelling where the shot was given, mild fever, headache, feeling tired, and nausea, vomiting, diarrhea, or stomachache sometimes happen after Tdap vaccination. People sometimes faint after medical procedures, including vaccination. Tellyour provider if you feel dizzy or have vision changes or ringing in the ears.  As with any medicine, there is a very remote chance of a vaccine causing asevere allergic reaction, other serious injury, or death. 5. What if there is a serious problem? An allergic reaction could occur after the vaccinated person leaves the clinic. If you see signs of a severe allergic reaction (hives, swelling of the face and throat, difficulty breathing, a fast heartbeat, dizziness, or weakness), call 9-1-1and get the person to the nearest hospital. For other signs that concern you, call your health care provider.  Adverse reactions should be reported to the Vaccine Adverse Event Reporting System (VAERS). Your health care provider will usually file this report, or you can do it yourself. Visit the   VAERS website at www.vaers.hhs.gov or call 1-800-822-7967. VAERS is only for reporting reactions, and VAERS staff  members do not give medical advice. 6. The National Vaccine Injury Compensation Program The National Vaccine Injury Compensation Program (VICP) is a federal program that was created to compensate people who may have been injured by certain vaccines. Claims regarding alleged injury or death due to vaccination have a time limit for filing, which may be as short as two years. Visit the VICP website at www.hrsa.gov/vaccinecompensation or call 1-800-338-2382to learn about the program and about filing a claim. 7. How can I learn more? Ask your health care provider. Call your local or state health department. Visit the website of the Food and Drug Administration (FDA) for vaccine package inserts and additional information at www.fda.gov/vaccines-blood-biologics/vaccines. Contact the Centers for Disease Control and Prevention (CDC): Call 1-800-232-4636 (1-800-CDC-INFO) or Visit CDC's website at www.cdc.gov/vaccines. Vaccine Information Statement Tdap (Tetanus, Diphtheria, Pertussis) Vaccine(04/16/2020) This information is not intended to replace advice given to you by your health care provider. Make sure you discuss any questions you have with your healthcare provider. Document Revised: 05/12/2020 Document Reviewed: 05/12/2020 Elsevier Patient Education  2022 Elsevier Inc.  

## 2021-03-16 ENCOUNTER — Other Ambulatory Visit: Payer: Self-pay

## 2021-03-16 ENCOUNTER — Encounter: Payer: Self-pay | Admitting: Family Medicine

## 2021-03-16 ENCOUNTER — Ambulatory Visit: Payer: BC Managed Care – PPO | Admitting: Family Medicine

## 2021-03-16 VITALS — BP 132/78 | HR 91 | Ht 71.0 in | Wt 212.0 lb

## 2021-03-16 DIAGNOSIS — G8929 Other chronic pain: Secondary | ICD-10-CM

## 2021-03-16 DIAGNOSIS — Z125 Encounter for screening for malignant neoplasm of prostate: Secondary | ICD-10-CM | POA: Diagnosis not present

## 2021-03-16 DIAGNOSIS — Z1159 Encounter for screening for other viral diseases: Secondary | ICD-10-CM | POA: Diagnosis not present

## 2021-03-16 DIAGNOSIS — K429 Umbilical hernia without obstruction or gangrene: Secondary | ICD-10-CM | POA: Insufficient documentation

## 2021-03-16 DIAGNOSIS — E785 Hyperlipidemia, unspecified: Secondary | ICD-10-CM

## 2021-03-16 DIAGNOSIS — F259 Schizoaffective disorder, unspecified: Secondary | ICD-10-CM

## 2021-03-16 DIAGNOSIS — Z1211 Encounter for screening for malignant neoplasm of colon: Secondary | ICD-10-CM

## 2021-03-16 DIAGNOSIS — M25561 Pain in right knee: Secondary | ICD-10-CM | POA: Insufficient documentation

## 2021-03-16 MED ORDER — CELECOXIB 200 MG PO CAPS: 200.0000 mg | ORAL_CAPSULE | Freq: Two times a day (BID) | ORAL | 3 refills | Status: DC

## 2021-03-16 NOTE — Assessment & Plan Note (Signed)
Will refer back to general surgery for consultation so that hopefully he can have the repair done in December.

## 2021-03-16 NOTE — Assessment & Plan Note (Signed)
Due to recheck lipids. 

## 2021-03-16 NOTE — Assessment & Plan Note (Signed)
Follows with psychiatry for med management.

## 2021-03-16 NOTE — Assessment & Plan Note (Signed)
Meds ordered this encounter  Medications  . celecoxib (CELEBREX) 200 MG capsule    Sig: Take 1 capsule (200 mg total) by mouth 2 (two) times daily. One to 2 tablets by mouth daily as needed for pain.    Dispense:  60 capsule    Refill:  3   Overall  Pain is better.

## 2021-03-16 NOTE — Progress Notes (Signed)
Established Patient Office Visit  Subjective:  Patient ID: Ronald [name], male    DOB: 08-11-1966  Age: 55 y.o. MRN: [id number]  CC:  Chief Complaint  Patient presents with   Follow-up    HPI [name] presents for F/U  F/U umbilical hernia-I saw him for this about 2 years ago and had referred him to general surgery.  He did not go for consultation at that time but has decided like to consider getting it repaired in December he will have some time off of work.he is not having a lot of pain with it but feels like it is larger.    He wants to discuss colonoscopy.  He says he is due for follow-up.  Prior history of colon polyps. Previously seen at [facility] but doesn't want to go back there but says he is due.   Right knee pain - still getting some popping and clicking but overall his pain is better. Would like a refill on his Celebrex.    He recently had some eye problems and was placed on antibiotic eye drops.    Past Medical History:  Diagnosis Date   Cancer ([place])    Skin   Hyperlipidemia 01/20/2015   Schizoaffective disorder ([place]) 07/14/2008   Qualifier: Diagnosis of  By: [name], Militza Devery     Squamous cell skin cancer, ala nasi     Past Surgical History:  Procedure Laterality Date   SKIN BIOPSY      Family History  Problem Relation Age of Onset   Diabetes Mellitus II Brother    Schizophrenia Maternal Uncle    Alcohol abuse Neg Hx    Anxiety disorder Neg Hx    Bipolar disorder Neg Hx    Dementia Neg Hx    Depression Neg Hx    Drug abuse Neg Hx    Coronary artery disease Neg Hx     Social History   Socioeconomic History   Marital status: Single    Spouse name: Not on file   Number of children: Not on file   Years of education: Not on file   Highest education level: Not on file  Occupational History   Not on file  Tobacco Use   Smoking status: Some Days    Packs/day: 1.00    Pack years: 0.00    Types: Cigarettes, Cigars    Last attempt to  quit: 10/30/2008    Years since quitting: 12.3   Smokeless tobacco: Never   Tobacco comments:    Smokes Cigars 1-2 several days a week.   Vaping Use   Vaping Use: Never used  Substance and Sexual Activity   Alcohol use: Yes    Alcohol/week: 3.0 - 4.0 standard drinks    Types: 3 - 4 Cans of beer per week    Comment: non-alchoholic   Drug use: No   Sexual activity: Not Currently  Other Topics Concern   Not on file  Social History Narrative   Not on file   Social Determinants of Health   Financial Resource Strain: Not on file  Food Insecurity: Not on file  Transportation Needs: Not on file  Physical Activity: Not on file  Stress: Not on file  Social Connections: Not on file  Intimate Partner Violence: Not on file    Outpatient Medications Prior to Visit  Medication Sig Dispense Refill   ARIPiprazole (ABILIFY) 10 MG tablet Take 1 tablet (10 mg total) by mouth daily. 90 tablet 0  FLUoxetine (PROZAC) 20 MG capsule Take 3 capsules (60 mg total) by mouth daily. 270 capsule 0   celecoxib (CELEBREX) 200 MG capsule Take 1 capsule (200 mg total) by mouth 2 (two) times daily. One to 2 tablets by mouth daily as needed for pain. 60 capsule 0   traZODone (DESYREL) 50 MG tablet TAKE 1 TABLET BY MOUTH EVERYDAY AT BEDTIME 90 tablet 0   No facility-administered medications prior to visit.    Allergies  Allergen Reactions   Bee Venom Swelling   Penicillins     Other reaction(s): Unknown Pt was told that he had a reaction when he was very young, he doesn't know if he has allergies or not    ROS Review of Systems    Objective:    Physical Exam Constitutional:      Appearance: He is well-developed.  HENT:     Head: Normocephalic and atraumatic.  Cardiovascular:     Rate and Rhythm: Normal rate and regular rhythm.     Heart sounds: Normal heart sounds.  Pulmonary:     Effort: Pulmonary effort is normal.     Breath sounds: Normal breath sounds.  Skin:    General: Skin is warm  and dry.  Neurological:     Mental Status: He is alert and oriented to person, place, and time.  Psychiatric:        Behavior: Behavior normal.    BP 132/78   Pulse 91   Ht 5\' 11"  (1.803 m)   Wt 212 lb (96.2 kg)   SpO2 97%   BMI 29.57 kg/m  Wt Readings from Last 3 Encounters:  03/16/21 212 lb (96.2 kg)  02/22/21 207 lb (93.9 kg)  05/13/19 219 lb (99.3 kg)     Health Maintenance Due  Topic Date Due   Hepatitis C Screening  Never done   Zoster Vaccines- Shingrix (1 of 2) Never done   COLONOSCOPY (Pts 45-66yrs Insurance coverage will need to be confirmed)  Never done   COVID-19 Vaccine (3 - Pfizer risk series) 01/26/2020    There are no preventive care reminders to display for this patient.  Lab Results  Component Value Date   TSH 1.17 02/28/2019   Lab Results  Component Value Date   WBC 6.3 02/28/2019   HGB 15.7 02/28/2019   HCT 45.6 02/28/2019   MCV 86.2 02/28/2019   PLT 204 02/28/2019   Lab Results  Component Value Date   NA 136 02/28/2019   K 4.8 02/28/2019   CO2 27 02/28/2019   GLUCOSE 85 02/28/2019   BUN 12 02/28/2019   CREATININE 1.13 02/28/2019   BILITOT 0.6 02/28/2019   ALKPHOS 56 07/14/2008   AST 22 02/28/2019   ALT 22 02/28/2019   PROT 6.3 02/28/2019   ALBUMIN 4.9 07/14/2008   CALCIUM 9.2 02/28/2019   Lab Results  Component Value Date   CHOL 197 02/28/2019   Lab Results  Component Value Date   HDL 40 02/28/2019   Lab Results  Component Value Date   LDLCALC 128 (H) 02/28/2019   Lab Results  Component Value Date   TRIG 172 (H) 02/28/2019   Lab Results  Component Value Date   CHOLHDL 4.9 02/28/2019   Lab Results  Component Value Date   HGBA1C 5.4 06/21/2018      Assessment & Plan:   Problem List Items Addressed This Visit       Other   Umbilical hernia without obstruction and without gangrene    Will  refer back to general surgery for consultation so that hopefully he can have the repair done in December.        Relevant Orders   Ambulatory referral to General Surgery   Schizoaffective disorder [facility])    Follows with psychiatry for med management.        Right knee pain    Meds ordered this encounter  Medications   celecoxib (CELEBREX) 200 MG capsule    Sig: Take 1 capsule (200 mg total) by mouth 2 (two) times daily. One to 2 tablets by mouth daily as needed for pain.    Dispense:  60 capsule    Refill:  3  Overall  Pain is better.        Relevant Medications   celecoxib (CELEBREX) 200 MG capsule   Hyperlipidemia - Primary    Due to recheck lipids       Relevant Orders   CBC   COMPLETE METABOLIC PANEL WITH GFR   Lipid panel   Other Visit Diagnoses     Screening for prostate cancer       Relevant Orders   PSA   Encounter for hepatitis C screening test for low risk patient       Relevant Orders   Hepatitis C Antibody   Screen for colon cancer       Relevant Orders   Ambulatory referral to Gastroenterology       Meds ordered this encounter  Medications   celecoxib (CELEBREX) 200 MG capsule    Sig: Take 1 capsule (200 mg total) by mouth 2 (two) times daily. One to 2 tablets by mouth daily as needed for pain.    Dispense:  60 capsule    Refill:  3    Follow-up: Return in about 1 year (around 03/16/2022).    Beatrice Lecher, MD

## 2021-03-17 ENCOUNTER — Ambulatory Visit: Payer: BC Managed Care – PPO | Admitting: Audiologist

## 2021-03-22 LAB — COMPLETE METABOLIC PANEL WITH GFR
AG Ratio: 2.1 (calc) (ref 1.0–2.5)
ALT: 19 U/L (ref 9–46)
AST: 19 U/L (ref 10–35)
Albumin: 4.4 g/dL (ref 3.6–5.1)
Alkaline phosphatase (APISO): 83 U/L (ref 35–144)
BUN: 12 mg/dL (ref 7–25)
CO2: 31 mmol/L (ref 20–32)
Calcium: 9.6 mg/dL (ref 8.6–10.3)
Chloride: 100 mmol/L (ref 98–110)
Creat: 1.15 mg/dL (ref 0.70–1.30)
Globulin: 2.1 g/dL (calc) (ref 1.9–3.7)
Glucose, Bld: 88 mg/dL (ref 65–99)
Potassium: 4.7 mmol/L (ref 3.5–5.3)
Sodium: 136 mmol/L (ref 135–146)
Total Bilirubin: 0.5 mg/dL (ref 0.2–1.2)
Total Protein: 6.5 g/dL (ref 6.1–8.1)
eGFR: 75 mL/min/{1.73_m2} (ref >=60)

## 2021-03-22 LAB — LIPID PANEL
Cholesterol: 185 mg/dL (ref <200)
HDL: 46 mg/dL (ref >=40)
LDL Cholesterol (Calc): 115 mg/dL (calc) — ABNORMAL HIGH
Non-HDL Cholesterol (Calc): 139 mg/dL (calc) — ABNORMAL HIGH (ref <130)
Total CHOL/HDL Ratio: 4 (calc) (ref <5.0)
Triglycerides: 129 mg/dL (ref <150)

## 2021-03-22 LAB — CBC
HCT: 50.2 % — ABNORMAL HIGH (ref 38.5–50.0)
Hemoglobin: 17.2 g/dL — ABNORMAL HIGH (ref 13.2–17.1)
MCH: 29.4 pg (ref 27.0–33.0)
MCHC: 34.3 g/dL (ref 32.0–36.0)
MCV: 85.8 fL (ref 80.0–100.0)
MPV: 8.7 fL (ref 7.5–12.5)
Platelets: 211 10*3/uL (ref 140–400)
RBC: 5.85 10*6/uL — ABNORMAL HIGH (ref 4.20–5.80)
RDW: 13.4 % (ref 11.0–15.0)
WBC: 7.1 10*3/uL (ref 3.8–10.8)

## 2021-03-22 LAB — PSA: PSA: 2.55 ng/mL (ref <=4.00)

## 2021-03-22 LAB — HEPATITIS C ANTIBODY
Hepatitis C Ab: NONREACTIVE
SIGNAL TO CUT-OFF: 0.01 (ref <1.00)

## 2021-03-23 ENCOUNTER — Ambulatory Visit: Payer: BC Managed Care – PPO | Admitting: Audiologist

## 2021-03-23 ENCOUNTER — Other Ambulatory Visit: Payer: Self-pay | Admitting: *Deleted

## 2021-03-23 DIAGNOSIS — R899 Unspecified abnormal finding in specimens from other organs, systems and tissues: Secondary | ICD-10-CM

## 2021-03-28 ENCOUNTER — Encounter: Payer: Self-pay | Admitting: Family Medicine

## 2021-03-30 ENCOUNTER — Encounter: Payer: Self-pay | Admitting: Family Medicine

## 2021-03-30 ENCOUNTER — Telehealth (INDEPENDENT_AMBULATORY_CARE_PROVIDER_SITE_OTHER): Payer: BC Managed Care – PPO | Admitting: Family Medicine

## 2021-03-30 ENCOUNTER — Other Ambulatory Visit: Payer: Self-pay

## 2021-03-30 VITALS — Temp 97.7°F

## 2021-03-30 DIAGNOSIS — U071 COVID-19: Secondary | ICD-10-CM

## 2021-03-30 NOTE — Progress Notes (Signed)
Virtual Visit via Video Note  I connected with Ronald [name] on 03/30/21 at  2:00 PM EDT by a video enabled telemedicine application and verified that I am speaking with the correct person using two identifiers.   I discussed the limitations of evaluation and management by telemedicine and the availability of in person appointments. The patient expressed understanding and agreed to proceed.  Patient location: at home Provider location: in office  Subjective:    CC: COVID 19   HPI: Pt tested + on Tuesday. Sxs started 2 days ago on Monday  + mild ST, runny nose. No fever.   His sxs are body aches,sweats,cough. He hasn't been eating much. Drinking lots of water.   Taking IBU 400 mg every 6 hours since yesterday.  He wanted to know if it would be ok to take Celebrex.   Past medical history, Surgical history, Family history not pertinant except as noted below, Social history, Allergies, and medications have been entered into the medical record, reviewed, and corrections made.    Objective:    General: Speaking clearly in complete sentences without any shortness of breath.  Alert and oriented x3.  Normal judgment. No apparent acute distress.    Impression and Recommendations:    No problem-specific Assessment & Plan notes found for this encounter.  COVID-19-did go over current CDC recommendations for quarantine.  Monday would be day 0.  He was quarantine for at least 5 days and then mask and hand wash for the additional 5 if he is improving if not he will need to quarantine for the full 10.  He will need a work note but says he will call us back at the end of the week to see if he is good enough to return on Monday.  Day 5 for him would be Saturday.  We also discussed pros and cons of antiviral treatment he is overall low risk and not having severe symptoms at this point so I think he would be fine to just continue symptomatic care.  At any point he feels worse in the next 48 hours  please give [name] call back and we will prescribe Paxil of it he has normal renal function.   No orders of the defined types were placed in this encounter.   No orders of the defined types were placed in this encounter.    I discussed the assessment and treatment plan with the patient. The patient was provided an opportunity to ask questions and all were answered. The patient agreed with the plan and demonstrated an understanding of the instructions.   The patient was advised to call back or seek an in-person evaluation if the symptoms worsen or if the condition fails to improve as anticipated.   Beatrice Lecher, MD

## 2021-03-30 NOTE — Progress Notes (Signed)
Pt tested + on Tuesday.   His sxs are body aches,sweats,cough. He hasn't been eating much. Drinking lots of water.   Taking IBU 400 mg every 6 hours since yesterday.    He wanted to know if it would be ok to take Celbrex.

## 2021-03-31 ENCOUNTER — Telehealth: Payer: BC Managed Care – PPO | Admitting: Family Medicine

## 2021-04-01 ENCOUNTER — Encounter: Payer: Self-pay | Admitting: Family Medicine

## 2021-04-01 NOTE — Telephone Encounter (Signed)
As long as he is doing well I do not need to see him for that specific issue.  I do not remember if he already had a scheduled appointment or not.  So he may need to clarify that.  If he needs a work note and we can go ahead and send one that says okay to return on Monday.

## 2021-04-04 ENCOUNTER — Encounter: Payer: Self-pay | Admitting: Family Medicine

## 2021-04-05 ENCOUNTER — Other Ambulatory Visit: Payer: Self-pay | Admitting: Psychiatry

## 2021-04-05 ENCOUNTER — Telehealth (HOSPITAL_COMMUNITY): Payer: Self-pay | Admitting: *Deleted

## 2021-04-05 ENCOUNTER — Telehealth: Payer: Self-pay | Admitting: *Deleted

## 2021-04-05 ENCOUNTER — Encounter: Payer: Self-pay | Admitting: Family Medicine

## 2021-04-05 MED ORDER — FLUOXETINE HCL 20 MG PO CAPS: 60.0000 mg | ORAL_CAPSULE | Freq: Every day | ORAL | 0 refills | Status: DC

## 2021-04-05 NOTE — Telephone Encounter (Signed)
Ordered

## 2021-04-05 NOTE — Telephone Encounter (Signed)
Pt lvm asking if he should RTW today. He reports having cough and nasal congestion.   He is supposed to go in at 4 pm. The voicemail was left at 315 pm

## 2021-04-05 NOTE — Telephone Encounter (Signed)
Patient pharmacy [facility] in [place] requesting refills for patient Fluoxetine HCL 20 mg  with quantity of 270

## 2021-04-06 NOTE — Telephone Encounter (Signed)
noted 

## 2021-04-06 NOTE — Telephone Encounter (Signed)
LVM advising pt of recommendations. 

## 2021-04-06 NOTE — Telephone Encounter (Signed)
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

## 2021-04-11 ENCOUNTER — Encounter: Payer: Self-pay | Admitting: Family Medicine

## 2021-05-23 ENCOUNTER — Other Ambulatory Visit: Payer: Self-pay

## 2021-05-23 ENCOUNTER — Ambulatory Visit (INDEPENDENT_AMBULATORY_CARE_PROVIDER_SITE_OTHER): Payer: BC Managed Care – PPO | Admitting: Family Medicine

## 2021-05-23 ENCOUNTER — Encounter: Payer: Self-pay | Admitting: Family Medicine

## 2021-05-23 VITALS — BP 138/72 | HR 95 | Ht 71.0 in | Wt 212.0 lb

## 2021-05-23 DIAGNOSIS — H6122 Impacted cerumen, left ear: Secondary | ICD-10-CM

## 2021-05-23 DIAGNOSIS — Z23 Encounter for immunization: Secondary | ICD-10-CM

## 2021-05-23 DIAGNOSIS — M722 Plantar fascial fibromatosis: Secondary | ICD-10-CM | POA: Diagnosis not present

## 2021-05-23 NOTE — Patient Instructions (Signed)
Recommend Debrox drops for your ear.

## 2021-05-23 NOTE — Progress Notes (Signed)
Established Patient Office Visit  Subjective:  Patient ID: Ronald [name], male    DOB: 02/21/1966  Age: 55 y.o. MRN: [id number]  CC:  Chief Complaint  Patient presents with   Foot Pain    Pt reports that he has been experiencing foot pain x 4 months. It has gotten worse in the past month especially in his R foot    HPI [name] presents for   Complains of right foot pain for 4 months near the arch and base of the heel.  He says it is worse when he first gets out of bed and puts his weight on his foot or if he has been sitting for a little bit and tries to get up at 7 its most painful also by the end of the day he has not tried taking anything for it.  He would like me to look at his left ear again today he has had a cerumen impaction.  He has tried irrigating it but said it did not really get a lot out.  Past Medical History:  Diagnosis Date   Cancer ([place])    Skin   Hyperlipidemia 01/20/2015   Schizoaffective disorder ([name]) 07/14/2008   Qualifier: Diagnosis of  By: [name], Gunnison Chahal     Squamous cell skin cancer, ala nasi     Past Surgical History:  Procedure Laterality Date   SKIN BIOPSY      Family History  Problem Relation Age of Onset   Diabetes Mellitus II Brother    Schizophrenia Maternal Uncle    Alcohol abuse Neg Hx    Anxiety disorder Neg Hx    Bipolar disorder Neg Hx    Dementia Neg Hx    Depression Neg Hx    Drug abuse Neg Hx    Coronary artery disease Neg Hx     Social History   Socioeconomic History   Marital status: Single    Spouse name: Not on file   Number of children: Not on file   Years of education: Not on file   Highest education level: Not on file  Occupational History   Not on file  Tobacco Use   Smoking status: Some Days    Packs/day: 1.00    Types: Cigarettes, Cigars    Last attempt to quit: 10/30/2008    Years since quitting: 12.5   Smokeless tobacco: Never   Tobacco comments:    Smokes Cigars 1-2 several days a  week.   Vaping Use   Vaping Use: Never used  Substance and Sexual Activity   Alcohol use: Yes    Alcohol/week: 3.0 - 4.0 standard drinks    Types: 3 - 4 Cans of beer per week    Comment: non-alchoholic   Drug use: No   Sexual activity: Not Currently  Other Topics Concern   Not on file  Social History Narrative   Not on file   Social Determinants of Health   Financial Resource Strain: Not on file  Food Insecurity: Not on file  Transportation Needs: Not on file  Physical Activity: Not on file  Stress: Not on file  Social Connections: Not on file  Intimate Partner Violence: Not on file    Outpatient Medications Prior to Visit  Medication Sig Dispense Refill   ARIPiprazole (ABILIFY) 10 MG tablet Take 1 tablet (10 mg total) by mouth daily. 90 tablet 0   FLUoxetine (PROZAC) 20 MG capsule Take 3 capsules (60 mg total) by mouth daily. [name]  capsule 0   celecoxib (CELEBREX) 200 MG capsule Take 1 capsule (200 mg total) by mouth 2 (two) times daily. One to 2 tablets by mouth daily as needed for pain. 60 capsule 3   No facility-administered medications prior to visit.    Allergies  Allergen Reactions   Bee Venom Swelling   Penicillins     Other reaction(s): Unknown Pt was told that he had a reaction when he was very young, he doesn't know if he has allergies or not    ROS Review of Systems    Objective:    Physical Exam Vitals reviewed.  Constitutional:      Appearance: He is well-developed.  HENT:     Head: Normocephalic and atraumatic.     Ears:     Comments: Left ear blocked by cerumen.   Eyes:     Conjunctiva/sclera: Conjunctivae normal.  Cardiovascular:     Rate and Rhythm: Normal rate.  Pulmonary:     Effort: Pulmonary effort is normal.  Musculoskeletal:     Comments: Right foot with no rash or erythema.  Skin looks good overall.  He is slightly tender along the anterior edge of the heel.  Otherwise normal range of motion of the toes and ankle.  He does have  some distal and proximal arch collapse.  Skin:    General: Skin is dry.     Coloration: Skin is not pale.  Neurological:     Mental Status: He is alert and oriented to person, place, and time.  Psychiatric:        Behavior: Behavior normal.    BP (!) 148/76   Pulse 95   Ht _0  (1.803 m)   Wt 212 lb 0.6 oz (96.2 kg)   SpO2 99%   BMI 29.57 kg/m  Wt Readings from Last 3 Encounters:  05/23/21 212 lb 0.6 oz (96.2 kg)  03/16/21 212 lb (96.2 kg)  02/22/21 207 lb (93.9 kg)     There are no preventive care reminders to display for this patient.  There are no preventive care reminders to display for this patient.  Lab Results  Component Value Date   TSH 1.17 02/28/2019   Lab Results  Component Value Date   WBC 7.1 03/21/2021   HGB 17.2 (H) 03/21/2021   HCT 50.2 (H) 03/21/2021   MCV 85.8 03/21/2021   PLT 211 03/21/2021   Lab Results  Component Value Date   NA 136 03/21/2021   K 4.7 03/21/2021   CO2 31 03/21/2021   GLUCOSE 88 03/21/2021   BUN 12 03/21/2021   CREATININE 1.15 03/21/2021   BILITOT 0.5 03/21/2021   ALKPHOS 56 07/14/2008   AST 19 03/21/2021   ALT 19 03/21/2021   PROT 6.5 03/21/2021   ALBUMIN 4.9 07/14/2008   CALCIUM 9.6 03/21/2021   EGFR 75 03/21/2021   Lab Results  Component Value Date   CHOL 185 03/21/2021   Lab Results  Component Value Date   HDL 46 03/21/2021   Lab Results  Component Value Date   LDLCALC 115 (H) 03/21/2021   Lab Results  Component Value Date   TRIG 129 03/21/2021   Lab Results  Component Value Date   CHOLHDL 4.0 03/21/2021   Lab Results  Component Value Date   HGBA1C 5.4 06/21/2018      Assessment & Plan:   Problem List Items Addressed This Visit   None Visit Diagnoses     Need for immunization against influenza    -  Primary   Relevant Orders   Flu Vaccine QUAD 66moIM (Fluarix, Fluzone & Alfiuria Quad PF) (Completed)   Plantar fasciitis       Left ear impacted cerumen         Plantar fasciitis,  right foot-discussed diagnosis and discussed treatment options.  Recommend using a tennis ball to loosen up the tissue first thing in the morning, work on the stretches, and use icing in the evening.  Also okay to take an anti-inflammatory recommend 60 mg ibuprofen in the late afternoon to help him get through the evenings.  If not improving over the next 3 to 4 weeks then please let [place] and we will get him in with Dr. TDianah Fieldfor further treatment and evaluation options  Left ear impacted cerumen-recommend adding Debrox to his regimen.  Recommend use for 4 days on and 3 days off for 2 weeks and then try to irrigate again.  No orders of the defined types were placed in this encounter.   Follow-up: No follow-ups on file.    CBeatrice Lecher MD

## 2021-05-31 ENCOUNTER — Encounter (HOSPITAL_COMMUNITY): Payer: Self-pay | Admitting: Psychiatry

## 2021-05-31 ENCOUNTER — Ambulatory Visit (INDEPENDENT_AMBULATORY_CARE_PROVIDER_SITE_OTHER): Payer: BC Managed Care – PPO | Admitting: Psychiatry

## 2021-05-31 VITALS — BP 100/62 | Temp 97.7°F | Ht 71.0 in | Wt 208.0 lb

## 2021-05-31 DIAGNOSIS — F411 Generalized anxiety disorder: Secondary | ICD-10-CM

## 2021-05-31 DIAGNOSIS — F259 Schizoaffective disorder, unspecified: Secondary | ICD-10-CM | POA: Diagnosis not present

## 2021-05-31 MED ORDER — ARIPIPRAZOLE 10 MG PO TABS: 10.0000 mg | ORAL_TABLET | Freq: Every day | ORAL | 0 refills | Status: DC

## 2021-05-31 NOTE — Progress Notes (Signed)
Patient ID: Ronald [name], male   DOB: 22-Apr-1966, 55 y.o.   MRN: [id number]   [facility] Follow-up Outpatient Visit  Ronald [name] 06-21-1966 [id number] 55 y.o. 05/31/2021 1:46 PM    Chief Complaint: follow up . Med review  HPI Comments:  Brief history as per previous notes" Patient started having paranoid delusions in 2009"  saphris has helped in past.   Patient continues to do well on Abilify no tremors noticeable.  He is working in Nurse, learning disability work part-time   Doesn't think people are after him  Modifying factor: Medications, job  Duration adult life Past Medical Family, Social History:  Past Medical History:  Diagnosis Date   Cancer ([place])    Skin   Hyperlipidemia 01/20/2015   Schizoaffective disorder ([place]) 07/14/2008   Qualifier: Diagnosis of  By: [name], Catherine     Squamous cell skin cancer, ala nasi    Family History  Problem Relation Age of Onset   Diabetes Mellitus II Brother    Schizophrenia Maternal Uncle    Alcohol abuse Neg Hx    Anxiety disorder Neg Hx    Bipolar disorder Neg Hx    Dementia Neg Hx    Depression Neg Hx    Drug abuse Neg Hx    Coronary artery disease Neg Hx    Social History   Socioeconomic History   Marital status: Single    Spouse name: Not on file   Number of children: Not on file   Years of education: Not on file   Highest education level: Not on file  Occupational History   Not on file  Tobacco Use   Smoking status: Some Days    Packs/day: 1.00    Types: Cigarettes, Cigars    Last attempt to quit: 10/30/2008    Years since quitting: 12.5   Smokeless tobacco: Never   Tobacco comments:    Smokes Cigars 1-2 several days a week.   Vaping Use   Vaping Use: Never used  Substance and Sexual Activity   Alcohol use: Yes    Alcohol/week: 3.0 - 4.0 standard drinks    Types: 3 - 4 Cans of beer per week    Comment: non-alchoholic   Drug use: No   Sexual activity: Not Currently  Other Topics  Concern   Not on file  Social History Narrative   Not on file   Social Determinants of Health   Financial Resource Strain: Not on file  Food Insecurity: Not on file  Transportation Needs: Not on file  Physical Activity: Not on file  Stress: Not on file  Social Connections: Not on file    Outpatient Encounter Medications as of 05/31/2021  Medication Sig   ARIPiprazole (ABILIFY) 10 MG tablet Take 1 tablet (10 mg total) by mouth daily.   FLUoxetine (PROZAC) 20 MG capsule Take 3 capsules (60 mg total) by mouth daily.   [DISCONTINUED] ARIPiprazole (ABILIFY) 10 MG tablet Take 1 tablet (10 mg total) by mouth daily.   No facility-administered encounter medications on file as of 05/31/2021.     Review of Systems  Cardiovascular:  Negative for chest pain.  Skin:  Negative for rash.  Psychiatric/Behavioral:  Negative for hallucinations and suicidal ideas.   Vitals:   05/31/21 1330  BP: 100/62  Temp: 97.7 F (36.5 C)  Weight: 208 lb (94.3 kg)  Height: 5\' 11"  (1.803 m)     Psychiatric Specialty Exam: General Appearance: casual  Eye Contact:: fair  Speech:  Clear and Coherent and Normal Rate  Volume:  Normal  Mood: fair  Affect:  congruent  Thought Process:  Coherent, Linear and Logical  Orientation:  Full (Time, Place, and Person)  Thought Content:  WDL  Suicidal Thoughts:  No  Homicidal Thoughts:  No  Memory:  Immediate;   Good Recent;   Good Remote;   Good  Judgement:  Fair  Insight:  Fair  Psychomotor Activity:  Normal  Concentration:  Good  Recall:  Negative  Akathisia:  Negative  Language-Intact  Fund of Knowledge-Average  Handed:  Right  AIMS (if indicated):   As noted in chart.  Assets:  Communication Skills Desire for Improvement Financial Resources/Insurance Intimacy Leisure Time Physical Health Resilience Social Support Talents/Skills   Prior documentation reviewed  Assessment: Schizoaffective Disorder  Axis I: Schizoaffective Disorder. GAD.  Weight change  Plan: Prior documentation reviewed 1. Schizoaffective disorder: Remains stable continue Abilify and Prozac  2. GAD: Manageable keeping self busy has helped and Prozac will continue Call for refills Abilify will be sent  Total time spent in office 15 minutes face-to-face  05/31/2021 1:46 PM

## 2021-07-19 ENCOUNTER — Encounter: Payer: Self-pay | Admitting: Physician Assistant

## 2021-07-19 ENCOUNTER — Ambulatory Visit: Payer: BC Managed Care – PPO | Admitting: Physician Assistant

## 2021-07-19 ENCOUNTER — Other Ambulatory Visit: Payer: Self-pay

## 2021-07-19 VITALS — BP 136/86 | HR 94 | Ht 71.0 in | Wt 211.0 lb

## 2021-07-19 DIAGNOSIS — H6123 Impacted cerumen, bilateral: Secondary | ICD-10-CM | POA: Insufficient documentation

## 2021-07-19 DIAGNOSIS — H6993 Unspecified Eustachian tube disorder, bilateral: Secondary | ICD-10-CM | POA: Insufficient documentation

## 2021-07-19 DIAGNOSIS — H6983 Other specified disorders of Eustachian tube, bilateral: Secondary | ICD-10-CM

## 2021-07-19 MED ORDER — FLUTICASONE PROPIONATE 50 MCG/ACT NA SUSP: 2.0000 | Freq: Every day | NASAL | 0 refills | Status: DC

## 2021-07-19 NOTE — Progress Notes (Signed)
L 

## 2021-07-19 NOTE — Patient Instructions (Addendum)
Flonase 2 sprays each nostril.  Sudafed for a few days   Earwax Buildup, Adult The ears produce a substance called earwax that helps keep bacteria out of the ear and protects the skin in the ear canal. Occasionally, earwax can build up in the ear and cause discomfort or hearing loss. What are the causes? This condition is caused by a buildup of earwax. Ear canals are self-cleaning. Ear wax is made in the outer part of the ear canal and generally falls out in small amounts over time. When the self-cleaning mechanism is not working, earwax builds up and can cause decreased hearing and discomfort. Attempting to clean ears with cotton swabs can push the earwax deep into the ear canal and cause decreased hearing and pain. What increases the risk? This condition is more likely to develop in people who: Clean their ears often with cotton swabs. Pick at their ears. Use earplugs or in-ear headphones often, or wear hearing aids. The following factors may also make you more likely to develop this condition: Being male. Being of older age. Naturally producing more earwax. Having narrow ear canals. Having earwax that is overly thick or sticky. Having excess hair in the ear canal. Having eczema. Being dehydrated. What are the signs or symptoms? Symptoms of this condition include: Reduced or muffled hearing. A feeling of fullness in the ear or feeling that the ear is plugged. Fluid coming from the ear. Ear pain or an itchy ear. Ringing in the ear. Coughing. Balance problems. An obvious piece of earwax that can be seen inside the ear canal. How is this diagnosed? This condition may be diagnosed based on: Your symptoms. Your medical history. An ear exam. During the exam, your health care provider will look into your ear with an instrument called an otoscope. You may have tests, including a hearing test. How is this treated? This condition may be treated by: Using ear drops to soften the  earwax. Having the earwax removed by a health care provider. The health care provider may: Flush the ear with water. Use an instrument that has a loop on the end (curette). Use a suction device. Having surgery to remove the wax buildup. This may be done in severe cases. Follow these instructions at home:  Take over-the-counter and prescription medicines only as told by your health care provider. Do not put any objects, including cotton swabs, into your ear. You can clean the opening of your ear canal with a washcloth or facial tissue. Follow instructions from your health care provider about cleaning your ears. Do not overclean your ears. Drink enough fluid to keep your urine pale yellow. This will help to thin the earwax. Keep all follow-up visits as told. If earwax builds up in your ears often or if you use hearing aids, consider seeing your health care provider for routine, preventive ear cleanings. Ask your health care provider how often you should schedule your cleanings. If you have hearing aids, clean them according to instructions from the manufacturer and your health care provider. Contact a health care provider if: You have ear pain. You develop a fever. You have pus or other fluid coming from your ear. You have hearing loss. You have ringing in your ears that does not go away. You feel like the room is spinning (vertigo). Your symptoms do not improve with treatment. Get help right away if: You have bleeding from the affected ear. You have severe ear pain. Summary Earwax can build up in the ear and  cause discomfort or hearing loss. The most common symptoms of this condition include reduced or muffled hearing, a feeling of fullness in the ear, or feeling that the ear is plugged. This condition may be diagnosed based on your symptoms, your medical history, and an ear exam. This condition may be treated by using ear drops to soften the earwax or by having the earwax removed by a  health care provider. Do not put any objects, including cotton swabs, into your ear. You can clean the opening of your ear canal with a washcloth or facial tissue. This information is not intended to replace advice given to you by your health care provider. Make sure you discuss any questions you have with your health care provider. Document Revised: 12/16/2019 Document Reviewed: 12/16/2019 Elsevier Patient Education  [place].

## 2021-07-19 NOTE — Progress Notes (Signed)
Subjective:    Patient ID: Ronald [name], male    DOB: 1966-02-06, 55 y.o.   MRN: [id number]  HPI Pt is a 55 yo male with bilateral ear hearing loss and ear pressure for last 2 weeks with left worse than right. He has sneezed a few times but denies any fever, chills, body aches, sinus pressure, ST, cough. Tried some debrox to clean out ears and no benefit. Ears feel very full and cannot hear and wanted them looked at.   .. Active Ambulatory Problems    Diagnosis Date Noted   Schizoaffective disorder ([place]) 07/14/2008   BACK PAIN, THORACIC REGION 12/22/2008   FATIGUE 12/22/2008   NIGHT SWEATS 10/27/2008   LACERATION 07/14/2010   History of basal cell cancer 01/16/2014   Hyperlipidemia 01/20/2015   Peroneal tendinitis of left lower extremity 04/10/2018   GERD (gastroesophageal reflux disease) 06/13/2018   MDD (major depressive disorder) 60/73/7106   Umbilical hernia without obstruction and without gangrene 03/16/2021   Right knee pain 03/16/2021   Bilateral hearing loss due to cerumen impaction 07/19/2021   Dysfunction of both eustachian tubes 07/19/2021   Resolved Ambulatory Problems    Diagnosis Date Noted   No Resolved Ambulatory Problems   Past Medical History:  Diagnosis Date   Cancer ([place])    Squamous cell skin cancer, ala nasi       Review of Systems See HPI.     Objective:   Physical Exam Vitals reviewed.  Constitutional:      Appearance: Normal appearance. He is obese.  HENT:     Head: Normocephalic.     Right Ear: There is impacted cerumen.     Left Ear: There is impacted cerumen.     Ears:     Comments: TM's after irrigation showed air bubbles and effusion at the base of both ears.     Nose: Nose normal.     Mouth/Throat:     Mouth: Mucous membranes are moist.     Pharynx: No oropharyngeal exudate or posterior oropharyngeal erythema.  Eyes:     Extraocular Movements: Extraocular movements intact.     Conjunctiva/sclera: Conjunctivae normal.      Pupils: Pupils are equal, round, and reactive to light.  Cardiovascular:     Rate and Rhythm: Normal rate and regular rhythm.  Pulmonary:     Effort: Pulmonary effort is normal.     Breath sounds: Normal breath sounds.  Lymphadenopathy:     Cervical: No cervical adenopathy.  Neurological:     General: No focal deficit present.     Mental Status: He is alert and oriented to person, place, and time.  Psychiatric:        Mood and Affect: Mood normal.   ..Cerumen Removal Template: Indication: Cerumen impaction of the ear(s) Medical necessity statement: On physical examination, cerumen impairs clinically significant portions of the external auditory canal, and tympanic membrane. Noted obstructive, copious cerumen that cannot be removed without magnification and instrumentations requiring physician skills Consent: Discussed benefits and risks of procedure and verbal consent obtained Procedure: Patient was prepped for the procedure. Utilized an otoscope to assess and take note of the ear canal, the tympanic membrane, and the presence, amount, and placement of the cerumen. Gentle water irrigation and soft plastic curette was utilized to remove cerumen.  Post procedure examination shows cerumen was completely removed. Patient tolerated procedure well. The patient is made aware that they may experience temporary vertigo, temporary hearing loss, and temporary discomfort. If these symptom  last for more than 24 hours to call the clinic or proceed to the ED.         Assessment & Plan:   Marland KitchenMarland KitchenJayvien was seen today for ear problem.  Diagnoses and all orders for this visit:  Bilateral hearing loss due to cerumen impaction  Dysfunction of both eustachian tubes -     fluticasone (FLONASE) 50 MCG/ACT nasal spray; Place 2 sprays into both nostrils daily.  Irrigated ears today. TM appeared to have some fluid behind and still reported some pressure. No signs of infection. Started flonase for next week and  sudafed if needed.  HO given.  Follow up as needed or if symptoms persist.

## 2021-08-09 ENCOUNTER — Ambulatory Visit (HOSPITAL_BASED_OUTPATIENT_CLINIC_OR_DEPARTMENT_OTHER): Admission: RE | Admit: 2021-08-09 | Payer: BC Managed Care – PPO | Source: Home / Self Care | Admitting: Surgery

## 2021-08-09 ENCOUNTER — Encounter (HOSPITAL_BASED_OUTPATIENT_CLINIC_OR_DEPARTMENT_OTHER): Admission: RE | Payer: Self-pay | Source: Home / Self Care

## 2021-08-09 SURGERY — REPAIR, HERNIA, UMBILICAL, ADULT
Anesthesia: General

## 2021-08-19 ENCOUNTER — Emergency Department
Admission: EM | Admit: 2021-08-19 | Discharge: 2021-08-19 | Disposition: A | Discharge status: Home / Self Care | Payer: BC Managed Care – PPO | Source: Home / Self Care

## 2021-08-19 ENCOUNTER — Encounter: Payer: Self-pay | Admitting: Emergency Medicine

## 2021-08-19 ENCOUNTER — Telehealth: Payer: Self-pay | Admitting: *Deleted

## 2021-08-19 ENCOUNTER — Other Ambulatory Visit: Payer: Self-pay

## 2021-08-19 DIAGNOSIS — S161XXA Strain of muscle, fascia and tendon at neck level, initial encounter: Secondary | ICD-10-CM

## 2021-08-19 NOTE — ED Triage Notes (Signed)
Neck pain since noon  Pt states he was at the eye doctor sitting in the exam chair when the head rest was removed  Per pt his "head snapped back and he could not stop it and it bounced back back on it's own"  Pt concerned about a neck injury  Pt was at the eye doctor for an injection  No OTC meds  Denies numbness or tingling
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
## 2021-08-19 NOTE — Discharge Instructions (Signed)
See your Physician for recheck next week if any symptoms persist

## 2021-08-25 ENCOUNTER — Ambulatory Visit (INDEPENDENT_AMBULATORY_CARE_PROVIDER_SITE_OTHER): Payer: BC Managed Care – PPO | Admitting: Psychiatry

## 2021-08-25 ENCOUNTER — Encounter (HOSPITAL_COMMUNITY): Payer: Self-pay | Admitting: Psychiatry

## 2021-08-25 ENCOUNTER — Other Ambulatory Visit: Payer: Self-pay

## 2021-08-25 VITALS — BP 168/104 | Temp 98.0°F | Ht 71.0 in | Wt 214.0 lb

## 2021-08-25 DIAGNOSIS — F259 Schizoaffective disorder, unspecified: Secondary | ICD-10-CM | POA: Diagnosis not present

## 2021-08-25 DIAGNOSIS — Z639 Problem related to primary support group, unspecified: Secondary | ICD-10-CM

## 2021-08-25 DIAGNOSIS — F411 Generalized anxiety disorder: Secondary | ICD-10-CM

## 2021-08-25 MED ORDER — ARIPIPRAZOLE 5 MG PO TABS: 5.0000 mg | ORAL_TABLET | Freq: Every day | ORAL | 0 refills | Status: DC

## 2021-08-25 MED ORDER — FLUOXETINE HCL 20 MG PO CAPS: 40.0000 mg | ORAL_CAPSULE | Freq: Every day | ORAL | 0 refills | Status: DC

## 2021-08-25 NOTE — Progress Notes (Signed)
Patient ID: Ronald [name], male   DOB: 1966-02-15, 55 y.o.   MRN: [id number]   [place] [facility] Follow-up Outpatient Visit  Ronald [name] June 10, 1966 [id number] 55 y.o. 08/25/2021 55:24 PM    Chief Complaint: follow up . Med review  HPI Comments:  Brief history as per previous notes" Patient started having paranoid delusions in 2009"  saphris has helped in past.   Was doing fair but skipped med 8 weeks ago and then forgot few days and never started, now feeling somewhat in edge, subdued Stress related to sister she will be here during [place] as housekeeping is going well    Doesn't think people are after him  Modifying factor: job Aggravating factor: sister, mom at times Duration adult life   Duration adult life Past Medical Family, Social History:  Past Medical History:  Diagnosis Date   Cancer ([place])    Skin   Hyperlipidemia 01/20/2015   Schizoaffective disorder ([place]) 07/14/2008   Qualifier: Diagnosis of  By: [name], Catherine     Squamous cell skin cancer, ala nasi    Family History  Problem Relation Age of Onset   Diabetes Mellitus II Brother    Schizophrenia Maternal Uncle    Alcohol abuse Neg Hx    Anxiety disorder Neg Hx    Bipolar disorder Neg Hx    Dementia Neg Hx    Depression Neg Hx    Drug abuse Neg Hx    Coronary artery disease Neg Hx    Social History   Socioeconomic History   Marital status: Single    Spouse name: Not on file   Number of children: Not on file   Years of education: Not on file   Highest education level: Not on file  Occupational History   Not on file  Tobacco Use   Smoking status: Some Days    Packs/day: 1.00    Types: Cigarettes, Cigars    Last attempt to quit: 10/30/2008    Years since quitting: 12.8   Smokeless tobacco: Never   Tobacco comments:    Smokes Cigars 1-2 several days a week.   Vaping Use   Vaping Use: Never used  Substance and Sexual Activity   Alcohol use: Yes    Alcohol/week:  3.0 - 4.0 standard drinks    Types: 3 - 4 Cans of beer per week    Comment: non-alchoholic   Drug use: No   Sexual activity: Not Currently  Other Topics Concern   Not on file  Social History Narrative   Not on file   Social Determinants of Health   Financial Resource Strain: Not on file  Food Insecurity: Not on file  Transportation Needs: Not on file  Physical Activity: Not on file  Stress: Not on file  Social Connections: Not on file    Outpatient Encounter Medications as of 08/25/2021  Medication Sig   ARIPiprazole (ABILIFY) 5 MG tablet Take 1 tablet (5 mg total) by mouth daily.   FLUoxetine (PROZAC) 20 MG capsule Take 2 capsules (40 mg total) by mouth daily.   fluticasone (FLONASE) 50 MCG/ACT nasal spray Place 2 sprays into both nostrils daily.   [DISCONTINUED] ARIPiprazole (ABILIFY) 10 MG tablet Take 1 tablet (10 mg total) by mouth daily.   [DISCONTINUED] FLUoxetine (PROZAC) 20 MG capsule Take 3 capsules (60 mg total) by mouth daily.   No facility-administered encounter medications on file as of 08/25/2021.     Review of Systems  Cardiovascular:  Negative  for chest pain.  Skin:  Negative for rash.  Neurological:  Negative for tremors.  Psychiatric/Behavioral:  Positive for depression. Negative for hallucinations and suicidal ideas.   Vitals:   08/25/21 1301  BP: (!) 168/104  Temp: 98 F (36.7 C)  Weight: 214 lb (97.1 kg)  Height: 5\' 11"  (1.803 m)     Psychiatric Specialty Exam: General Appearance: casual  Eye Contact:: fair  Speech:  Clear and Coherent and Normal Rate  Volume:  Normal  Mood: subdued  Affect:  congruent  Thought Process:  Coherent, Linear and Logical  Orientation:  Full (Time, Place, and Person)  Thought Content:  WDL  Suicidal Thoughts:  No  Homicidal Thoughts:  No  Memory:  Immediate;   Good Recent;   Good Remote;   Good  Judgement:  Fair  Insight:  Fair  Psychomotor Activity:  Normal  Concentration:  Good  Recall:  Negative   Akathisia:  Negative  Language-Intact  Fund of Knowledge-Average  Handed:  Right  AIMS (if indicated):   As noted in chart.  Assets:  Communication Skills Desire for Improvement Financial Resources/Insurance Intimacy Leisure Time Physical Health Resilience Social Support Talents/Skills   Prior documentation reviewed  Assessment: Schizoaffective Disorder  Axis I: Schizoaffective Disorder. GAD. Weight change  Plan: Prior documentation reviewed   1. Schizoaffective disorder: non compliant, feeling subdued, restart meds prozac was 60mg  can start 20 and increase to 40mg  in one week  2. GAD: anxious, stressare due to holidays and sister, restart prozac as above Discussed compliance  3. Relationship: sister can be challenging discussed to keep awayy from conflicts , start prozac as above  Fu 63m No side efects when take meds Time spent face to face 25 minutes including chart review, med adjustment and documentation  08/25/2021 1:24 PM

## 2021-10-27 ENCOUNTER — Ambulatory Visit (HOSPITAL_COMMUNITY): Payer: BC Managed Care – PPO | Admitting: Psychiatry

## 2021-10-28 IMAGING — DX DG RIBS W/ CHEST 3+V*R*
3 series · 3 of 3 positions shown · non-contrast
Comparison: None.

CLINICAL DATA: Right posterior lower rib pain for the past month.
No injury.

EXAM:
RIGHT RIBS AND CHEST - 3+ VIEW

[chest pa]
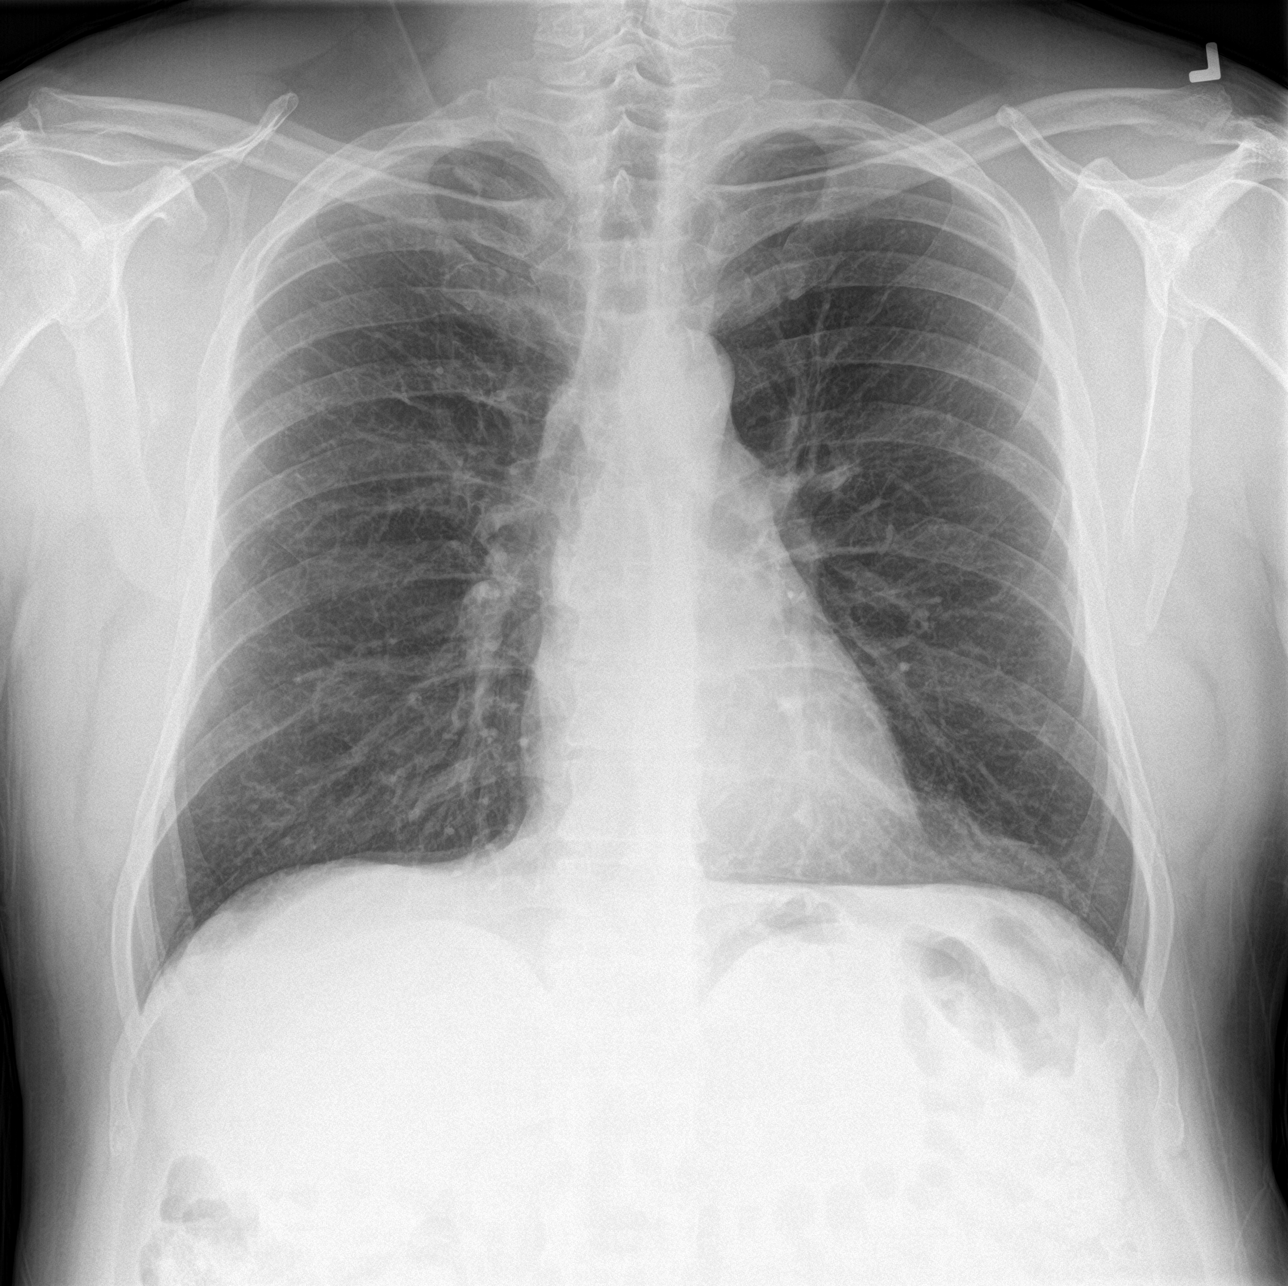

[rib ap]
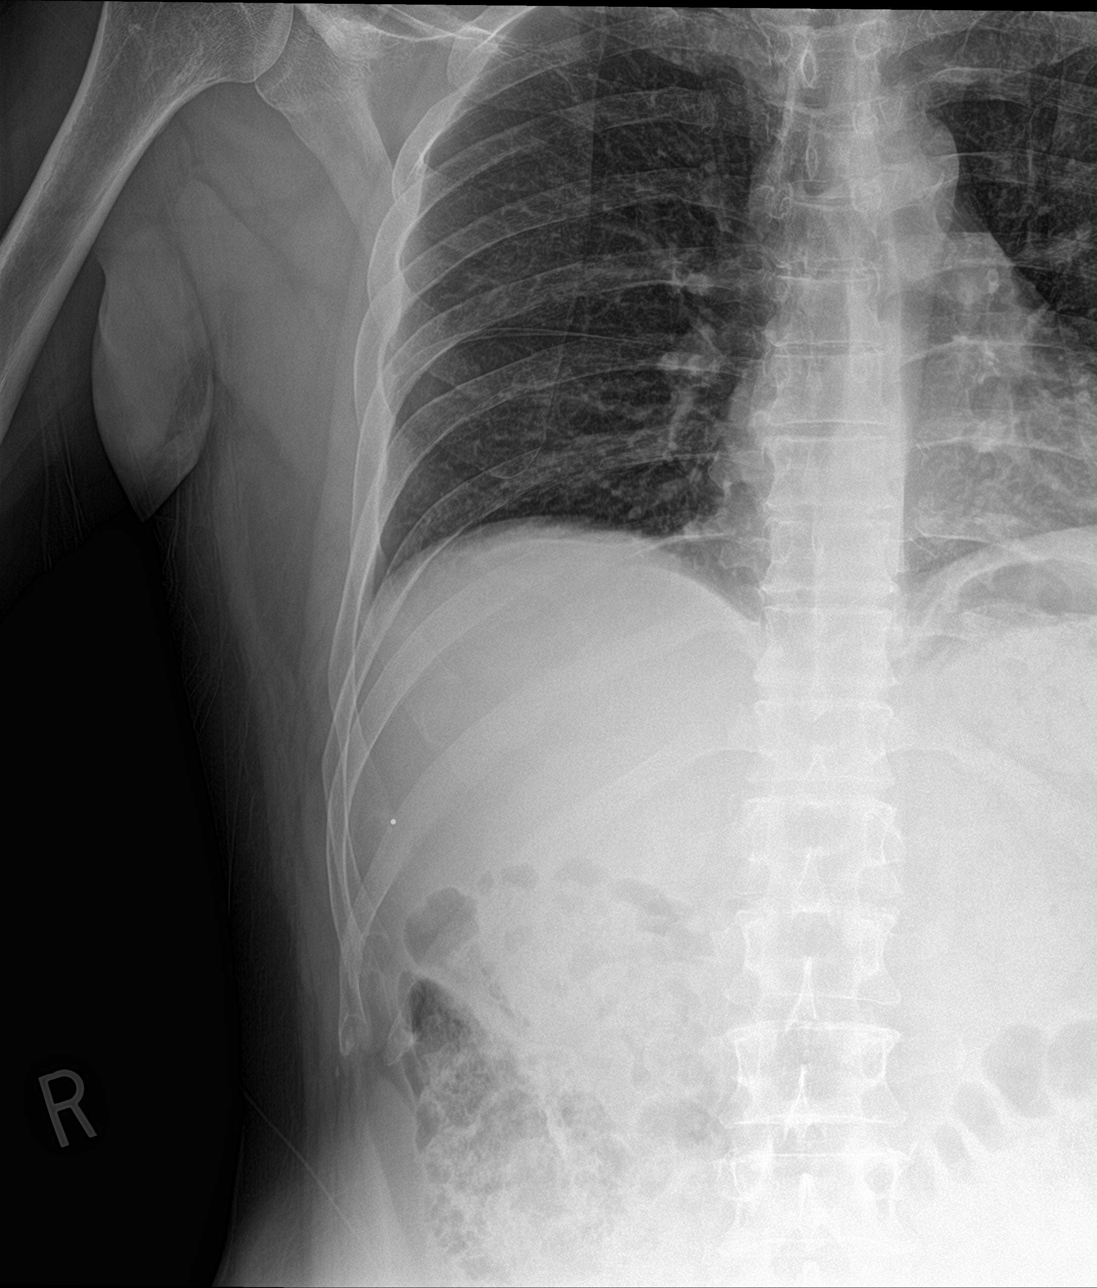

[rib ap obl]
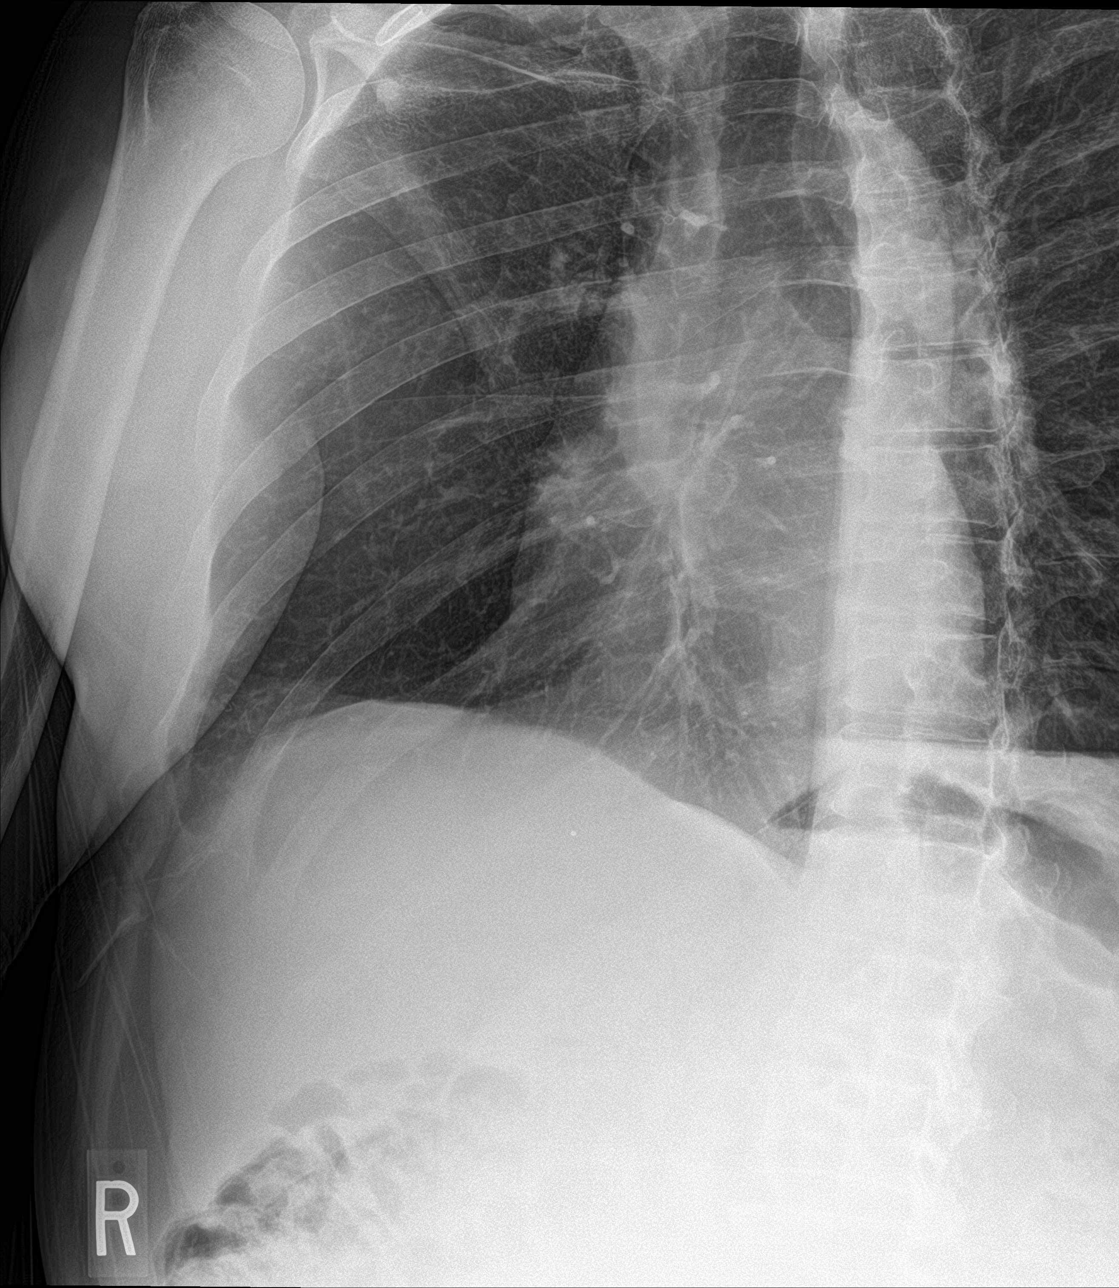

[3 of 3 positions shown; findings below may reference images not displayed]

FINDINGS: No fracture or other bone lesions are seen involving the ribs. There
is no evidence of pneumothorax or pleural effusion. Both lungs are
clear. Heart size and mediastinal contours are within normal limits.
IMPRESSION: Negative.

## 2021-10-31 ENCOUNTER — Telehealth (HOSPITAL_COMMUNITY): Payer: BC Managed Care – PPO | Admitting: Psychiatry

## 2022-03-07 ENCOUNTER — Emergency Department
Admission: EM | Admit: 2022-03-07 | Discharge: 2022-03-07 | Disposition: A | Discharge status: Home / Self Care | Payer: BC Managed Care – PPO | Source: Home / Self Care

## 2022-03-07 DIAGNOSIS — T63441A Toxic effect of venom of bees, accidental (unintentional), initial encounter: Secondary | ICD-10-CM | POA: Diagnosis not present

## 2022-03-07 DIAGNOSIS — M7989 Other specified soft tissue disorders: Secondary | ICD-10-CM

## 2022-03-07 MED ORDER — METHYLPREDNISOLONE SODIUM SUCC 125 MG IJ SOLR
125.0000 mg | Freq: Once | INTRAMUSCULAR | Status: AC
  Administered 2022-03-07: 125 mg via INTRAMUSCULAR

## 2022-03-07 MED ORDER — PREDNISONE 20 MG PO TABS: ORAL_TABLET | ORAL | 0 refills | Status: DC

## 2022-03-07 NOTE — ED Triage Notes (Addendum)
Pt c/o bee sting to RT hand since yesterday about noon. Benedryl 50mg  at 4am today. Very itchy and swollen.

## 2022-11-30 ENCOUNTER — Encounter: Payer: Self-pay | Admitting: Family Medicine

## 2022-11-30 ENCOUNTER — Ambulatory Visit (INDEPENDENT_AMBULATORY_CARE_PROVIDER_SITE_OTHER): Payer: BC Managed Care – PPO | Admitting: Family Medicine

## 2022-11-30 VITALS — BP 127/83 | HR 103 | Ht 71.0 in | Wt 213.0 lb

## 2022-11-30 DIAGNOSIS — Z23 Encounter for immunization: Secondary | ICD-10-CM | POA: Diagnosis not present

## 2022-11-30 DIAGNOSIS — Z0000 Encounter for general adult medical examination without abnormal findings: Secondary | ICD-10-CM | POA: Diagnosis not present

## 2022-11-30 DIAGNOSIS — Z1211 Encounter for screening for malignant neoplasm of colon: Secondary | ICD-10-CM

## 2022-11-30 NOTE — Progress Notes (Signed)
Complete physical exam  Patient: Ronald [name]   DOB: Nov 05, 1965   57 y.o. Male  MRN: [id number]  Subjective:    Chief Complaint  Patient presents with   Annual Exam    Ronald [name] is a 57 y.o. male who presents today for a complete physical exam. He reports consuming a general diet. The patient does not participate in regular exercise at present. He generally feels fairly well.  He does not have additional problems to discuss today. Small red spot maybe a centimeter in size on the top of the left foot that is itchy.  He has been putting some over-the-counter ointment on it.   Most recent fall risk assessment:    11/30/2022    8:21 AM  Fall Risk   Falls in the past year? 1  Number falls in past yr: 0  Injury with Fall? 1  Risk for fall due to : No Fall Risks  Follow up Falls evaluation completed     Most recent depression screenings:    11/30/2022    8:42 AM 10/24/2018    5:06 PM  PHQ 2/9 Scores  PHQ - 2 Score 0 0  PHQ- 9 Score 3 6        Patient Care Team: Hali Marry, MD as PCP - General Jodi Mourning, Amy V, OD as Referring Physician (Optometry)   Outpatient Medications Prior to Visit  Medication Sig   [DISCONTINUED] ARIPiprazole (ABILIFY) 5 MG tablet Take 1 tablet (5 mg total) by mouth daily.   [DISCONTINUED] FLUoxetine (PROZAC) 20 MG capsule Take 2 capsules (40 mg total) by mouth daily.   [DISCONTINUED] fluticasone (FLONASE) 50 MCG/ACT nasal spray Place 2 sprays into both nostrils daily.   [DISCONTINUED] predniSONE (DELTASONE) 20 MG tablet Take 3 tabs PO daily x 5 days.   No facility-administered medications prior to visit.    ROS        Objective:     BP 127/83   Pulse (!) 103   Ht 5\' 11"  (1.803 m)   Wt 213 lb (96.6 kg)   SpO2 99%   BMI 29.71 kg/m    Physical Exam Constitutional:      Appearance: He is well-developed.  HENT:     Head: Normocephalic and atraumatic.     Right Ear: Tympanic membrane, ear canal and external ear  normal.     Left Ear: Tympanic membrane, ear canal and external ear normal.     Nose: Nose normal.     Mouth/Throat:     Pharynx: Oropharynx is clear.  Eyes:     Conjunctiva/sclera: Conjunctivae normal.     Pupils: Pupils are equal, round, and reactive to light.  Neck:     Thyroid: No thyromegaly.  Cardiovascular:     Rate and Rhythm: Normal rate and regular rhythm.     Heart sounds: Normal heart sounds.  Pulmonary:     Effort: Pulmonary effort is normal.     Breath sounds: Normal breath sounds.  Abdominal:     General: Bowel sounds are normal. There is no distension.     Palpations: Abdomen is soft. There is no mass.     Tenderness: There is no abdominal tenderness. There is no guarding or rebound.  Musculoskeletal:        General: Normal range of motion.     Cervical back: Normal range of motion and neck supple. No tenderness.  Lymphadenopathy:     Cervical: No cervical adenopathy.  Skin:  General: Skin is warm and dry.  Neurological:     Mental Status: He is alert and oriented to person, place, and time.     Deep Tendon Reflexes: Reflexes are normal and symmetric.  Psychiatric:        Behavior: Behavior normal.        Thought Content: Thought content normal.        Judgment: Judgment normal.      No results found for any visits on 11/30/22.     Assessment & Plan:    Routine Health Maintenance and Physical Exam  Immunization History  Administered Date(s) Administered   Influenza Split 09/13/2014   Influenza,inj,Quad PF,6+ Mos 10/04/2017, 05/07/2018, 05/06/2019, 05/23/2021   Influenza-Unspecified 05/06/2019   PFIZER(Purple Top)SARS-COV-2 Vaccination 12/04/2019, 12/29/2019   Td 07/14/2010   Tdap 02/22/2021   Zoster Recombinat (Shingrix) 11/30/2022    Health Maintenance  Topic Date Due   INFLUENZA VACCINE  12/10/2022 (Originally 04/11/2022)   COLONOSCOPY (Pts 45-44yrs Insurance coverage will need to be confirmed)  06/02/2023 (Originally 10/30/2010)    Pneumococcal Vaccine 48-64 Years old (1 of 2 - PCV) 11/30/2023 (Originally 10/31/1971)   COVID-19 Vaccine (3 - Pfizer risk series) 12/16/2023 (Originally 01/26/2020)   Zoster Vaccines- Shingrix (2 of 2) 01/25/2023   DTaP/Tdap/Td (3 - Td or Tdap) 02/23/2031   Hepatitis C Screening  Completed   HIV Screening  Completed   HPV VACCINES  Aged Out    Discussed health benefits of physical activity, and encouraged him to engage in regular exercise appropriate for his age and condition.  Problem List Items Addressed This Visit   None Visit Diagnoses     Routine general medical examination at a health care facility    -  Primary   Relevant Orders   Lipid Panel w/reflex Direct LDL   PSA   COMPLETE METABOLIC PANEL WITH GFR   CBC   Screening for malignant neoplasm of colon       Relevant Orders   Ambulatory referral to Gastroenterology       Keep up a regular exercise program and make sure you are eating a healthy diet Try to eat 4 servings of dairy a day, or if you are lactose intolerant take a calcium with vitamin D daily.  Your vaccines are up to date.  The rash on the top of his foot recommend switching to topical Lamisil twice a day for 2 to 3 weeks.  If not improving then please let us know.  Return in about 6 months (around 06/02/2023) for Nurse visit for 2nd shingles vaccine.  Beatrice Lecher, MD

## 2022-12-02 LAB — COMPLETE METABOLIC PANEL WITH GFR
AG Ratio: 2.3 (calc) (ref 1.0–2.5)
ALT: 19 U/L (ref 9–46)
AST: 15 U/L (ref 10–35)
Albumin: 4.4 g/dL (ref 3.6–5.1)
Alkaline phosphatase (APISO): 75 U/L (ref 35–144)
BUN: 15 mg/dL (ref 7–25)
CO2: 30 mmol/L (ref 20–32)
Calcium: 9.7 mg/dL (ref 8.6–10.3)
Chloride: 105 mmol/L (ref 98–110)
Creat: 1.12 mg/dL (ref 0.70–1.30)
Globulin: 1.9 g/dL (calc) (ref 1.9–3.7)
Glucose, Bld: 97 mg/dL (ref 65–99)
Potassium: 4.3 mmol/L (ref 3.5–5.3)
Sodium: 142 mmol/L (ref 135–146)
Total Bilirubin: 0.7 mg/dL (ref 0.2–1.2)
Total Protein: 6.3 g/dL (ref 6.1–8.1)
eGFR: 77 mL/min/{1.73_m2} (ref >=60)

## 2022-12-02 LAB — CBC
HCT: 49.6 % (ref 38.5–50.0)
Hemoglobin: 17 g/dL (ref 13.2–17.1)
MCH: 29.5 pg (ref 27.0–33.0)
MCHC: 34.3 g/dL (ref 32.0–36.0)
MCV: 86.1 fL (ref 80.0–100.0)
MPV: 9 fL (ref 7.5–12.5)
Platelets: 226 10*3/uL (ref 140–400)
RBC: 5.76 10*6/uL (ref 4.20–5.80)
RDW: 13 % (ref 11.0–15.0)
WBC: 8.9 10*3/uL (ref 3.8–10.8)

## 2022-12-02 LAB — LIPID PANEL W/REFLEX DIRECT LDL
Cholesterol: 160 mg/dL (ref <200)
HDL: 44 mg/dL (ref >=40)
LDL Cholesterol (Calc): 98 mg/dL (calc)
Non-HDL Cholesterol (Calc): 116 mg/dL (calc) (ref <130)
Total CHOL/HDL Ratio: 3.6 (calc) (ref <5.0)
Triglycerides: 89 mg/dL (ref <150)

## 2022-12-02 LAB — PSA: PSA: 3.58 ng/mL (ref <=4.00)

## 2022-12-04 NOTE — Progress Notes (Signed)
Hi Ronald [name], cholesterol looks better this year.  Just encouraged her to continue to work on healthy food choices and regular exercise.  Bolick panel is normal.  PSA which is the prostate test is normal. Your blood count is normal as well. No anemia

## 2022-12-23 ENCOUNTER — Ambulatory Visit: Admit: 2022-12-23 | Payer: BC Managed Care – PPO

## 2022-12-23 ENCOUNTER — Ambulatory Visit
Admission: EM | Admit: 2022-12-23 | Discharge: 2022-12-23 | Disposition: A | Discharge status: Home / Self Care | Payer: BC Managed Care – PPO | Attending: Family Medicine | Admitting: Family Medicine

## 2022-12-23 ENCOUNTER — Encounter: Payer: Self-pay | Admitting: Emergency Medicine

## 2022-12-23 DIAGNOSIS — B349 Viral infection, unspecified: Secondary | ICD-10-CM | POA: Diagnosis not present

## 2022-12-23 LAB — POCT INFLUENZA A/B
Influenza A, POC: NEGATIVE
Influenza B, POC: NEGATIVE

## 2022-12-23 LAB — POCT RAPID STREP A (OFFICE): Rapid Strep A Screen: NEGATIVE

## 2022-12-23 LAB — POC SARS CORONAVIRUS 2 AG -  ED: SARS Coronavirus 2 Ag: NEGATIVE

## 2022-12-23 MED ORDER — ACETAMINOPHEN 500 MG PO TABS
1000.0000 mg | ORAL_TABLET | Freq: Once | ORAL | Status: AC
  Administered 2022-12-23: 1000 mg via ORAL

## 2022-12-23 NOTE — ED Provider Notes (Signed)
[facility]    CSN: [id number] Arrival date & time: 12/23/22  1453      History   Chief Complaint Chief Complaint  Patient presents with   Sore Throat    HPI RICHARD RITCHEY is a 57 y.o. male.   HPI  Patient is here for a sore throat and fever.  It started yesterday.  His fever today was 102.4.  He is taken no medicine.  He has some congestion and cough as well as a sore throat.  Headache and bodyaches.  Patient states that he has no known exposure to flu/COVID/strep.  Has had COVID vaccinations.  Did not get a flu shot this year.   Past Medical History:  Diagnosis Date   Cancer    Skin   Hyperlipidemia 01/20/2015   Schizoaffective disorder 07/14/2008   Qualifier: Diagnosis of  By: Linford Arnold MD, Catherine     Squamous cell skin cancer, ala nasi     Patient Active Problem List   Diagnosis Date Noted   Bilateral hearing loss due to cerumen impaction 07/19/2021   Dysfunction of both eustachian tubes 07/19/2021   Umbilical hernia without obstruction and without gangrene 03/16/2021   Right knee pain 03/16/2021   GERD (gastroesophageal reflux disease) 06/13/2018   Peroneal tendinitis of left lower extremity 04/10/2018   Hyperlipidemia 01/20/2015   History of basal cell cancer 01/16/2014   MDD (major depressive disorder) 11/01/2013   LACERATION 07/14/2010   BACK PAIN, THORACIC REGION 12/22/2008   FATIGUE 12/22/2008   NIGHT SWEATS 10/27/2008   Schizoaffective disorder (HCC) 07/14/2008    Past Surgical History:  Procedure Laterality Date   SKIN BIOPSY         Home Medications    Prior to Admission medications   Not on File    Family History Family History  Problem Relation Age of Onset   Diabetes Mellitus II Brother    Schizophrenia Maternal Uncle    Alcohol abuse Neg Hx    Anxiety disorder Neg Hx    Bipolar disorder Neg Hx    Dementia Neg Hx    Depression Neg Hx    Drug abuse Neg Hx    Coronary artery disease Neg Hx     Social  History Social History   Tobacco Use   Smoking status: Some Days    Packs/day: 1    Types: Cigarettes, Cigars    Last attempt to quit: 10/30/2008    Years since quitting: 14.1   Smokeless tobacco: Never   Tobacco comments:    Smokes Cigars 1-2 several days a week.   Vaping Use   Vaping Use: Never used  Substance Use Topics   Alcohol use: Yes    Alcohol/week: 3.0 - 4.0 standard drinks of alcohol    Types: 3 - 4 Cans of beer per week    Comment: non-alchoholic   Drug use: No     Allergies   Bee venom and Penicillins   Review of Systems Review of Systems See HPI  Physical Exam Triage Vital Signs ED Triage Vitals  Enc Vitals Group     BP 12/23/22 1503 (!) 149/91     Pulse Rate 12/23/22 1503 (!) 144     Resp 12/23/22 1503 20     Temp 12/23/22 1503 (!) 101.8 F (38.8 C)     Temp Source 12/23/22 1503 Oral     SpO2 12/23/22 1503 98 %     Weight 12/23/22 1505 201 lb (91.2 kg)  Height 12/23/22 1505 5\' 10"  (1.778 m)     Head Circumference --      Peak Flow --      Pain Score 12/23/22 1505 0     Pain Loc --      Pain Edu? --      Excl. in GC? --    No data found.  Updated Vital Signs BP (!) 149/91 (BP Location: Right Arm)   Pulse (!) 144   Temp (!) 101.8 F (38.8 C) (Oral)   Resp 20   Ht 5\' 10"  (1.778 m)   Wt 91.2 kg   SpO2 98%   BMI 28.84 kg/m      Physical Exam Constitutional:      General: He is not in acute distress.    Appearance: He is well-developed. He is ill-appearing.  HENT:     Head: Normocephalic and atraumatic.  Eyes:     Conjunctiva/sclera: Conjunctivae normal.     Pupils: Pupils are equal, round, and reactive to light.  Cardiovascular:     Rate and Rhythm: Normal rate.  Pulmonary:     Effort: Pulmonary effort is normal. No respiratory distress.  Abdominal:     General: There is no distension.     Palpations: Abdomen is soft.  Musculoskeletal:        General: Normal range of motion.     Cervical back: Normal range of motion.   Skin:    General: Skin is warm and dry.  Neurological:     Mental Status: He is alert.      UC Treatments / Results  Labs (all labs ordered are listed, but only abnormal results are displayed) Labs Reviewed  POC SARS CORONAVIRUS 2 AG -  ED  POCT INFLUENZA A/B  POCT RAPID STREP A (OFFICE)    EKG   Radiology No results found.  Procedures Procedures (including critical care time)  Medications Ordered in UC Medications  acetaminophen (TYLENOL) tablet 1,000 mg (1,000 mg Oral Given 12/23/22 1514)    Initial Impression / Assessment and Plan / UC Course  I have reviewed the triage vital signs and the nursing notes.  Pertinent labs & imaging results that were available during my care of the patient were reviewed by me and considered in my medical decision making (see chart for details).     *** Final Clinical Impressions(s) / UC Diagnoses   Final diagnoses:  None   Discharge Instructions   None    ED Prescriptions   None    PDMP not reviewed this encounter.

## 2022-12-23 NOTE — ED Triage Notes (Signed)
Patient c/o sore throat yesterday and fever of 102.4 today.  Patient does have a non-productive cough, slight nasal drainage.  Denies any OTC fever reducers.

## 2022-12-23 NOTE — Discharge Instructions (Addendum)
FLU, COVID and STREP testa are negative This means you have a viral infection Make sure that you are drinking lots of fluids Take Tylenol or ibuprofen for pain and fever May take over-the-counter cough and cold medicine as needed

## 2023-03-13 ENCOUNTER — Encounter: Payer: Self-pay | Admitting: Family Medicine

## 2023-03-13 ENCOUNTER — Ambulatory Visit: Payer: BC Managed Care – PPO | Admitting: Family Medicine

## 2023-03-13 VITALS — BP 128/72 | HR 99 | Ht 71.0 in | Wt 200.0 lb

## 2023-03-13 DIAGNOSIS — M25531 Pain in right wrist: Secondary | ICD-10-CM

## 2023-03-13 DIAGNOSIS — M25532 Pain in left wrist: Secondary | ICD-10-CM

## 2023-03-13 DIAGNOSIS — M25511 Pain in right shoulder: Secondary | ICD-10-CM

## 2023-03-13 MED ORDER — MELOXICAM 15 MG PO TABS
15.0000 mg | ORAL_TABLET | Freq: Every day | ORAL | 1 refills | Status: DC | PRN
Start: 2023-03-13 — End: 2023-10-22

## 2023-03-13 NOTE — Progress Notes (Signed)
Acute Office Visit  Subjective:     Patient ID: Ronald [name], male    DOB: Feb 28, 1966, 57 y.o.   MRN: [id number]  Chief Complaint  Patient presents with   Shoulder Pain   Wrist Pain    Bilateral     HPI Patient is in today for drainage to right shoulder and both wrists after recent fall.  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  He does not really sleep on that right shoulder so he does not notice that there is any discomfort or not.  It has not stopped him from being active.  He is not doing any specific treatment such as heat ice or anti-inflammatories.  Has had bilateral wrist pain since he fell forward after tripping over a concrete year in a parking spot.  It was dark outside.  He landed with both hands outstretched he had of some few scratches on his palms he did not notice any significant bruising or discoloration he says the wrists are better than they were but still a little sore with certain activities.  ROS      Objective:    BP 128/72   Pulse 99   Ht 5\' 11"  (1.803 m)   Wt 200 lb (90.7 kg)   SpO2 100%   BMI 27.89 kg/m    Physical Exam Musculoskeletal:     Comments: Shoulder with normal range of motion.  Negative empty can test.  He had some pain with lifting his hand off his low back but he was able to do it and strength was good.  Nontender over the shoulder joint itself.  Both wrists are nontender, no swelling, normal range of motion, normal strength.  Negative Finkelstein's test.     No results found for any visits on 03/13/23.      Assessment & Plan:   Problem List Items Addressed This Visit   None Visit Diagnoses     Acute pain  of right shoulder    -  Primary   Relevant Medications   meloxicam (MOBIC) 15 MG tablet   Bilateral wrist pain       Relevant Medications   meloxicam (MOBIC) 15 MG tablet      Right shoulder pain the area of pain and discomfort is close to the insertion of the biceps tendon.  Will give him some exercises to do on his own at home over the next 2 to 3 weeks.  Also given work note to be out for 1 week so that he can do the exercises and try to rest the shoulder he has to do a lot of lifting and motion and scrubbing with that shoulder for his job at least 2 to 3 hours a day.  If not improving then plan will be to get him in with sports medicine for further workup and possible formal physical therapy.  Note provided to be out for 1 week.  Also recommended trial of an anti-inflammatory and ice as needed.  Your wrist pain-strength is intact.  No sign of nerve impingement.  Sometimes he is actually getting more locking of his fingers more consistent with trigger finger particularly in his fourth and  fifth digits on his left hand.  If that becomes more frequent or problematic then recommend evaluation with sports medicine to discuss possible treatment.  In the meantime okay to use the anti-inflammatory  Meds ordered this encounter  Medications   meloxicam (MOBIC) 15 MG tablet    Sig: Take 1 tablet (15 mg total) by mouth daily as needed for pain.    Dispense:  30 tablet    Refill:  1    Return if symptoms worsen or fail to improve.  Nani Gasser, MD

## 2023-05-31 ENCOUNTER — Telehealth (HOSPITAL_COMMUNITY): Payer: Self-pay | Admitting: Psychiatry

## 2023-05-31 NOTE — Telephone Encounter (Signed)
Patient's mother called requesting to speak with provider. DPR on file allowing contact with her. Caller states that patient has had a behavior change toward her that has lasted a year. States he will not speak to her, walks the other way if they are about to come face-to-face in the house, refuses to do any chores other than mow the yard, leaves for a couple of days a month and doesn't tell her where he is going. States he still works at a [employer]. No other issues/concerns. Last seen by provider December, 2022. Phone number for follow up is [phone].

## 2023-06-04 ENCOUNTER — Encounter: Payer: Self-pay | Admitting: Family Medicine

## 2023-06-04 ENCOUNTER — Ambulatory Visit (INDEPENDENT_AMBULATORY_CARE_PROVIDER_SITE_OTHER): Payer: BC Managed Care – PPO | Admitting: Family Medicine

## 2023-06-04 DIAGNOSIS — Z23 Encounter for immunization: Secondary | ICD-10-CM | POA: Diagnosis not present

## 2023-06-04 NOTE — Progress Notes (Signed)
Agree with documentation as above. Shingles vaccine given.   Nani Gasser, MD

## 2023-06-06 ENCOUNTER — Ambulatory Visit (INDEPENDENT_AMBULATORY_CARE_PROVIDER_SITE_OTHER): Payer: BC Managed Care – PPO | Admitting: Family Medicine

## 2023-06-06 ENCOUNTER — Encounter: Payer: Self-pay | Admitting: Family Medicine

## 2023-06-06 VITALS — BP 133/76 | HR 99 | Temp 99.1°F | Ht 71.0 in | Wt 198.0 lb

## 2023-06-06 DIAGNOSIS — R1030 Lower abdominal pain, unspecified: Secondary | ICD-10-CM | POA: Diagnosis not present

## 2023-06-06 DIAGNOSIS — T50Z95A Adverse effect of other vaccines and biological substances, initial encounter: Secondary | ICD-10-CM | POA: Diagnosis not present

## 2023-06-06 DIAGNOSIS — L821 Other seborrheic keratosis: Secondary | ICD-10-CM

## 2023-06-06 LAB — POCT URINALYSIS DIP (CLINITEK)
Bilirubin, UA: NEGATIVE
Glucose, UA: NEGATIVE mg/dL
Ketones, POC UA: NEGATIVE mg/dL
Nitrite, UA: NEGATIVE
POC PROTEIN,UA: NEGATIVE
Spec Grav, UA: 1.015 (ref 1.010–1.025)
Urobilinogen, UA: 0.2 E.U./dL
pH, UA: 5.5 (ref 5.0–8.0)

## 2023-06-06 NOTE — Progress Notes (Signed)
Acute Office Visit  Subjective:     Patient ID: Ronald [name], male    DOB: [birth year], 57 y.o.   MRN: [id number]  Chief Complaint  Patient presents with   Abdominal Pain   Generalized Body Aches   low grade fever         HPI Patient is in today for stomach and bodyaches and fever that started the evening after he hd his shingle vaccines vaccine on Monday.    For several weeks also having intermittent sharp abdominal pain around the belt buckle area. He wonders if the buckle is pressing into his abdomen that he has to wear for work.  Wager dietary changes.  No change in bowel movements.  He also felt like he had a possible  UTI.  Describes a pressure-like sensation in the lower pelvic area.  No change in bowel movements.  No dysuria.  No hematuria.  He would like me to look at some skin lesion on upper back and shoulder.     ROS      Objective:    BP 133/76   Pulse 99   Temp 99.1 F (37.3 C)   Ht 5\' 11"  (1.803 m)   Wt 198 lb (89.8 kg)   SpO2 98%   BMI 27.62 kg/m    Physical Exam Vitals and nursing note reviewed.  Constitutional:      Appearance: Normal appearance.  HENT:     Head: Normocephalic and atraumatic.  Eyes:     Conjunctiva/sclera: Conjunctivae normal.  Cardiovascular:     Rate and Rhythm: Normal rate and regular rhythm.  Pulmonary:     Effort: Pulmonary effort is normal.     Breath sounds: Normal breath sounds.  Abdominal:     General: Bowel sounds are normal. There is no distension.     Palpations: Abdomen is soft.     Tenderness: There is no abdominal tenderness.  Skin:    General: Skin is warm and dry.  Neurological:     Mental Status: He is alert.  Psychiatric:        Mood and Affect: Mood normal.     Results for orders placed or performed in visit on 06/06/23  POCT URINALYSIS DIP (CLINITEK)  Result Value Ref Range   Color, UA yellow yellow   Clarity, UA clear clear   Glucose, UA negative negative mg/dL   Bilirubin, UA  negative negative   Ketones, POC UA negative negative mg/dL   Spec Grav, UA 4.010 2.725 - 1.025   Blood, UA trace-lysed (A) negative   pH, UA 5.5 5.0 - 8.0   POC PROTEIN,UA negative negative, trace   Urobilinogen, UA 0.2 0.2 or 1.0 E.U./dL   Nitrite, UA Negative Negative   Leukocytes, UA Trace (A) Negative        Assessment & Plan:   Problem List Items Addressed This Visit   None Visit Diagnoses     Lower abdominal pain    -  Primary   Relevant Orders   POCT URINALYSIS DIP (CLINITEK) (Completed)   Seborrheic keratoses       Vaccine reaction, initial encounter          Lower abdominal pain-no red flag symptoms nontender on exam today reports bowels seem to be moving normally.  He does think it could be related to the belt buckle pressing in when he sits down at work.  He says he might try to find a smaller belt buckle and see if that improves  his symptoms and provide some comfort.  We also did a urine sample today that just showed some trace blood so we will send it for culture just to be sure . He did have several seborrheic keratoses scattered over upper shoulder area that are completely benign gave reassurance.  Do think that he had side effects from the shingles vaccine.  He did miss work yesterday so we will give him a work note.  I do think he should be okay to go back to work this evening if not please let us know.  No orders of the defined types were placed in this encounter.   No follow-ups on file.  Nani Gasser, MD

## 2023-06-11 ENCOUNTER — Encounter: Payer: Self-pay | Admitting: Family Medicine

## 2023-06-12 NOTE — Telephone Encounter (Signed)
Which results?

## 2023-06-14 NOTE — Telephone Encounter (Signed)
If he is still having sxs let's collect a culture

## 2023-10-22 ENCOUNTER — Ambulatory Visit: Payer: Self-pay | Admitting: Family Medicine

## 2023-10-22 ENCOUNTER — Ambulatory Visit (INDEPENDENT_AMBULATORY_CARE_PROVIDER_SITE_OTHER): Payer: 59

## 2023-10-22 ENCOUNTER — Other Ambulatory Visit: Payer: Self-pay

## 2023-10-22 VITALS — BP 192/102 | HR 115 | Ht 71.0 in | Wt 207.0 lb

## 2023-10-22 DIAGNOSIS — M546 Pain in thoracic spine: Secondary | ICD-10-CM | POA: Diagnosis not present

## 2023-10-22 DIAGNOSIS — M545 Low back pain, unspecified: Secondary | ICD-10-CM | POA: Diagnosis not present

## 2023-10-22 DIAGNOSIS — M25511 Pain in right shoulder: Secondary | ICD-10-CM

## 2023-10-22 DIAGNOSIS — G8929 Other chronic pain: Secondary | ICD-10-CM | POA: Diagnosis not present

## 2023-10-22 NOTE — Progress Notes (Signed)
 Joanna Muck, PhD, LAT, ATC acting as a scribe for Garlan Juniper, MD.  Ronald [name] is a 58 y.o. male who presents to [facility] today for back pain ongoing for about 77-month. He noticed the pain after using a massage chair. He feels like his low back is "weak" and has trouble getting through a full work shift. Pt locates pain to the midline of his low back and into thoracic spine.   Radiating pain: no LE numbness/tingling: no LE weakness: no Aggravates: prolonged standing, increased activity Treatments tried: stretching   Pt also c/o R shoulder pain that first started back in July. PCP provided some resistance exercises. Pt locates pain to throughout should joint. 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   Pertinent review of systems: No fevers or chills  Relevant historical information: Schizoaffective disorder controlled.   Exam:  BP (!) 192/102   Pulse (!) 115   Ht 5\' 11"  (1.803 m)   Wt 207 lb (93.9 kg)   SpO2 96%   BMI 28.87 kg/m  General: Well Developed, well nourished, and in no acute distress.   MSK: Right shoulder normal.  Normal motion.  Intact strength. Positive Hawkins and Neer's test. Negative Yergason's and speeds test.  T-spine normal.  Nontender to palpation midline.  Tender palpation paraspinal musculature.  L-spine nontender palpation midline. Tender palpation lumbar paraspinal musculature.  Lower extremity strength is intact. Reflexes are intact. Normal spine motion.    Lab and Radiology Results  Diagnostic Limited MSK Ultrasound of: Right shoulder Biceps tendon intact and normal-appearing. Subscapularis tendon is intact and normal-appearing. Supraspinatus tendon is intact and normal-appearing. Infraspinatus tendon is intact and normal-appearing. AC joint mild effusion. Impression: DJD   X-ray images T-spine, L-spine, and right shoulder  obtained today personally and independently interpreted.  Right shoulder: No acute fractures.  No significant glenohumeral DJD.  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  T-spine: No acute fractures.  Mild degenerative changes.  Lumbar spine: Mild DDD.  No acute fractures.  Await formal radiology review   Assessment and Plan: 58 y.o. male with chronic right shoulder pain and right thoracic and lumbar pain.  Chronic ongoing issue for years worsening recently.  No severe abnormality seen on imaging today.  Plan for trial of physical therapy.  Recheck in 6 to 8 weeks.   PDMP not reviewed this encounter. Orders Placed This Encounter  Procedures   US  LIMITED JOINT SPACE STRUCTURES UP RIGHT(NO LINKED CHARGES)    Reason for Exam (SYMPTOM  OR DIAGNOSIS REQUIRED):   right shoulder pain    Preferred imaging location?:   [facility] Shoulder Right    Standing Status:   Future    Number of Occurrences:   1    Expiration Date:   10/21/2024    Reason for Exam (SYMPTOM  OR DIAGNOSIS REQUIRED):   right shoulder pain    Preferred imaging location?:   [place] [facility]   DG Lumbar Spine 2-3 Views    Standing Status:   Future    Number of Occurrences:   1    Expiration Date:   11/19/2023    Reason for Exam (SYMPTOM  OR DIAGNOSIS REQUIRED):   low back pain    Preferred imaging location?:   [place] [facility]   DG Thoracic Spine 2 View    Standing Status:   Future  Number of Occurrences:   1    Expiration Date:   10/21/2024    Reason for Exam (SYMPTOM  OR DIAGNOSIS REQUIRED):   eval pain tspine    Preferred imaging location?:   [place] [facility]   No orders of the defined types were placed in this encounter.    Discussed warning signs or symptoms. Please see discharge instructions. Patient expresses understanding.   The above documentation has been reviewed and is accurate and complete Garlan Juniper, M.D.

## 2023-10-22 NOTE — Patient Instructions (Addendum)
 Thank you for coming in today.   Please get an Xray today before you leave   I've referred you to Physical Therapy.  Let us know if you don't hear from them in one week.   Recheck in 8 weeks.

## 2023-10-30 ENCOUNTER — Telehealth (HOSPITAL_COMMUNITY): Payer: Self-pay | Admitting: Psychiatry

## 2023-10-30 NOTE — Telephone Encounter (Signed)
Patient's mother walked into clinic requesting to speak with provider about patient. DPR on file to permit this. Phone number is [phone]. Requests call after 1530 as patient will be at work.

## 2023-11-01 NOTE — Telephone Encounter (Signed)
Medication management - Spoke to patient's mother to provide information for anyone who may be in crisis and needs to be seen, that they could be taken to the [facility] if in crisis and they would agree to go. Informed of the IVC process if anyone felt someone was a danger to self or others so they could have them brought in for an emergency evaluation.  Collateral stated she would be worried about doing this with patient due to how he might take it and respond to such an act.  Informed anyone  could be taken to the [facility] or a local ED if they would agree to an evaluation and in crisis too.  Collateral stated understanding what could be done and denied crisis at this time.

## 2023-11-02 ENCOUNTER — Encounter: Payer: Self-pay | Admitting: Family Medicine

## 2023-11-02 ENCOUNTER — Encounter (HOSPITAL_COMMUNITY): Payer: Self-pay | Admitting: Psychiatry

## 2023-11-02 DIAGNOSIS — M546 Pain in thoracic spine: Secondary | ICD-10-CM

## 2023-11-02 DIAGNOSIS — M545 Low back pain, unspecified: Secondary | ICD-10-CM

## 2023-11-02 DIAGNOSIS — G8929 Other chronic pain: Secondary | ICD-10-CM

## 2023-11-02 NOTE — Progress Notes (Signed)
Thoracic spine x-ray shows a little bit of arthritis.

## 2023-11-02 NOTE — Progress Notes (Signed)
Right shoulder x-ray shows a little bit of arthritis at the top of the shoulder.

## 2023-11-02 NOTE — Progress Notes (Signed)
Low back x-ray shows arthritis changes.

## 2023-11-05 NOTE — Telephone Encounter (Signed)
 Forwarding to Dr. Denyse Amass to review and advise.

## 2023-11-19 NOTE — Therapy (Signed)
  OUTPATIENT PHYSICAL THERAPY NOTE   Patient Name: Ronald [name] MRN: [id number] DOB:1965-11-05,58 y.o., male Today's Date: 11/20/2023   END OF SESSION:  PT End of Session - 11/20/23 0940     Visit Number 0              Past Medical History:  Diagnosis Date   Cancer (HCC)    Skin   Hyperlipidemia 01/20/2015   Schizoaffective disorder (HCC) 07/14/2008   Qualifier: Diagnosis of  By: Linford Arnold MD, Catherine     Squamous cell skin cancer, ala nasi    Past Surgical History:  Procedure Laterality Date   SKIN BIOPSY     Patient Active Problem List   Diagnosis Date Noted   Bilateral hearing loss due to cerumen impaction 07/19/2021   Dysfunction of both eustachian tubes 07/19/2021   Umbilical hernia without obstruction and without gangrene 03/16/2021   Right knee pain 03/16/2021   GERD (gastroesophageal reflux disease) 06/13/2018   Peroneal tendinitis of left lower extremity 04/10/2018   Hyperlipidemia 01/20/2015   History of basal cell cancer 01/16/2014   MDD (major depressive disorder) 11/01/2013   Open wound 07/14/2010   BACK PAIN, THORACIC REGION 12/22/2008   FATIGUE 12/22/2008   NIGHT SWEATS 10/27/2008   Schizoaffective disorder (HCC) 07/14/2008    PCP: Agapito Games, MD   REFERRING PROVIDER:   Rodolph Bong, MD    REFERRING DIAG:  [phone] (ICD-10-CM) - Chronic right shoulder pain  M54.50,G89.29 (ICD-10-CM) - Chronic midline low back pain without sciatica  M54.6 (ICD-10-CM) - Pain in thoracic spine    Patient arrived for PT evaluation, though due to cost did not want to undergo PT evaluation. Therefore no PT evaluation was performed today.   Letitia Libra, PT, DPT, ATC 11/20/23 9:58 AM

## 2023-11-20 ENCOUNTER — Ambulatory Visit

## 2023-12-17 ENCOUNTER — Encounter: Payer: Self-pay | Admitting: Family Medicine

## 2023-12-17 ENCOUNTER — Ambulatory Visit: Payer: 59 | Admitting: Family Medicine

## 2023-12-17 VITALS — BP 140/92 | HR 104 | Ht 71.0 in | Wt 204.0 lb

## 2023-12-17 DIAGNOSIS — G8929 Other chronic pain: Secondary | ICD-10-CM | POA: Diagnosis not present

## 2023-12-17 DIAGNOSIS — M25511 Pain in right shoulder: Secondary | ICD-10-CM | POA: Diagnosis not present

## 2023-12-17 DIAGNOSIS — R202 Paresthesia of skin: Secondary | ICD-10-CM | POA: Diagnosis not present

## 2023-12-17 NOTE — Patient Instructions (Addendum)
 Thank you for coming in today.   Carpal tunnel wrist brace  Please work on the home exercises the athletic trainer went over with you: View at [email or web address] using code [email or web address]  Can prescribe prednisone and gabapentin if you would like

## 2023-12-17 NOTE — Progress Notes (Signed)
   I, Stevenson Clinch, CMA acting as a scribe for Clementeen Graham, MD.  Ronald [name] is a 58 y.o. male who presents to [facility] at [facility] today for f/u R shoulder and back pain. Pt was last seen by Dr. Denyse Amass on 10/22/23 and was referred to PT, but did not complete any visits, do to cost.  Today, pt reports continued pain in the shoulder and back. Unable to attend PT d/t cost but has been working out independently. Sx have improved slightly but missed work this past Friday d/t sx exacerbation. Will get n/t into the hand, 2nd, and 3rd fingers. Neck feels OK right now. Has been working on core strength which has been some helpful for back sx. Has older mattress, feels that this may be contributing. Feels a bulge in the middle back. Has tried massage chair which exacerbated sx.   Dx imaging: 10/22/23 T-spine, L-spine, & R shoulder XR  Pertinent review of systems: No fevers or chills  Relevant historical information: History of ankle tendinitis.  History of depression and schizoaffective disorder.   Exam:  BP (!) 140/92   Pulse (!) 104   Ht 5\' 11"  (1.803 m)   Wt 204 lb (92.5 kg)   SpO2 98%   BMI 28.45 kg/m  General: Well Developed, well nourished, and in no acute distress.   MSK: C-spine: Normal appearing Normal cervical motion. Upper extremity strength is intact. Reflexes are intact. Negative Spurling's test.  Right shoulder some pain with abduction.  Intact strength.  Right wrist normal appearing Mildly positive Tinel's carpal tunnel. Mildly positive Phalen's test. Strength is intact.      Assessment and Plan: 58 y.o. male with transient right hand paresthesia.  Distribution of symptoms are consistent primarily with C7 radiculopathy.  Carpal tunnel could also have been a possibility and may be currently active.  Plan for carpal tunnel wrist brace.  Consider prednisone and gabapentin.  Patient wants to avoid those medicines for now which are  reasonable.  As for the continued right shoulder pain reviewed home exercise program and taught this in clinic.  Obviously physical therapy would be more effective but it is quite expensive for him so we are going to try some conservative management for now.   PDMP not reviewed this encounter. No orders of the defined types were placed in this encounter.  No orders of the defined types were placed in this encounter.    Discussed warning signs or symptoms. Please see discharge instructions. Patient expresses understanding.   The above documentation has been reviewed and is accurate and complete Clementeen Graham, M.D.

## 2024-01-19 ENCOUNTER — Ambulatory Visit
Admission: EM | Admit: 2024-01-19 | Discharge: 2024-01-19 | Disposition: A | Discharge status: Home / Self Care | Attending: Family Medicine | Admitting: Family Medicine

## 2024-01-19 ENCOUNTER — Encounter: Payer: Self-pay | Admitting: Emergency Medicine

## 2024-01-19 DIAGNOSIS — R030 Elevated blood-pressure reading, without diagnosis of hypertension: Secondary | ICD-10-CM

## 2024-01-19 DIAGNOSIS — K429 Umbilical hernia without obstruction or gangrene: Secondary | ICD-10-CM | POA: Diagnosis not present

## 2024-01-19 NOTE — ED Provider Notes (Signed)
 [facility]    CSN: [id number] Arrival date & time: 01/19/24  1429      History   Chief Complaint Chief Complaint  Patient presents with   Inguinal Hernia    HPI Ronald [name] is a 58 y.o. male.   Known umbilical hernia.  Been present for many years.  He was referred to a surgeon 3 years ago for consultation but he decided not to have it operated upon.  The co-pay and insurance was too high, and the hernia did not bother him very much.  Today he was going through a narrow doorway in his belly when rubbed up against the hernia area forcibly.  Now feels more sore and more painful.  He would like to have it reevaluated to see if there is any "internal bleeding" or complication    Past Medical History:  Diagnosis Date   Cancer (HCC)    Skin   Hyperlipidemia 01/20/2015   Schizoaffective disorder (HCC) 07/14/2008   Qualifier: Diagnosis of  By: Greer Leak MD, Catherine     Squamous cell skin cancer, ala nasi     Patient Active Problem List   Diagnosis Date Noted   Bilateral hearing loss due to cerumen impaction 07/19/2021   Dysfunction of both eustachian tubes 07/19/2021   Umbilical hernia without obstruction and without gangrene 03/16/2021   Right knee pain 03/16/2021   GERD (gastroesophageal reflux disease) 06/13/2018   Peroneal tendinitis of left lower extremity 04/10/2018   Hyperlipidemia 01/20/2015   History of basal cell cancer 01/16/2014   MDD (major depressive disorder) 11/01/2013   Open wound 07/14/2010   BACK PAIN, THORACIC REGION 12/22/2008   FATIGUE 12/22/2008   NIGHT SWEATS 10/27/2008   Schizoaffective disorder (HCC) 07/14/2008    Past Surgical History:  Procedure Laterality Date   SKIN BIOPSY         Home Medications    Prior to Admission medications   Not on File    Family History Family History  Problem Relation Age of Onset   Diabetes Mellitus II Brother    Schizophrenia Maternal Uncle    Alcohol abuse Neg Hx    Anxiety  disorder Neg Hx    Bipolar disorder Neg Hx    Dementia Neg Hx    Depression Neg Hx    Drug abuse Neg Hx    Coronary artery disease Neg Hx     Social History Social History   Tobacco Use   Smoking status: Some Days    Current packs/day: 0.00    Types: Cigarettes, Cigars    Last attempt to quit: 10/30/2008    Years since quitting: 15.2   Smokeless tobacco: Never   Tobacco comments:    Smokes Cigars 1-2 several days a week.   Vaping Use   Vaping status: Never Used  Substance Use Topics   Alcohol use: Yes    Alcohol/week: 3.0 - 4.0 standard drinks of alcohol    Types: 3 - 4 Cans of beer per week    Comment: non-alchoholic   Drug use: No     Allergies   Bee venom and Penicillins   Review of Systems Review of Systems See HPI  Physical Exam Triage Vital Signs ED Triage Vitals [01/19/24 1439]  Encounter Vitals Group     BP (!) 160/104     Systolic BP Percentile      Diastolic BP Percentile      Pulse Rate (!) 102     Resp 18  Temp 98.7 F (37.1 C)     Temp Source Oral     SpO2 97 %     Weight 198 lb (89.8 kg)     Height 5\' 11"  (1.803 m)     Head Circumference      Peak Flow      Pain Score 0     Pain Loc      Pain Education      Exclude from Growth Chart    No data found.  Updated Vital Signs BP (!) 160/104 (BP Location: Right Arm)   Pulse (!) 102   Temp 98.7 F (37.1 C) (Oral)   Resp 18   Ht 5\' 11"  (1.803 m)   Wt 89.8 kg   SpO2 97%   BMI 27.62 kg/m   Visual Acuity Right Eye Distance:   Left Eye Distance:   Bilateral Distance:    Right Eye Near:   Left Eye Near:    Bilateral Near:     Physical Exam Constitutional:      General: He is not in acute distress.    Appearance: He is well-developed.  HENT:     Head: Normocephalic and atraumatic.  Eyes:     Conjunctiva/sclera: Conjunctivae normal.     Pupils: Pupils are equal, round, and reactive to light.  Cardiovascular:     Rate and Rhythm: Normal rate.  Pulmonary:     Effort:  Pulmonary effort is normal. No respiratory distress.  Abdominal:     General: There is no distension.     Palpations: Abdomen is soft.     Tenderness: There is abdominal tenderness.     Hernia: A hernia is present.     Comments: Umbilical hernia is present just above the umbilicus.  Measures about 6 cm across.  It is prominent with Valsalva.  Abdomen is flat and nontender when he is supine.  This is reducible with no evidence of incarceration or complication  Musculoskeletal:        General: Normal range of motion.     Cervical back: Normal range of motion.  Skin:    General: Skin is warm and dry.  Neurological:     Mental Status: He is alert.      UC Treatments / Results  Labs (all labs ordered are listed, but only abnormal results are displayed) Labs Reviewed - No data to display  EKG   Radiology No results found.  Procedures Procedures (including critical care time)  Medications Ordered in UC Medications - No data to display  Initial Impression / Assessment and Plan / UC Course  I have reviewed the triage vital signs and the nursing notes.  Pertinent labs & imaging results that were available during my care of the patient were reviewed by me and considered in my medical decision making (see chart for details).     Blood pressure is elevated in the office today.  Patient is advised to follow-up with his primary care doctor Final Clinical Impressions(s) / UC Diagnoses   Final diagnoses:  Umbilical hernia without obstruction or gangrene  Elevated blood pressure reading in office without diagnosis of hypertension     Discharge Instructions      Avoid heavy lifting until pain and swelling have resolved Call your doctor for problems  ED Prescriptions   None    PDMP not reviewed this encounter.   Stephany Ehrich, MD 01/19/24 [phone]

## 2024-01-19 NOTE — ED Triage Notes (Signed)
 Patient states that he has an umbilical hernia that has not caused any problems.  Today he rubbed against a door and has some swelling now.  Patient denies any OTC meds.

## 2024-01-19 NOTE — Discharge Instructions (Signed)
 Avoid heavy lifting until pain and swelling have resolved Call your doctor for problems

## 2024-02-21 ENCOUNTER — Ambulatory Visit (INDEPENDENT_AMBULATORY_CARE_PROVIDER_SITE_OTHER): Admitting: Family Medicine

## 2024-02-21 VITALS — BP 134/82 | HR 75 | Ht 71.0 in | Wt 200.0 lb

## 2024-02-21 DIAGNOSIS — E785 Hyperlipidemia, unspecified: Secondary | ICD-10-CM | POA: Diagnosis not present

## 2024-02-21 DIAGNOSIS — F439 Reaction to severe stress, unspecified: Secondary | ICD-10-CM | POA: Diagnosis not present

## 2024-02-21 DIAGNOSIS — M25511 Pain in right shoulder: Secondary | ICD-10-CM

## 2024-02-21 DIAGNOSIS — F259 Schizoaffective disorder, unspecified: Secondary | ICD-10-CM | POA: Diagnosis not present

## 2024-02-21 DIAGNOSIS — G8929 Other chronic pain: Secondary | ICD-10-CM

## 2024-02-21 NOTE — Patient Instructions (Signed)
 I will put referral in for family therapy.

## 2024-02-21 NOTE — Progress Notes (Signed)
 Established Patient Office Visit  Subjective  Patient ID: Ronald [name], male    DOB: 1966-08-18  Age: 58 y.o. MRN: [id number]  No chief complaint on file.   HPI  Here today to discuss mood. He has a history of schizoaffective disorder.  He was previously being followed by Dr. Aram [name], with psychology.  He was controlled with Abilify  and fluoxetine  for several years up until 2022 when he quit seeking psychiatric care.  He is here today with his mother Ronald [name] who is also my patient they live in the same household.  He is working but often takes days off because of his shoulder pain and his back pain.  He does not contribute to the household fines but says he is in debt but says he does not spend money excessively.  Only getting about 4 hours of sleep at night. Says got a new mattress but he says that his sister who comes to visit on the weekends did something to the mattress so now he is not sleeping comfortably and only getting about 4 hours of sleep.  He says he falls asleep quickly but then wakes up and does not go back to sleep.  He feels rested when he gets up.  He was on trazodone  couple years ago for sleep but says it really was not that helpful.  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  He is also been taking some days off here and there because of shoulder and back pain.  He says he has the time to take off so he does not feel he is calling out unnecessarily.  He does find that he spending a lot of time on YouTube instead of being a little bit more motivated when he is home to get things done.  His mom who is here with him today gets frustrated because she does ask him to do things and has to get on him about doing it.  He says he has been going to the gym most days of the week and working out very regularly.  His mom who is with him today says that she was not  aware that he was exercising.  Says most days he feels good.  He feels like one of his biggest issues is his sister who comes to spend time on the weekends.  He feels like his sister is very aggressive towards him and belittling and does things to his personal possessions.    ROS    Objective:     BP 134/82 (BP Location: Right Arm, Cuff Size: Large)   Pulse 75   Ht 5' 11 (1.803 m)   Wt 200 lb 0.6 oz (90.7 kg)   SpO2 98%   BMI 27.90 kg/m    Physical Exam Vitals reviewed.  Constitutional:      Appearance: Normal appearance.  HENT:     Head: Normocephalic.  Pulmonary:     Effort: Pulmonary effort is normal.   Neurological:     Mental Status: He is alert and oriented to person, place, and time.   Psychiatric:        Mood and Affect: Mood normal.        Behavior: Behavior normal.      Results for orders placed or performed in visit on 02/21/24  Microscopic Examination  Result Value Ref Range   WBC, UA 0-5 0 -  5 /hpf   RBC, Urine None seen 0 - 2 /hpf   Epithelial Cells (non renal) None seen 0 - 10 /hpf   Casts None seen None seen /lpf   Bacteria, UA None seen None seen/Few  TSH  Result Value Ref Range   TSH 1.590 0.450 - 4.500 uIU/mL  CMP14+EGFR  Result Value Ref Range   Glucose 160 (H) 70 - 99 mg/dL   BUN 16 6 - 24 mg/dL   Creatinine, Ser 3.08 0.76 - 1.27 mg/dL   eGFR 75 >65 HQ/ION/6.29   BUN/Creatinine Ratio 14 9 - 20   Sodium 137 134 - 144 mmol/L   Potassium 3.8 3.5 - 5.2 mmol/L   Chloride 100 96 - 106 mmol/L   CO2 21 20 - 29 mmol/L   Calcium 9.4 8.7 - 10.2 mg/dL   Total Protein 6.6 6.0 - 8.5 g/dL   Albumin 4.6 3.8 - 4.9 g/dL   Globulin, Total 2.0 1.5 - 4.5 g/dL   Bilirubin Total 0.3 0.0 - 1.2 mg/dL   Alkaline Phosphatase 88 44 - 121 IU/L   AST 18 0 - 40 IU/L   ALT 19 0 - 44 IU/L  Urinalysis, Routine w reflex microscopic  Result Value Ref Range   Specific Gravity, UA 1.017 1.005 - 1.030   pH, UA 5.5 5.0 - 7.5   Color, UA Yellow Yellow    Appearance Ur Clear Clear   Leukocytes,UA 1+ (A) Negative   Protein,UA Negative Negative/Trace   Glucose, UA Negative Negative   Ketones, UA Negative Negative   RBC, UA Negative Negative   Bilirubin, UA Negative Negative   Urobilinogen, Ur 0.2 0.2 - 1.0 mg/dL   Nitrite, UA Negative Negative   Microscopic Examination See below:   RPR  Result Value Ref Range   RPR Ser Ql Non Reactive Non Reactive   Interpretation: Comment   Lipid panel  Result Value Ref Range   Cholesterol, Total 180 100 - 199 mg/dL   Triglycerides 528 (H) 0 - 149 mg/dL   HDL 45 >41 mg/dL   VLDL Cholesterol Cal 31 5 - 40 mg/dL   LDL Chol Calc (NIH) 324 (H) 0 - 99 mg/dL   Chol/HDL Ratio 4.0 0.0 - 5.0 ratio  Drug Profile, Ur, 9 Drugs  Result Value Ref Range   Amphetamines, Urine Negative Cutoff=1000 ng/mL   Barbiturate Quant, Ur Negative Cutoff=300 ng/mL   Benzodiazepine Quant, Ur Negative Cutoff=300 ng/mL   Cannabinoid Quant, Ur Negative Cutoff=50 ng/mL   Cocaine (Metab.) Negative Cutoff=300 ng/mL   OPIATE SCREEN URINE Negative Cutoff=300 ng/mL   PCP Quant, Ur Negative Cutoff=25 ng/mL   Methadone Screen, Urine Negative Cutoff=300 ng/mL   Propoxyphene Negative Cutoff=300 ng/mL   Creatinine, Urine 88.1 20.0 - 300.0 mg/dL   Nitrite Urine, Quantitative Negative Cutoff=200 mcg/mL   pH, Urine 5.3 4.5 - 8.9      The 10-year ASCVD risk score (Arnett DK, et al., 2019) is: 12.9%    Assessment & Plan:   Problem List Items Addressed This Visit       Other   Schizoaffective disorder (HCC) - Primary   He seems frustrated with living conditions at home.  He feels like his mom who is here today as well as his sister who will drop by on the weekends try to confront him and yell at him.  He does not actually contribute to the household financially he says he does not have enough money but when I asked where the money was going  and what he was spending it on he really could not tell me as he is gainfully employed  currently.  I do think he would benefit from being back on medications to stabilize his mood.  But he is not interested and feels like overall he is doing well.  We did discuss how his poor sleep quality could be significantly impacting his mood as well but again is not interested in particular treatment options at this time.  I think he would actually do pretty well with a mood stabilizer that could be used for sleep and to help with mood.  Just encouraged him to think about it.  I do think it would be helpful for him to be connected back in with psychiatry.  He says he is open to family therapy/counseling.      Relevant Orders   TSH (Completed)   CMP14+EGFR (Completed)   Urinalysis, Routine w reflex microscopic (Completed)   RPR (Completed)   Lipid panel (Completed)   Drug Profile, Ur, 9 Drugs (Completed)   Hyperlipidemia   Will get updated labs.      Relevant Orders   TSH (Completed)   CMP14+EGFR (Completed)   Urinalysis, Routine w reflex microscopic (Completed)   RPR (Completed)   Lipid panel (Completed)   Drug Profile, Ur, 9 Drugs (Completed)   Chronic right shoulder pain   He has previously seen Dr. Anson Kinnier, sports medicine.  I am concerned that if he is having to call out of work because of his shoulder and low back pain I really think he should try to get back in with sports med again and discuss a more long-term plan.  It may just be that he is not able to continue to do the heavy work of cleaning.  But again there may be some other things that they could certainly consider so that again he is not missing work.      Relevant Orders   Ambulatory referral to Sports Medicine   Other Visit Diagnoses       Stress at home       Relevant Orders   Ambulatory referral to Behavioral Health       Return in about 6 weeks (around 04/03/2024).    Duaine German, MD

## 2024-02-22 ENCOUNTER — Encounter: Payer: Self-pay | Admitting: Family Medicine

## 2024-02-22 ENCOUNTER — Ambulatory Visit: Payer: Self-pay | Admitting: Family Medicine

## 2024-02-22 DIAGNOSIS — G8929 Other chronic pain: Secondary | ICD-10-CM | POA: Insufficient documentation

## 2024-02-22 LAB — LIPID PANEL
Chol/HDL Ratio: 4 ratio (ref 0.0–5.0)
Cholesterol, Total: 180 mg/dL (ref 100–199)
HDL: 45 mg/dL (ref >39)
LDL Chol Calc (NIH): 104 mg/dL — ABNORMAL HIGH (ref 0–99)
Triglycerides: 178 mg/dL — ABNORMAL HIGH (ref 0–149)
VLDL Cholesterol Cal: 31 mg/dL (ref 5–40)

## 2024-02-22 LAB — MICROSCOPIC EXAMINATION
Bacteria, UA: NONE SEEN
Casts: NONE SEEN /LPF
Epithelial Cells (non renal): NONE SEEN /HPF (ref 0–10)
RBC, Urine: NONE SEEN /HPF (ref 0–2)

## 2024-02-22 LAB — CMP14+EGFR
ALT: 19 IU/L (ref 0–44)
AST: 18 IU/L (ref 0–40)
Albumin: 4.6 g/dL (ref 3.8–4.9)
Alkaline Phosphatase: 88 IU/L (ref 44–121)
BUN/Creatinine Ratio: 14 (ref 9–20)
BUN: 16 mg/dL (ref 6–24)
Bilirubin Total: 0.3 mg/dL (ref 0.0–1.2)
CO2: 21 mmol/L (ref 20–29)
Calcium: 9.4 mg/dL (ref 8.7–10.2)
Chloride: 100 mmol/L (ref 96–106)
Creatinine, Ser: 1.14 mg/dL (ref 0.76–1.27)
Globulin, Total: 2 g/dL (ref 1.5–4.5)
Glucose: 160 mg/dL — ABNORMAL HIGH (ref 70–99)
Potassium: 3.8 mmol/L (ref 3.5–5.2)
Sodium: 137 mmol/L (ref 134–144)
Total Protein: 6.6 g/dL (ref 6.0–8.5)
eGFR: 75 mL/min/{1.73_m2} (ref >59)

## 2024-02-22 LAB — URINALYSIS, ROUTINE W REFLEX MICROSCOPIC
Bilirubin, UA: NEGATIVE
Glucose, UA: NEGATIVE
Ketones, UA: NEGATIVE
Nitrite, UA: NEGATIVE
Protein,UA: NEGATIVE
RBC, UA: NEGATIVE
Specific Gravity, UA: 1.017 (ref 1.005–1.030)
Urobilinogen, Ur: 0.2 mg/dL (ref 0.2–1.0)
pH, UA: 5.5 (ref 5.0–7.5)

## 2024-02-22 LAB — RPR: RPR Ser Ql: NONREACTIVE

## 2024-02-22 LAB — DRUG PROFILE, UR, 9 DRUGS (LABCORP)
Amphetamines, Urine: NEGATIVE ng/mL
Barbiturate Quant, Ur: NEGATIVE ng/mL
Benzodiazepine Quant, Ur: NEGATIVE ng/mL
Cannabinoid Quant, Ur: NEGATIVE ng/mL
Cocaine (Metab.): NEGATIVE ng/mL
Creatinine, Urine: 88.1 mg/dL (ref 20.0–300.0)
Methadone Screen, Urine: NEGATIVE ng/mL
Nitrite Urine, Quantitative: NEGATIVE ug/mL
OPIATE SCREEN URINE: NEGATIVE ng/mL
PCP Quant, Ur: NEGATIVE ng/mL
Propoxyphene: NEGATIVE ng/mL
pH, Urine: 5.3 (ref 4.5–8.9)

## 2024-02-22 LAB — TSH: TSH: 1.59 u[IU]/mL (ref 0.450–4.500)

## 2024-02-22 NOTE — Assessment & Plan Note (Signed)
 He seems frustrated with living conditions at home.  He feels like his mom who is here today as well as his sister who will drop by on the weekends try to confront him and yell at him.  He does not actually contribute to the household financially he says he does not have enough money but when I asked where the money was going and what he was spending it on he really could not tell me as he is gainfully employed currently.  I do think he would benefit from being back on medications to stabilize his mood.  But he is not interested and feels like overall he is doing well.  We did discuss how his poor sleep quality could be significantly impacting his mood as well but again is not interested in particular treatment options at this time.  I think he would actually do pretty well with a mood stabilizer that could be used for sleep and to help with mood.  Just encouraged him to think about it.  I do think it would be helpful for him to be connected back in with psychiatry.  He says he is open to family therapy/counseling.

## 2024-02-22 NOTE — Progress Notes (Signed)
 Hi Ronald [name], on your metabolic panel liver function is normal kidney function is stable.  Glucose was elevated but I know you were not completely fasting.  Urine sample was clear no sign of excess protein.  Triglycerides were just slightly elevated and LDL same just slightly elevated.  Continue to work on healthy diet and regular exercise.  Thyroid function is normal.  Negative RPR.

## 2024-02-22 NOTE — Assessment & Plan Note (Signed)
 He has previously seen Dr. Anson Kinnier, sports medicine.  I am concerned that if he is having to call out of work because of his shoulder and low back pain I really think he should try to get back in with sports med again and discuss a more long-term plan.  It may just be that he is not able to continue to do the heavy work of cleaning.  But again there may be some other things that they could certainly consider so that again he is not missing work.

## 2024-02-22 NOTE — Assessment & Plan Note (Signed)
Will get updated labs

## 2024-02-27 ENCOUNTER — Ambulatory Visit: Admitting: Family Medicine

## 2024-02-27 ENCOUNTER — Encounter: Payer: Self-pay | Admitting: Family Medicine

## 2024-02-27 VITALS — BP 134/96 | HR 94 | Ht 71.0 in | Wt 198.0 lb

## 2024-02-27 DIAGNOSIS — G8929 Other chronic pain: Secondary | ICD-10-CM | POA: Diagnosis not present

## 2024-02-27 DIAGNOSIS — M25511 Pain in right shoulder: Secondary | ICD-10-CM

## 2024-02-27 NOTE — Progress Notes (Signed)
   I, Miquel Amen, CMA acting as a scribe for Ronald Juniper, MD.  Ronald [name] is a 58 y.o. male who presents to [facility] at [facility] today for cont'd R shoulder pain. Pt was last seen by Dr. Alease Hunter on 12/17/23 and shoulder HEP was reviewed as he was unable to attend PT d/t cost.  Today, pt reports using the arm less at work which has been beneficial for sx. Sx exacerbated by work. Gets some relief if reaching overhead and internally rotating. Would like to discuss time frame for healing and time out of work for healing and recovery.   Dx imaging: 10/22/23 R shoulder XR  Pertinent review of systems: No fevers or chills  Relevant historical information:: [place] physical therapy has been quite expensive for him and he cannot afford it.  He thinks they are out of network.   Exam:  BP (!) 134/96   Pulse 94   Ht 5' 11 (1.803 m)   Wt 198 lb (89.8 kg)   SpO2 98%   BMI 27.62 kg/m  General: Well Developed, well nourished, and in no acute distress.   MSK: Right shoulder normal-appearing normal motion pain with abduction.    Lab and Radiology Results  EXAM: RIGHT SHOULDER - 2+ VIEW   COMPARISON:  None Available.   FINDINGS: There is no evidence of fracture or dislocation. Mild acromioclavicular degenerative spurring. The glenohumeral joint is normal. No erosions or focal bone abnormality. Soft tissues are unremarkable.   IMPRESSION: Mild acromioclavicular degenerative spurring.     Electronically Signed   By: Chadwick Colonel M.D.   On: 11/02/2023 12:19 I, Ronald [name], personally (independently) visualized and performed the interpretation of the images attached in this note.     Assessment and Plan: 58 y.o. male with chronic right shoulder pain with some improvement with home exercise program.  His pain is not ideally controlled.  His neck step would be either physical therapy, injection, or MRI.  MRI would be quite expensive and is a bit  premature at this time in my opinion.  He wants to take a leave of absence from work for a month to rest the shoulder.  That certainly could help but I think his pain is likely to come back when he returns to work.  Happy to write a leave of absence for 1 month but will also refer [facility] [place] which may be more affordable for him.  He will let me know if there is any problems with this.  On recheck in a month if not much better consider steroid injection.   PDMP not reviewed this encounter. Orders Placed This Encounter  Procedures   Ambulatory referral to Physical Therapy    Referral Priority:   Routine    Referral Type:   Physical Medicine    Referral Reason:   Specialty Services Required    Requested Specialty:   Physical Therapy    Number of Visits Requested:   1   No orders of the defined types were placed in this encounter.    Discussed warning signs or symptoms. Please see discharge instructions. Patient expresses understanding.   The above documentation has been reviewed and is accurate and complete Ronald [name], M.D.

## 2024-02-27 NOTE — Patient Instructions (Signed)
 Thank you for coming in today.   I've referred you to Physical Therapy.  Let us  know if you don't hear from them in one week.   Work note provided

## 2024-02-28 ENCOUNTER — Encounter: Payer: Self-pay | Admitting: Family Medicine

## 2024-03-12 ENCOUNTER — Telehealth: Payer: Self-pay

## 2024-03-12 NOTE — Telephone Encounter (Signed)
 FMLA form completed, placed on Dr. Virgilio desk to review and sign.

## 2024-03-12 NOTE — Telephone Encounter (Signed)
FMLA form received

## 2024-03-13 NOTE — Telephone Encounter (Signed)
 Form reviewed and signed by Dr. Denyse Amass, placed at the front desk for faxing/scanning.

## 2024-03-28 ENCOUNTER — Ambulatory Visit: Admitting: Family Medicine

## 2024-03-28 VITALS — BP 112/80 | HR 87 | Ht 71.0 in | Wt 197.0 lb

## 2024-03-28 DIAGNOSIS — G8929 Other chronic pain: Secondary | ICD-10-CM | POA: Diagnosis not present

## 2024-03-28 DIAGNOSIS — M25511 Pain in right shoulder: Secondary | ICD-10-CM

## 2024-03-28 NOTE — Progress Notes (Signed)
   I, Claretha Schimke am a scribe for Dr. Artist Lloyd, MD.  Ronald [name] is a 58 y.o. male who presents to [facility] at [facility] today for 71-month f/u R shoulder pain. Pt was last seen by Dr. Lloyd on 02/27/24 and pt requested to take a leave of absence from work for 1 month. Pt also referred to PT @ [facility].  It would have taken 3 weeks to get in and he elected to cancel the appointment as he was feeling better.  Today, pt reports that he needs a letter stating if he can or can not go back to work with or without restrictions. Shoulder is doing pretty good. Rested about 2 to 3 weeks. Swelling is better. Still have some pain on the top of his shoulder and applying heat on that. Doing the arm row bike machine. Patient thinks it is tendinitis and doing pretty okay.  He is eager to return to work.  Dx imaging: 10/22/23 R shoulder XR   Pertinent review of systems: No fevers or chills  Relevant historical information: GERD.  Schizoaffective disorder.   Exam:  BP 112/80   Pulse 87   Ht 5' 11 (1.803 m)   Wt 197 lb (89.4 kg)   SpO2 99%   BMI 27.48 kg/m  General: Well Developed, well nourished, and in no acute distress.   MSK: Right shoulder normal-appearing normal motion intact strength.    Lab and Radiology Results No results found for this or any previous visit (from the past 72 hours). No results found.     Assessment and Plan: 58 y.o. male with right shoulder pain improved with home exercise program and relative rest over the last month.  Plan to continue home exercise program and return to work today or Monday.  Happy to modified work note written today if needed.  Continue home exercise program and consider formal physical therapy or injection if needed.   PDMP not reviewed this encounter. No orders of the defined types were placed in this encounter.  No orders of the defined types were placed in this encounter.    Discussed warning signs or  symptoms. Please see discharge instructions. Patient expresses understanding.   The above documentation has been reviewed and is accurate and complete Artist Lloyd, M.D.

## 2024-04-03 ENCOUNTER — Encounter: Payer: Self-pay | Admitting: Family Medicine

## 2024-04-03 NOTE — Telephone Encounter (Signed)
 Ok to cancel since saw Dr. JAYSON

## 2024-04-09 ENCOUNTER — Ambulatory Visit: Admitting: Family Medicine

## 2024-06-30 ENCOUNTER — Ambulatory Visit

## 2024-06-30 ENCOUNTER — Other Ambulatory Visit: Payer: Self-pay

## 2024-06-30 VITALS — Ht 71.0 in | Wt 192.0 lb

## 2024-06-30 DIAGNOSIS — M7052 Other bursitis of knee, left knee: Secondary | ICD-10-CM

## 2024-06-30 DIAGNOSIS — M25562 Pain in left knee: Secondary | ICD-10-CM

## 2024-06-30 DIAGNOSIS — M1711 Unilateral primary osteoarthritis, right knee: Secondary | ICD-10-CM | POA: Diagnosis not present

## 2024-06-30 DIAGNOSIS — M25561 Pain in right knee: Secondary | ICD-10-CM

## 2024-06-30 DIAGNOSIS — M1712 Unilateral primary osteoarthritis, left knee: Secondary | ICD-10-CM

## 2024-06-30 NOTE — Progress Notes (Signed)
 Subjective:    Patient ID: Ronald [name], male    DOB: 58 y.o., [birth year]   MRN: [id number]  Chief Complaint: Bilateral knee pain  Discussed the use of AI scribe software for clinical note transcription with the patient, who gave verbal consent to proceed.  History of Present Illness Ronald [name] is a 58 year old male who presents with bilateral knee pain and swelling after prolonged gaming sessions. He was referred by [facility] for evaluation of knee pain.  Bilateral knee pain and swelling - Bilateral knee pain and swelling present for 2-3 weeks - Pain and swelling occur after prolonged gaming sessions, especially during deep squatting or kneeling - Pain is diffuse around the entire knee, present bilaterally - Associated stiffness and sensation of swelling - No numbness or tingling - No loss of strength, but feels an obstruction when bending knees - Knees appear swollen and distorted - Pain exacerbated by deep bending, described as 'it wants to tear my skin apart' - No prior knee surgeries or injections - No medications taken for knee pain; icing has not provided significant relief - Considering use of a knee sleeve for compression  Prior knee injuries - Past knee injury seven years ago from a missed step, with full recovery - Deep laceration to knee in teenage years requiring stitches, not believed to be related to current symptoms  Physical activity - Regular gym attendance for weight training and cardio exercises - Prolonged gaming sessions associated with symptom onset       Objective:   There were no vitals filed for this visit.  Left knee (compared to normal) -Inspection: Minimal swelling.  No erythema.  No visible effusion. -Palpation: TTP - quad tendon, - patella, - patellar tendon, - tibial tuberosity, - pes bursa, - gerdy tubercle, - medial joint line, - lateral joint line, - posterior knee, - medial and lateral hamstrings.  moderate crepitus with  flexion/extension. -AROM/PROM: 0 degrees extension, 150 degrees flexion, normal hamstring flexibility -Strength: Mild amount of medial knee movement with single leg squat, 5/5 flexion, 5/5 extension -Special tests:    -ACL: - lachman   -MCL: stable and painless with valgus at 0/30 degrees   -LCL: stable and painless with varus at 0/30 degrees   -PCL: - sag sign   -Meniscus: - thessaly, - McMurray   -Patellofemoral: - patellar grind  Right knee (compared to normal) -Inspection: Mild swelling.  No erythema.  Small visible effusion. -Palpation: TTP - quad tendon, - patella, - patellar tendon, - tibial tuberosity, - pes bursa, - gerdy tubercle, - medial joint line, - lateral joint line, - posterior knee, - medial and lateral hamstrings.  Moderate crepitus with flexion/extension. -AROM/PROM: 0 degrees extension, 145 degrees flexion, normal hamstring flexibility -Strength: Mild to moderate amount of medial knee movement with single leg squat (single-leg squat also reproduces his sensation of tightness in the knee and pain), 5/5 flexion, 5/5 extension -Special tests:    -ACL: - lachman   -MCL: stable and painless with valgus at 0/30 degrees   -LCL: stable and painless with varus at 0/30 degrees   -PCL: - sag sign   -Meniscus: - thessaly, - McMurray   -Patellofemoral: Equivocal patellar grind  Limited ultrasound evaluation of the bilateral knees Right knee with moderate effusion, prominent osteophytic change noted at the inferior patella, prominence versus extrusion of the medial meniscus without discrete tear visualized.  Osteophytic change noted along medial and lateral joint lines Left knee with minimal effusion, prominence versus extrusion  of the medial meniscus, small tearing and calcification present at the proximal portion of the patellar tendon.  Mild infrapatellar bursal enlargement.  Osteophytic change noted along medial and lateral joint lines. Interpretation: Left knee infrapatellar  bursitis, arthritis, minimal effusion, proximal patellar tendinopathy Right knee inferior patellar enthesophyte, arthritis, moderate effusion.    Assessment & Plan:   Assessment & Plan Right knee effusion with patellar tendinopathy, partial quadriceps tendon tear, and bone spur   Mild to moderate effusion in the right knee joint with no clear cause of inflammation or damage. Mild tearing in the quadriceps tendon and a bone spur on the kneecap. These conditions worsen with deep squats, causing tension and stress. The effusion may contribute to tightness and pressure, especially during bending. Prescribe Voltaren  gel or oral anti-inflammatory if tolerated due to GERD. Advise wearing a knee compression sleeve to encourage resolution of effusion. Recommend physical therapy targeting glutes and quadriceps, and to address the partial tendon tear. Advise avoiding deep squats to reduce discomfort. Order x-rays to assess for underlying arthritis. Schedule follow-up in six weeks to assess progress.  Suspected bilateral knee osteoarthritis   Suspected osteoarthritis in both knees, potentially contributing to pain and fluid accumulation. Bone spurs observed, commonly associated with arthritis. Order x-rays to evaluate for osteoarthritis. Discuss potential for arthritis flare and treatment with anti-inflammatory measures.  Left knee infrapatellar bursitis   Mild infrapatellar bursitis in the left knee, possibly due to compensatory weight bearing from previous injury. Not considered the primary source of pain. Include left knee in physical therapy regimen to address bursitis.  Bilateral knee pain and stiffness   Pain and stiffness in both knees, more pronounced in the right knee. Likely related to underlying conditions such as effusion, tendinopathy, and possible osteoarthritis. Prescribe anti-inflammatory treatment as per right knee plan. Recommend physical therapy to address pain and stiffness. Advise use of  knee compression sleeves for both knees.  Follow-Up   Follow-up is necessary to monitor progress and adjust treatment as needed. Insurance coverage for x-rays needs confirmation to proceed with imaging. Schedule follow-up appointment in six weeks. Discuss insurance coverage for x-rays and proceed based on coverage.

## 2024-07-02 ENCOUNTER — Encounter: Payer: Self-pay | Admitting: Family Medicine

## 2024-07-02 DIAGNOSIS — M25562 Pain in left knee: Secondary | ICD-10-CM

## 2024-07-02 DIAGNOSIS — M25561 Pain in right knee: Secondary | ICD-10-CM

## 2024-07-04 NOTE — Telephone Encounter (Signed)
 We can refre to [facility] in [place] for 2nd opinino if he would like

## 2024-07-05 ENCOUNTER — Other Ambulatory Visit: Payer: Self-pay

## 2024-07-05 DIAGNOSIS — M25562 Pain in left knee: Secondary | ICD-10-CM

## 2024-07-05 DIAGNOSIS — M25561 Pain in right knee: Secondary | ICD-10-CM

## 2024-07-05 DIAGNOSIS — M7052 Other bursitis of knee, left knee: Secondary | ICD-10-CM

## 2024-07-05 MED ORDER — NAPROXEN 500 MG PO TABS
500.0000 mg | ORAL_TABLET | Freq: Two times a day (BID) | ORAL | 1 refills | Status: AC
Start: 2024-07-05 — End: ?

## 2024-07-08 NOTE — Addendum Note (Signed)
 Addended by: CHARLES ROGUE A on: 07/08/2024 05:26 PM   Modules accepted: Orders

## 2024-07-10 ENCOUNTER — Ambulatory Visit

## 2024-07-14 ENCOUNTER — Ambulatory Visit (HOSPITAL_BASED_OUTPATIENT_CLINIC_OR_DEPARTMENT_OTHER)
Admission: RE | Admit: 2024-07-14 | Discharge: 2024-07-14 | Disposition: A | Discharge status: Home / Self Care | Source: Ambulatory Visit

## 2024-07-14 DIAGNOSIS — M7052 Other bursitis of knee, left knee: Secondary | ICD-10-CM | POA: Insufficient documentation

## 2024-07-14 DIAGNOSIS — M1711 Unilateral primary osteoarthritis, right knee: Secondary | ICD-10-CM | POA: Insufficient documentation

## 2024-07-14 DIAGNOSIS — M25561 Pain in right knee: Secondary | ICD-10-CM | POA: Diagnosis present

## 2024-07-14 DIAGNOSIS — M25562 Pain in left knee: Secondary | ICD-10-CM | POA: Diagnosis present

## 2024-07-14 DIAGNOSIS — M1712 Unilateral primary osteoarthritis, left knee: Secondary | ICD-10-CM | POA: Insufficient documentation

## 2024-07-27 ENCOUNTER — Ambulatory Visit: Payer: Self-pay

## 2024-08-10 NOTE — Progress Notes (Unsigned)
   Subjective:    Patient ID: Ronald [name], male    DOB: 58 y.o., 07-Mar-1966   MRN: [id number]  Chief Complaint:  Left knee infrapatellar bursitis, patellofemoral arthritis Right knee quad tendon strain, patellofemoral arthritis  Discussed the use of AI scribe software for clinical note transcription with the patient, who gave verbal consent to proceed.  History of Present Illness Ronald [name] is a 57 year old male presenting for 6-week follow-up on bilateral knee pain found to have left knee infrapatellar bursitis, patellofemoral arthritis and right knee partial quadriceps tendon tear, patellofemoral arthritis.  He was initiated on physical therapy, a script for naproxen , and provided a work note allowing for a short time of rest from his work.  Knee pain and swelling - Right knee pain and swelling greater than left, with right described as worse - Pain rated 2/10, increases with deep squats and bending - Swelling has decreased, with approximately 50% overall improvement - Mild sensitivity in the right knee without significant pain elsewhere - Left knee has milder discomfort with bending  Mechanical symptoms - Both knees pop with bending, more pronounced on the right  Functional status - Improved flexibility and strength in both knees - Able to kneel with less difficulty  Symptom management - Not taking naproxen  due to concern about alcohol interaction and mild pain - Utilizes elevation, icing, and exercises, which improve symptoms     Objective:   Vitals:   08/11/24 1320  BP: (!) 130/90   Left knee: Minimal to mild effusion Nontender to palpation along medial and lateral joint lines Nontender over the tibial tuberosity, patellar tendon, quadriceps tendon  Right knee: Mild to moderate effusion Mild tenderness to palpation along the medial joint line Nontender over the tibial tuberosity, patellar tendon, quadriceps tendon    Assessment & Plan:   Assessment &  Plan Bilateral knee osteoarthritis with right knee effusion and left infrapatellar bursitis   Symptoms have improved with reduced pain and increased flexibility. Right knee effusion persists with manageable swelling. Left knee infrapatellar bursitis is likely worsened by kneeling. Biomechanics and exercises have enhanced strength and flexibility. He is not using naproxen  due to side effect concerns. Ice and compression are beneficial. Consider injection if symptoms worsen or do not improve in 6-8 weeks. Continue current exercise regimen to improve biomechanics and strength. Consider Voltaren  gel for localized pain relief if needed. Use ice and compression to manage swelling. Adopt a Mediterranean diet to reduce inflammation. Follow-up in 6 weeks to reassess symptoms and consider injection if necessary.

## 2024-08-11 ENCOUNTER — Ambulatory Visit

## 2024-08-11 VITALS — BP 130/90 | Ht 71.0 in | Wt 192.0 lb

## 2024-08-11 DIAGNOSIS — M1712 Unilateral primary osteoarthritis, left knee: Secondary | ICD-10-CM | POA: Diagnosis not present

## 2024-08-11 DIAGNOSIS — S76111S Strain of right quadriceps muscle, fascia and tendon, sequela: Secondary | ICD-10-CM

## 2024-08-11 DIAGNOSIS — M1711 Unilateral primary osteoarthritis, right knee: Secondary | ICD-10-CM | POA: Diagnosis not present

## 2024-08-11 DIAGNOSIS — M7052 Other bursitis of knee, left knee: Secondary | ICD-10-CM

## 2024-09-22 ENCOUNTER — Ambulatory Visit
